# Patient Record
Sex: Male | Born: 1965 | Race: White | Hispanic: No | Marital: Single | Smoking: Current every day smoker
Health system: Southern US, Community
[De-identification: ages and names within clinical notes are randomized; demographics above are authoritative.]

## PROBLEM LIST (undated history)

## (undated) DIAGNOSIS — E669 Obesity, unspecified: Secondary | ICD-10-CM

## (undated) DIAGNOSIS — K219 Gastro-esophageal reflux disease without esophagitis: Secondary | ICD-10-CM

## (undated) DIAGNOSIS — F1010 Alcohol abuse, uncomplicated: Secondary | ICD-10-CM

## (undated) DIAGNOSIS — I2510 Atherosclerotic heart disease of native coronary artery without angina pectoris: Secondary | ICD-10-CM

## (undated) DIAGNOSIS — I5042 Chronic combined systolic (congestive) and diastolic (congestive) heart failure: Secondary | ICD-10-CM

## (undated) DIAGNOSIS — I214 Non-ST elevation (NSTEMI) myocardial infarction: Secondary | ICD-10-CM

## (undated) DIAGNOSIS — M109 Gout, unspecified: Secondary | ICD-10-CM

## (undated) DIAGNOSIS — I255 Ischemic cardiomyopathy: Secondary | ICD-10-CM

## (undated) DIAGNOSIS — I10 Essential (primary) hypertension: Secondary | ICD-10-CM

## (undated) DIAGNOSIS — Z720 Tobacco use: Secondary | ICD-10-CM

## (undated) HISTORY — DX: Alcohol abuse, uncomplicated: F10.10

## (undated) HISTORY — DX: Chronic combined systolic (congestive) and diastolic (congestive) heart failure: I50.42

## (undated) HISTORY — PX: TOOTH EXTRACTION: SUR596

## (undated) HISTORY — DX: Ischemic cardiomyopathy: I25.5

---

## 2007-07-27 ENCOUNTER — Ambulatory Visit: Payer: Self-pay | Admitting: Internal Medicine

## 2007-07-27 ENCOUNTER — Inpatient Hospital Stay (HOSPITAL_COMMUNITY): Admission: EM | Admit: 2007-07-27 | Discharge: 2007-07-29 | Payer: Self-pay | Admitting: Emergency Medicine

## 2007-07-29 ENCOUNTER — Encounter (INDEPENDENT_AMBULATORY_CARE_PROVIDER_SITE_OTHER): Payer: Self-pay | Admitting: Emergency Medicine

## 2009-12-12 IMAGING — CR DG CHEST 2V
1 series · 1 of 1 positions shown · non-contrast
Comparison: None

CLINICAL DATA: Chest pain, shortness of breath

CHEST - 2 VIEW:

[w chest lat]
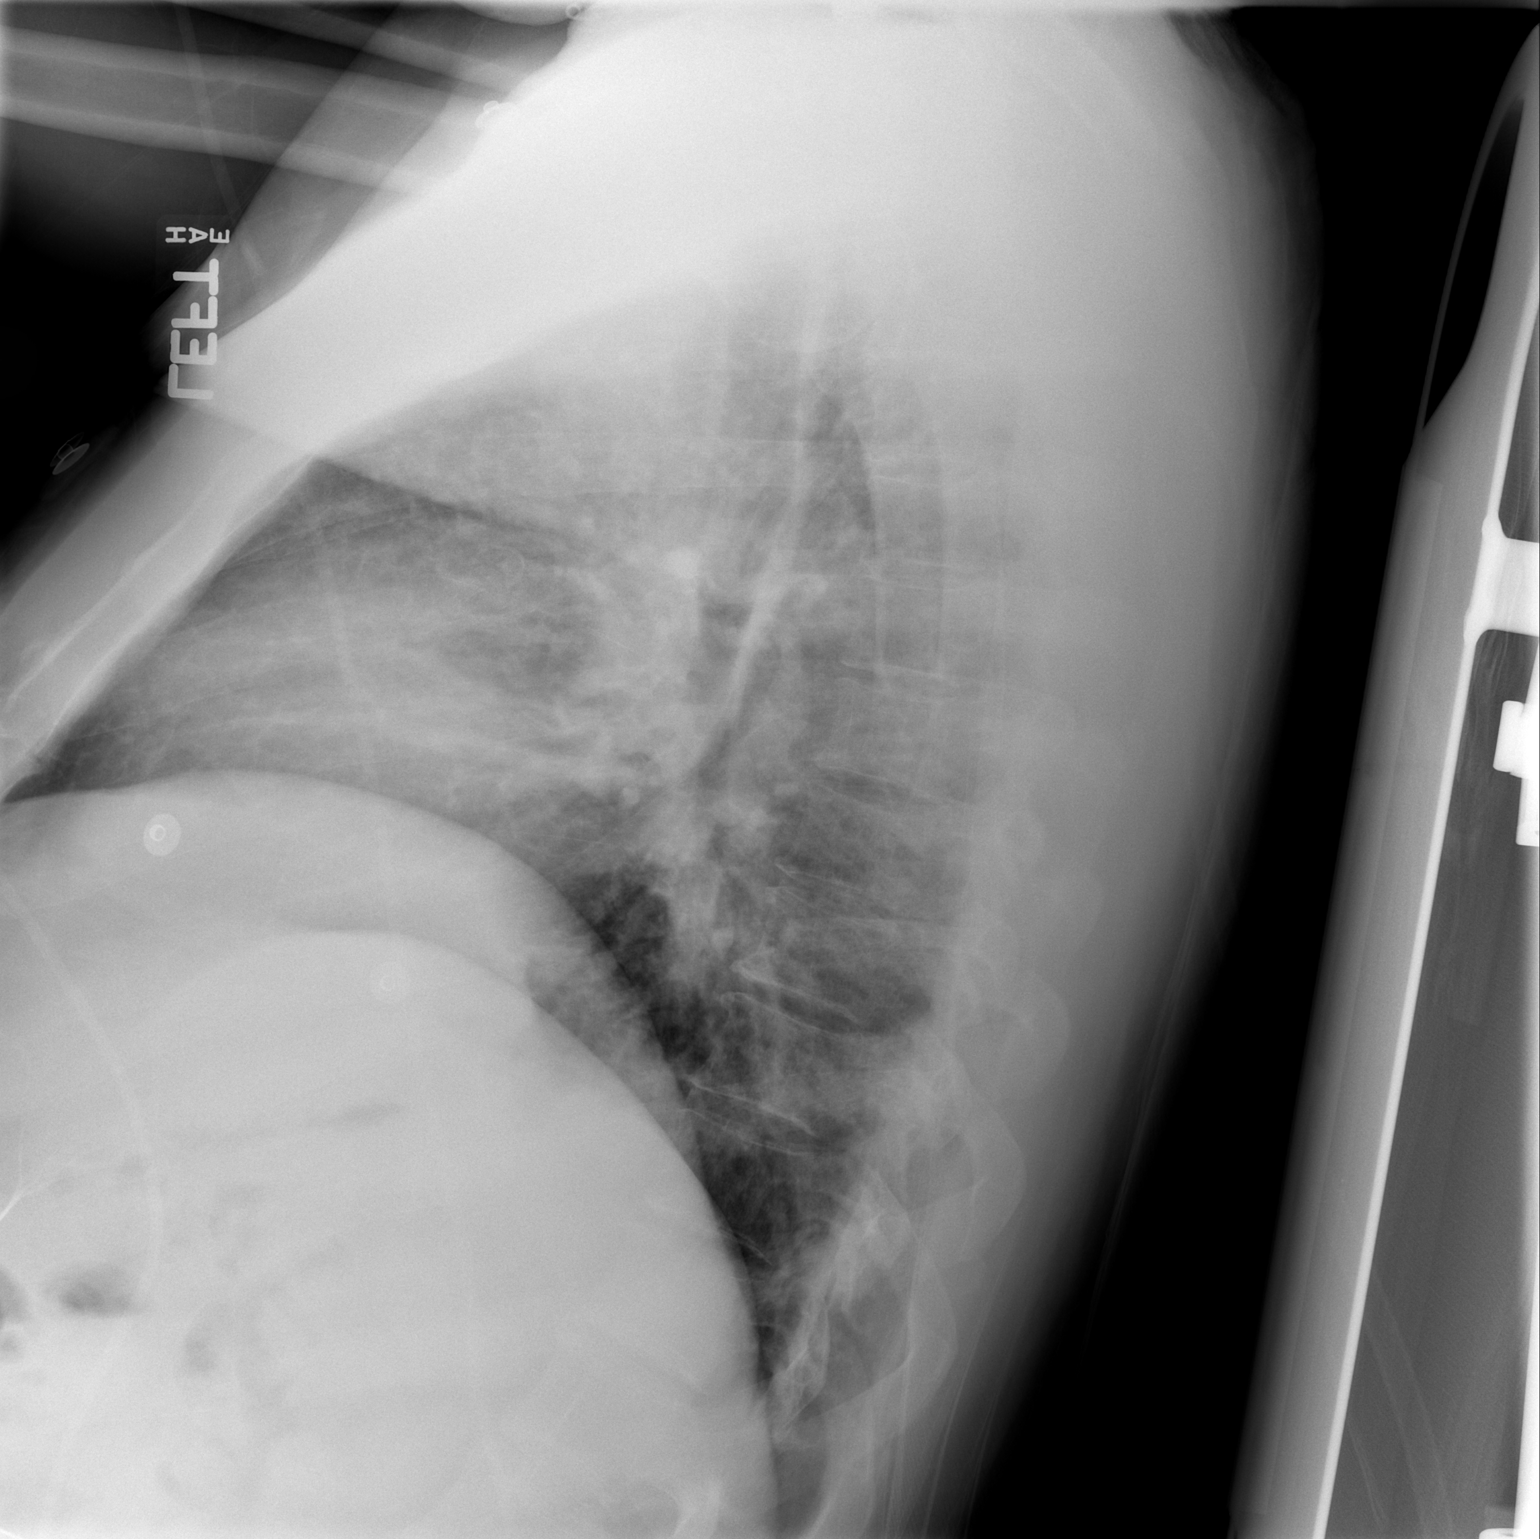

[1 of 1 positions shown; findings below may reference images not displayed]

FINDINGS: Heart and mediastinal contours are within normal limits. Lungs are
clear. No focal opacities or effusions. Low lung volumes. No acute bony
abnormality.
IMPRESSION: No active disease. Low volumes.

## 2010-10-07 NOTE — Discharge Summary (Signed)
Bradley Bridges, POSTEN NO.:  [id number]   MEDICAL RECORD NO.:  [id number]          PATIENT TYPE:  INP   LOCATION:  4735                         FACILITY:  [facility]   PHYSICIAN:  Alvester Morin, M.D.  DATE OF BIRTH:  [birth year]   DATE OF ADMISSION:  07/27/2007  DATE OF DISCHARGE:  07/29/2007                               DISCHARGE SUMMARY   DISCHARGE DIAGNOSES:  1. Chest pain secondary to pneumonia.  2. Prolonged QT.  3. Polysubstance abuse including alcohol, tobacco, and cocaine.  4. Hypertension.  5. Dyslipidemia.  6. Gastroesophageal reflux disease.   DISCHARGE MEDICATIONS:  Amlodipine 10 mg 1 tablet by mouth daily,  hydrochlorothiazide 25 mg 1 tablet by mouth daily, Prilosec 20 mg 1  tablet by mouth twice a day, doxycycline 100 mg 1 tablet by mouth twice  a day for 14 days.   DISPOSITION ON FOLLOWUP:  The patient was discharged home in stable  condition with a followup by his primary care physician, Dr. Loma Sender, on August 12, 2007, at 11:00 a.m.  Dr. Vear Clock' office phone  number is [phone].  It will be important to check the bottles of  the medications that the patient is using in order to confirm that he is  taking the appropriate medication and the appropriate doses.  A BMET  should be checked to in order to evaluate renal function and  electrolytes status since the patient was recently started on  hydrochlorothiazide.  Blood pressure should be checked and add another  medication if necessary.  It will be important also to examine him and  check that the patient completes the antibiotic course.   PROCEDURES PERFORMED:  Acute abdominal series demonstrated new left  basilar opacity compatible with pneumonia.  No obstruction or free air  noticed in the abdomen.  Chest x-ray showed  clear lungs.  2D ECHO showed normal left ventricular systolic function.  Ventricular  ejection fraction 55%.  There was no diagnostic evidence of left  ventricular regional wall motion abnormalities.  Left ventricle wall  thickness was mildly increased consistently with hypertension.  There  was mild asymmetric septal hypertrophy also consistent with  hypertension.   CONSULTATIONS:  No consultations were made during this admission.   HISTORY OF PRESENT ILLNESS AND PHYSICAL EXAM AND LABORATORY DATA:  Mr.  Bridges is a 45 years old male with prior medical history of  hypertension, hyperlipidemia, tobacco abuse, alcohol abuse,  gastroesophageal reflux disease, and a family history of myocardial  infarction before age 22 who came to the emergency department  complaining of chest pain, 10/10 in intensity, constant, localized on  his left lower part of the chest, nonradiating, aggravated by deep  inspiration, alleviated by nitroglycerin. The pain was started the night  prior to admission after an episode of emesis.  The patient reports that  the pain is associated with diaphoresis, nausea, and vomiting.  The  patient also endorses feeling cold symptoms for the last 5 days prior to  admission including fever, cough, myalgias, and general malaise.  He has  been using Tylenol, NyQuil, and DayQuil  to treat the symptoms without  any apparent improvement of them.   PHYSICAL EXAMINATION:  VITAL SIGNS:  Temperature of 108, blood pressure  129/76, heart rate 96, respiratory rate 18, oxygen saturation 94% on  room air, 97% on 2L.  GENERAL:  The patient was in no acute distress.  HEENT:  Eyes PERRLA.  No icterus.  Extraocular muscles intact.  ENT  showed edematous oropharynx without exudates and dry oral mucous  membrane.  NECK:  Supple, without thyromegaly.  No bruits.  RESPIRATORY:  Clear to auscultation bilateral with mild decreased breath  sound diffusely especially towards bases.  CARDIOVASCULAR:  Regular rate and rhythm.  No murmurs, gallops, or rubs.  GASTROINTESTINAL:  Soft, a little bit obese.  Nontender and  nondistended.  Positive bowel  sounds.  EXTREMITIES:  Without edema, cyanosis, or clubbing.  Good pulses  bilateral and symmetrical.  SKIN:  The patient was a little bit flush especially on his face, but no  lesion or rash were noted.  No lymphadenopathy present, 5/5 in all  extremities regarding strength with full range of motion.  NEUROLOGIC:  The patient was alert, awake, and oriented x3.  No cranial  nerve deficit on the exam.  Appropriate affect and normal finger-to-  nose.   LABORATORY DATA:  Sodium of 136, potassium 3.9, chloride 106, bicarb 21,  BUN 5, glucose 105, creatinine 1.3, and anion gap 9.  White blood cells  13.6, hemoglobin 16.0, hematocrit 44.7, platelets 189, ANC 11.4, MCV  95.5.  Cardiac markers point of care demonstrated a myoglobin of 150, CK-  MB less than 1, troponin less than 0.05.   Chest x-ray, no acute disease.  Alcohol level was less than on 5,  influenza A and B was negative.  The patient's UDS was positive for  opioids and positive for cocaine.  TSH 1.204, hemoglobin A1c is 5.8.  Lipid profile demonstrating a cholesterol of 151 total, triglycerides of  137,  HDL of 29 and LDL of 95.  D-dimer 0.22, bilirubin 1.1, alkaline  phosphatase 47, AST 67, ALT 48, total protein 6.7, albumin 3.3, calcium  8.3, and lipase 26.   HOSPITAL COURSE BY PROBLEM:  1. Chest pain, secondary to pneumonia.  The patient was admitted to      telemetry.  Cardiac enzymes and troponins were negative.  Serial      EKGs showed no findings significant for ischemia.  D-dimer was      negative.  2D ECHO showed no regional wall abnormality (results      above).  Repeat CXR the day after admission showed a left lower      lobe pneumonia. The patient was started on Rocephin and Zithromax      and he improved symptomatically. He was discharged on doxycycline      100 mg twice a day for 14 days.  2. Polysubstance abuse.  The patient received smoking cessation      counseling.  He also received a nicotine patch after  ruling out      coronary artery disease.  He also received alcohol and cocaine      cessation counseling.  He received thiamine and folic acid during      the hospitalization.  At the moment of discharge, the patient was      instructed to continue with the multivitamins, and he received      complete information package in order for him to attend AA      meetings.  3. Hypertension.  The patient was on no blood pressure medication on      admission. He had elevated blood pressures throughout his      hospitalization and was started on amlodipine and      hydrochlorothiazide.  He will followup with his primary care      physician in order to adjust the doses and add any other medication      needed to control his blood pressure.  4. Gastroesophageal reflux disease.  The patient was placed on      Protonix 40 mg daily and then he was discharged using Prilosec 20      mg twice a day in order to avoid any reflux symptoms.  5. Dyslipidemia.  Fasting lipid profile showed an HDL of 29.  He will      follow up with his primary care physician to discuss treatment.  6. Fecal occult blood test positive  Patient will need a colonoscopy      as an outpatient.   At discharge, the patient's vital signs were blood pressure 150/110,  oxygen saturation 98, heart rate 82, respiratory rate 19, and  temperature 98.3.  At the moment of discharge his lab were sodium 132,  potassium 3.8, chloride 108, bicarbonate 24, glucose 83, BUN 5,  creatinine 1.02, and calcium 9.1.      Rosanna Randy, MD  Electronically Signed      Alvester Morin, M.D.  Electronically Signed    CEM/MEDQ  D:  08/22/2007  T:  08/22/2007  Job:  [id number]   cc:   Loma Sender

## 2011-02-13 LAB — CBC
HCT: 40.1
HCT: 44.7
Hemoglobin: 13.8
MCHC: 35.2
MCV: 96.7
Platelets: 147 — ABNORMAL LOW
Platelets: 189
RBC: 3.93 — ABNORMAL LOW
RDW: 13.7
RDW: 13.8
WBC: 13.6 — ABNORMAL HIGH

## 2011-02-13 LAB — RAPID URINE DRUG SCREEN, HOSP PERFORMED
Amphetamines: NOT DETECTED
Benzodiazepines: NOT DETECTED
Cocaine: POSITIVE — AB
Tetrahydrocannabinol: NOT DETECTED

## 2011-02-13 LAB — DIFFERENTIAL
Basophils Absolute: 0
Eosinophils Relative: 0
Lymphocytes Relative: 23
Lymphocytes Relative: 6 — ABNORMAL LOW
Lymphs Abs: 0.8
Monocytes Absolute: 0.6
Neutro Abs: 11.4 — ABNORMAL HIGH
Neutrophils Relative %: 84 — ABNORMAL HIGH

## 2011-02-13 LAB — URINALYSIS, MICROSCOPIC ONLY
Ketones, ur: NEGATIVE
Leukocytes, UA: NEGATIVE
Nitrite: NEGATIVE
Protein, ur: NEGATIVE
Urobilinogen, UA: 1

## 2011-02-13 LAB — EXPECTORATED SPUTUM ASSESSMENT W GRAM STAIN, RFLX TO RESP C

## 2011-02-13 LAB — COMPREHENSIVE METABOLIC PANEL
ALT: 48
Albumin: 3.3 — ABNORMAL LOW
Alkaline Phosphatase: 47
BUN: 5 — ABNORMAL LOW
Chloride: 105
Glucose, Bld: 106 — ABNORMAL HIGH
Potassium: 4.1
Sodium: 139
Total Bilirubin: 1.1
Total Protein: 6.7

## 2011-02-13 LAB — CULTURE, BLOOD (ROUTINE X 2): Culture: NO GROWTH

## 2011-02-13 LAB — BASIC METABOLIC PANEL
BUN: 9
CO2: 24
CO2: 24
Calcium: 8.6
Creatinine, Ser: 1.21
GFR calc Af Amer: >60
GFR calc non Af Amer: >60
Glucose, Bld: 83
Potassium: 3.8
Sodium: 142

## 2011-02-13 LAB — POCT CARDIAC MARKERS
CKMB, poc: <1 — ABNORMAL LOW
Myoglobin, poc: 150
Operator id: 295131
Troponin i, poc: <0.05

## 2011-02-13 LAB — HEMOGLOBIN A1C: Hgb A1c MFr Bld: 5.8

## 2011-02-13 LAB — I-STAT 8, (EC8 V) (CONVERTED LAB)
BUN: 5 — ABNORMAL LOW
Bicarbonate: 20.8
Glucose, Bld: 105 — ABNORMAL HIGH
TCO2: 22
pH, Ven: 7.41 — ABNORMAL HIGH

## 2011-02-13 LAB — ETHANOL: Alcohol, Ethyl (B): 5

## 2011-02-13 LAB — LIPID PANEL
HDL: 29 — ABNORMAL LOW
Total CHOL/HDL Ratio: 5.2
VLDL: 27

## 2011-02-13 LAB — CK TOTAL AND CKMB (NOT AT ARMC)
CK, MB: 1.5
Relative Index: 0.8
Total CK: 152

## 2011-02-13 LAB — INFLUENZA A+B VIRUS AG-DIRECT(RAPID)
Inflenza A Ag: NEGATIVE
Influenza B Ag: NEGATIVE

## 2011-02-13 LAB — CULTURE, RESPIRATORY W GRAM STAIN: Culture: NORMAL

## 2011-02-13 LAB — URINE CULTURE: Colony Count: 10000

## 2011-02-13 LAB — TSH: TSH: 1.204

## 2011-02-13 LAB — POCT I-STAT CREATININE: Operator id: 133351

## 2011-02-13 LAB — PROTIME-INR
INR: 1
Prothrombin Time: 13.7

## 2011-02-13 LAB — OCCULT BLOOD X 1 CARD TO LAB, STOOL: Fecal Occult Bld: POSITIVE

## 2011-02-13 LAB — TROPONIN I: Troponin I: 0.03

## 2014-05-02 ENCOUNTER — Emergency Department: Payer: Self-pay | Admitting: Emergency Medicine

## 2014-05-02 LAB — CBC
HCT: 45.5 % (ref 40.0–52.0)
HGB: 15.4 g/dL (ref 13.0–18.0)
MCH: 33 pg (ref 26.0–34.0)
MCHC: 33.8 g/dL (ref 32.0–36.0)
MCV: 98 fL (ref 80–100)
PLATELETS: 511 10*3/uL — AB (ref 150–440)
RBC: 4.66 10*6/uL (ref 4.40–5.90)
RDW: 13.2 % (ref 11.5–14.5)
WBC: 14.1 10*3/uL — AB (ref 3.8–10.6)

## 2014-05-02 LAB — BASIC METABOLIC PANEL
ANION GAP: 10 (ref 7–16)
BUN: 10 mg/dL (ref 7–18)
CALCIUM: 9.5 mg/dL (ref 8.5–10.1)
CHLORIDE: 99 mmol/L (ref 98–107)
CO2: 26 mmol/L (ref 21–32)
Creatinine: 1.09 mg/dL (ref 0.60–1.30)
GLUCOSE: 95 mg/dL (ref 65–99)
Osmolality: 269 (ref 275–301)
POTASSIUM: 3.4 mmol/L — AB (ref 3.5–5.1)
Sodium: 135 mmol/L — ABNORMAL LOW (ref 136–145)

## 2014-05-02 LAB — TROPONIN I: Troponin-I: <0.02 ng/mL

## 2014-05-02 LAB — PRO B NATRIURETIC PEPTIDE: B-Type Natriuretic Peptide: 138 pg/mL — ABNORMAL HIGH (ref 0–125)

## 2014-05-03 LAB — HEPATIC FUNCTION PANEL A (ARMC)
ALBUMIN: 3.4 g/dL (ref 3.4–5.0)
AST: 28 U/L (ref 15–37)
Alkaline Phosphatase: 98 U/L
Bilirubin, Direct: 0.6 mg/dL — ABNORMAL HIGH (ref 0.0–0.2)
Bilirubin,Total: 1.2 mg/dL — ABNORMAL HIGH (ref 0.2–1.0)
SGPT (ALT): 15 U/L
TOTAL PROTEIN: 8.1 g/dL (ref 6.4–8.2)

## 2014-05-03 LAB — ETHANOL: Ethanol: <3 mg/dL

## 2014-05-03 LAB — TROPONIN I: Troponin-I: <0.02 ng/mL

## 2014-05-03 LAB — LIPASE, BLOOD: LIPASE: 227 U/L (ref 73–393)

## 2016-09-17 IMAGING — CR DG CHEST 2V
1 series · 2 of 2 positions shown · non-contrast
Comparison: None.

CLINICAL DATA: Chest pain for 1 day.  Shortness of breath.  Cough

EXAM:
CHEST  2 VIEW

[Series 1: dxr chest pa (or ap) and lateral · 0.14mm/px · 2 of 2 slices shown]
[im 1/2]
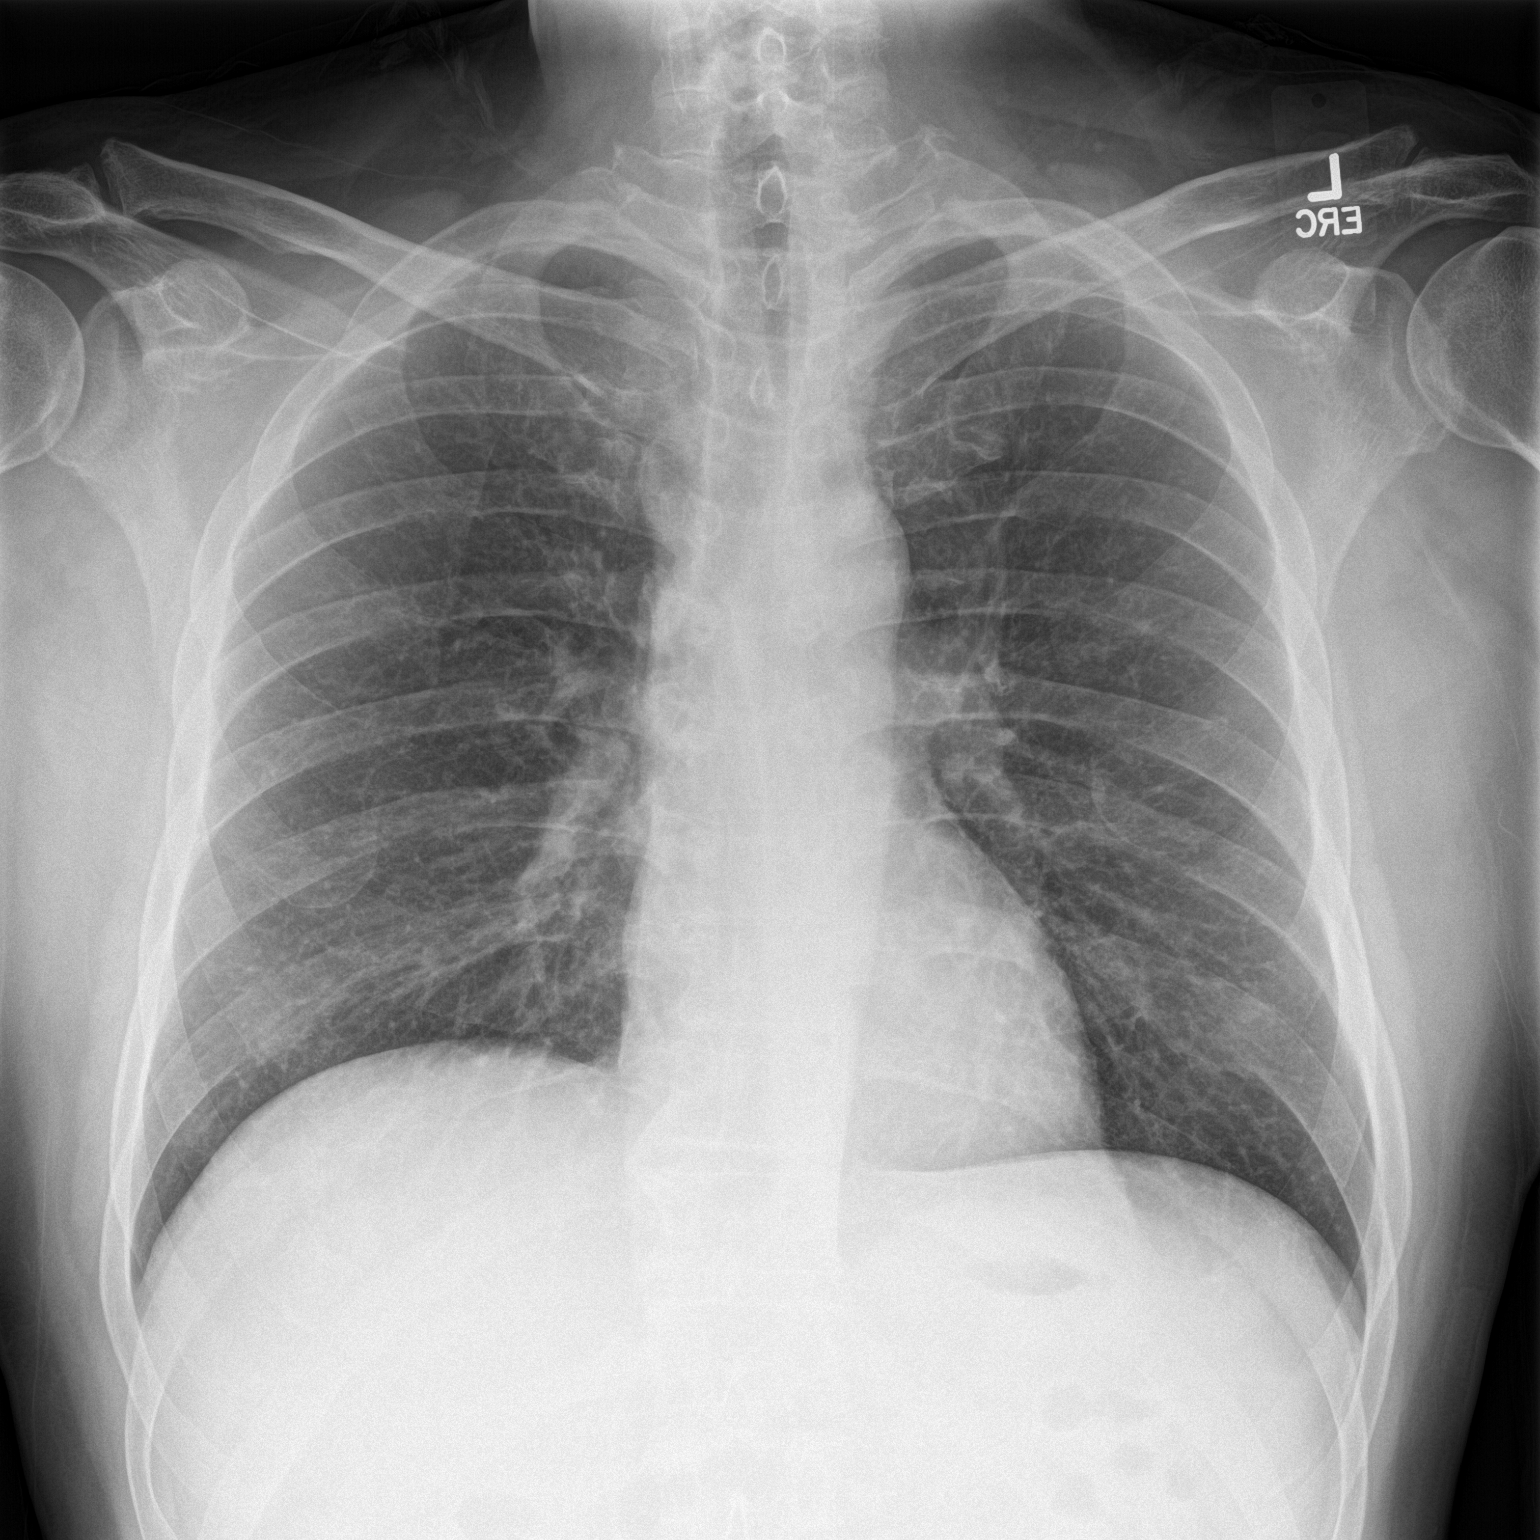
[im 2/2]
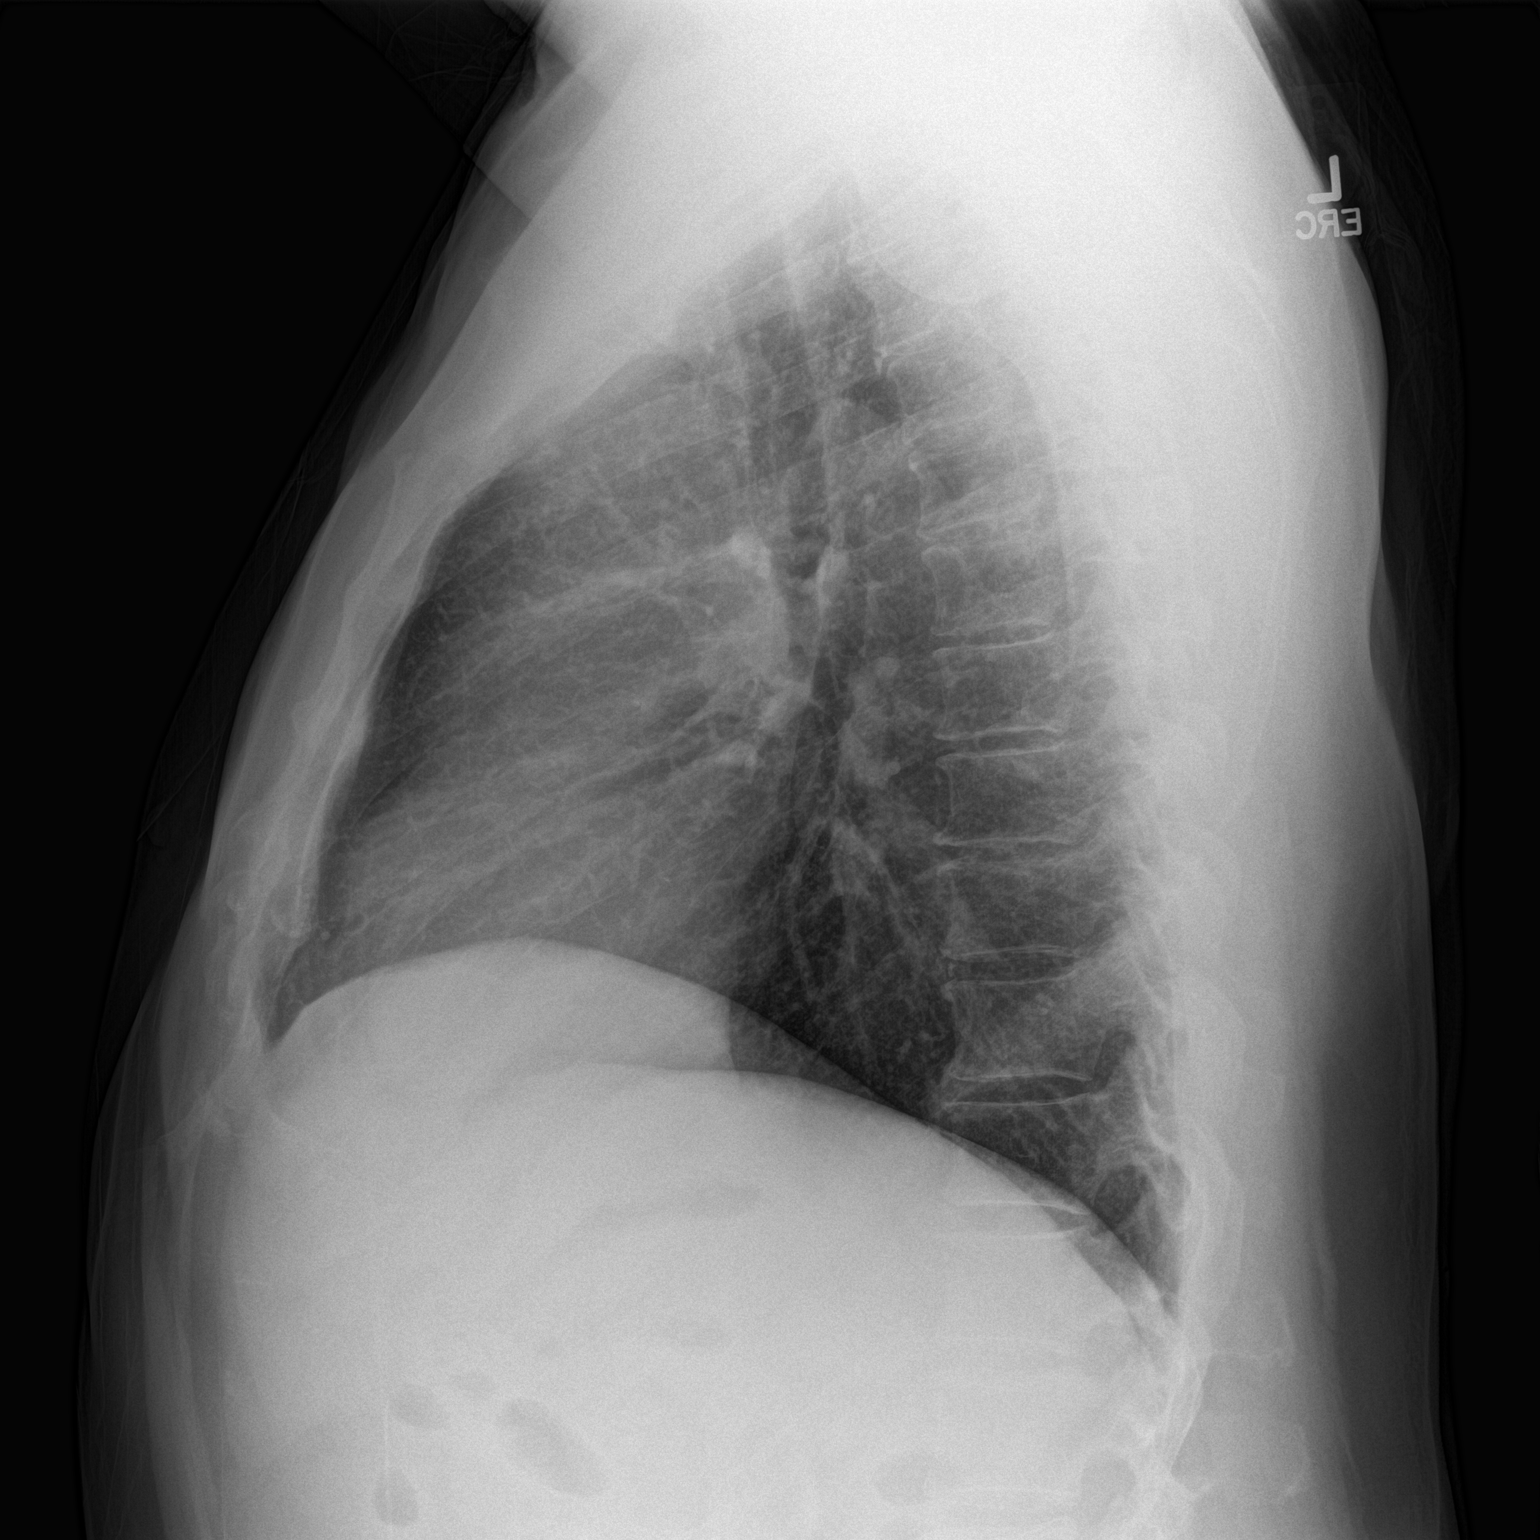

[2 of 2 positions shown; findings below may reference images not displayed]

FINDINGS: The heart size and mediastinal contours are within normal limits.
Both lungs are clear. The visualized skeletal structures are
unremarkable.
IMPRESSION: No active cardiopulmonary disease.

## 2017-02-26 ENCOUNTER — Inpatient Hospital Stay
Admission: EM | Admit: 2017-02-26 | Discharge: 2017-02-27 | DRG: 281 | Disposition: A | Discharge status: Other Acute Inpatient Hospital | Payer: Medicaid Other | Attending: Internal Medicine | Admitting: Internal Medicine

## 2017-02-26 ENCOUNTER — Encounter: Payer: Self-pay | Admitting: *Deleted

## 2017-02-26 ENCOUNTER — Emergency Department: Payer: Medicaid Other

## 2017-02-26 DIAGNOSIS — D72829 Elevated white blood cell count, unspecified: Secondary | ICD-10-CM | POA: Diagnosis present

## 2017-02-26 DIAGNOSIS — F17210 Nicotine dependence, cigarettes, uncomplicated: Secondary | ICD-10-CM | POA: Diagnosis present

## 2017-02-26 DIAGNOSIS — Z6831 Body mass index (BMI) 31.0-31.9, adult: Secondary | ICD-10-CM | POA: Diagnosis not present

## 2017-02-26 DIAGNOSIS — I214 Non-ST elevation (NSTEMI) myocardial infarction: Secondary | ICD-10-CM | POA: Diagnosis present

## 2017-02-26 DIAGNOSIS — F1010 Alcohol abuse, uncomplicated: Secondary | ICD-10-CM | POA: Diagnosis present

## 2017-02-26 DIAGNOSIS — N179 Acute kidney failure, unspecified: Secondary | ICD-10-CM | POA: Diagnosis present

## 2017-02-26 DIAGNOSIS — R1013 Epigastric pain: Secondary | ICD-10-CM | POA: Diagnosis not present

## 2017-02-26 DIAGNOSIS — R0602 Shortness of breath: Secondary | ICD-10-CM

## 2017-02-26 DIAGNOSIS — E669 Obesity, unspecified: Secondary | ICD-10-CM | POA: Diagnosis present

## 2017-02-26 DIAGNOSIS — Z880 Allergy status to penicillin: Secondary | ICD-10-CM

## 2017-02-26 DIAGNOSIS — Z8249 Family history of ischemic heart disease and other diseases of the circulatory system: Secondary | ICD-10-CM | POA: Diagnosis not present

## 2017-02-26 DIAGNOSIS — M109 Gout, unspecified: Secondary | ICD-10-CM | POA: Diagnosis present

## 2017-02-26 DIAGNOSIS — I10 Essential (primary) hypertension: Secondary | ICD-10-CM | POA: Diagnosis present

## 2017-02-26 DIAGNOSIS — K219 Gastro-esophageal reflux disease without esophagitis: Secondary | ICD-10-CM | POA: Diagnosis present

## 2017-02-26 DIAGNOSIS — Z833 Family history of diabetes mellitus: Secondary | ICD-10-CM | POA: Diagnosis not present

## 2017-02-26 HISTORY — DX: Gastro-esophageal reflux disease without esophagitis: K21.9

## 2017-02-26 HISTORY — DX: Essential (primary) hypertension: I10

## 2017-02-26 HISTORY — DX: Obesity, unspecified: E66.9

## 2017-02-26 HISTORY — DX: Gout, unspecified: M10.9

## 2017-02-26 HISTORY — DX: Tobacco use: Z72.0

## 2017-02-26 LAB — BASIC METABOLIC PANEL
ANION GAP: 12 (ref 5–15)
BUN: 17 mg/dL (ref 6–20)
CO2: 27 mmol/L (ref 22–32)
Calcium: 9.9 mg/dL (ref 8.9–10.3)
Chloride: 95 mmol/L — ABNORMAL LOW (ref 101–111)
Creatinine, Ser: 1.31 mg/dL — ABNORMAL HIGH (ref 0.61–1.24)
GLUCOSE: 134 mg/dL — AB (ref 65–99)
Potassium: 5 mmol/L (ref 3.5–5.1)
SODIUM: 134 mmol/L — AB (ref 135–145)

## 2017-02-26 LAB — URINALYSIS, COMPLETE (UACMP) WITH MICROSCOPIC
BACTERIA UA: NONE SEEN
BILIRUBIN URINE: NEGATIVE
GLUCOSE, UA: NEGATIVE mg/dL
HGB URINE DIPSTICK: NEGATIVE
Ketones, ur: NEGATIVE mg/dL
LEUKOCYTES UA: NEGATIVE
NITRITE: NEGATIVE
PH: 5 (ref 5.0–8.0)
Protein, ur: 100 mg/dL — AB
SPECIFIC GRAVITY, URINE: 1.023 (ref 1.005–1.030)
Squamous Epithelial / LPF: NONE SEEN

## 2017-02-26 LAB — LIPID PANEL
CHOL/HDL RATIO: 4.5 ratio
CHOLESTEROL: 196 mg/dL (ref 0–200)
HDL: 44 mg/dL (ref >40)
LDL CALC: 108 mg/dL — AB (ref 0–99)
Triglycerides: 218 mg/dL — ABNORMAL HIGH (ref <150)
VLDL: 44 mg/dL — AB (ref 0–40)

## 2017-02-26 LAB — HEPARIN LEVEL (UNFRACTIONATED)

## 2017-02-26 LAB — TROPONIN I
Troponin I: 13.83 ng/mL (ref <0.03)
Troponin I: 16.08 ng/mL (ref <0.03)

## 2017-02-26 LAB — CBC
HCT: 42.5 % (ref 40.0–52.0)
HEMOGLOBIN: 14.4 g/dL (ref 13.0–18.0)
MCH: 31.1 pg (ref 26.0–34.0)
MCHC: 33.9 g/dL (ref 32.0–36.0)
MCV: 91.7 fL (ref 80.0–100.0)
Platelets: 383 10*3/uL (ref 150–440)
RBC: 4.63 MIL/uL (ref 4.40–5.90)
RDW: 13 % (ref 11.5–14.5)
WBC: 17.1 10*3/uL — AB (ref 3.8–10.6)

## 2017-02-26 LAB — URINE DRUG SCREEN, QUALITATIVE (ARMC ONLY)
Amphetamines, Ur Screen: NOT DETECTED
BARBITURATES, UR SCREEN: NOT DETECTED
BENZODIAZEPINE, UR SCRN: NOT DETECTED
Cannabinoid 50 Ng, Ur ~~LOC~~: NOT DETECTED
Cocaine Metabolite,Ur ~~LOC~~: NOT DETECTED
MDMA (Ecstasy)Ur Screen: NOT DETECTED
METHADONE SCREEN, URINE: NOT DETECTED
OPIATE, UR SCREEN: POSITIVE — AB
Phencyclidine (PCP) Ur S: NOT DETECTED
TRICYCLIC, UR SCREEN: NOT DETECTED

## 2017-02-26 LAB — PROTIME-INR
INR: 1.08
Prothrombin Time: 13.9 seconds (ref 11.4–15.2)

## 2017-02-26 LAB — APTT: aPTT: 33 seconds (ref 24–36)

## 2017-02-26 MED ORDER — HYDRALAZINE HCL 20 MG/ML IJ SOLN: 10.0000 mg | Freq: Four times a day (QID) | INTRAMUSCULAR | Status: DC | PRN

## 2017-02-26 MED ORDER — NITROGLYCERIN 2 % TD OINT
1.0000 [in_us] | TOPICAL_OINTMENT | Freq: Four times a day (QID) | TRANSDERMAL | Status: DC
  Administered 2017-02-26: 1 [in_us] via TOPICAL
  Filled 2017-02-26: qty 1

## 2017-02-26 MED ORDER — METOPROLOL TARTRATE 25 MG PO TABS
25.0000 mg | ORAL_TABLET | Freq: Two times a day (BID) | ORAL | Status: DC
  Administered 2017-02-26 – 2017-02-27 (×2): 25 mg via ORAL
  Filled 2017-02-26 (×2): qty 1

## 2017-02-26 MED ORDER — ATORVASTATIN CALCIUM 20 MG PO TABS
40.0000 mg | ORAL_TABLET | Freq: Every day | ORAL | Status: DC
  Administered 2017-02-26: 40 mg via ORAL
  Filled 2017-02-26: qty 2

## 2017-02-26 MED ORDER — ONDANSETRON HCL 4 MG/2ML IJ SOLN: 4.0000 mg | Freq: Once | INTRAMUSCULAR | Status: DC

## 2017-02-26 MED ORDER — MORPHINE SULFATE (PF) 2 MG/ML IV SOLN: 2.0000 mg | INTRAVENOUS | Status: DC | PRN

## 2017-02-26 MED ORDER — ASPIRIN 81 MG PO CHEW
81.0000 mg | CHEWABLE_TABLET | Freq: Every day | ORAL | Status: DC
Start: 2017-02-27 — End: 2017-02-27
  Administered 2017-02-27: 81 mg via ORAL
  Filled 2017-02-26: qty 1

## 2017-02-26 MED ORDER — ONDANSETRON HCL 4 MG/2ML IJ SOLN: 4.0000 mg | Freq: Four times a day (QID) | INTRAMUSCULAR | Status: DC | PRN

## 2017-02-26 MED ORDER — ONDANSETRON HCL 4 MG PO TABS: 4.0000 mg | ORAL_TABLET | Freq: Four times a day (QID) | ORAL | Status: DC | PRN

## 2017-02-26 MED ORDER — ACETAMINOPHEN 650 MG RE SUPP: 650.0000 mg | Freq: Four times a day (QID) | RECTAL | Status: DC | PRN

## 2017-02-26 MED ORDER — MORPHINE SULFATE (PF) 4 MG/ML IV SOLN
4.0000 mg | Freq: Once | INTRAVENOUS | Status: AC
  Administered 2017-02-26: 4 mg via INTRAVENOUS
  Filled 2017-02-26: qty 1

## 2017-02-26 MED ORDER — ASPIRIN 81 MG PO CHEW
324.0000 mg | CHEWABLE_TABLET | Freq: Once | ORAL | Status: AC
  Administered 2017-02-26: 324 mg via ORAL
  Filled 2017-02-26: qty 4

## 2017-02-26 MED ORDER — SODIUM CHLORIDE 0.9 % IV SOLN
INTRAVENOUS | Status: DC
  Administered 2017-02-26: 21:00:00 via INTRAVENOUS

## 2017-02-26 MED ORDER — HEPARIN (PORCINE) IN NACL 100-0.45 UNIT/ML-% IJ SOLN
1600.0000 [IU]/h | INTRAMUSCULAR | Status: DC
  Administered 2017-02-26: 1400 [IU]/h via INTRAVENOUS
  Administered 2017-02-27: 1600 [IU]/h via INTRAVENOUS
  Filled 2017-02-26 (×3): qty 250

## 2017-02-26 MED ORDER — NITROGLYCERIN 0.4 MG SL SUBL
0.4000 mg | SUBLINGUAL_TABLET | SUBLINGUAL | Status: DC | PRN
  Administered 2017-02-26 (×3): 0.4 mg via SUBLINGUAL
  Filled 2017-02-26: qty 1

## 2017-02-26 MED ORDER — HEPARIN BOLUS VIA INFUSION
4000.0000 [IU] | Freq: Once | INTRAVENOUS | Status: AC
  Administered 2017-02-26: 4000 [IU] via INTRAVENOUS
  Filled 2017-02-26: qty 4000

## 2017-02-26 MED ORDER — PANTOPRAZOLE SODIUM 40 MG PO TBEC
40.0000 mg | DELAYED_RELEASE_TABLET | Freq: Every day | ORAL | Status: DC
  Administered 2017-02-26 – 2017-02-27 (×2): 40 mg via ORAL
  Filled 2017-02-26 (×2): qty 1

## 2017-02-26 MED ORDER — ACETAMINOPHEN 325 MG PO TABS: 650.0000 mg | ORAL_TABLET | Freq: Four times a day (QID) | ORAL | Status: DC | PRN

## 2017-02-26 MED ORDER — GI COCKTAIL ~~LOC~~
30.0000 mL | Freq: Once | ORAL | Status: AC
  Administered 2017-02-27: 30 mL via ORAL
  Filled 2017-02-26 (×2): qty 30

## 2017-02-26 MED ORDER — OXYCODONE HCL 5 MG PO TABS: 5.0000 mg | ORAL_TABLET | ORAL | Status: DC | PRN

## 2017-02-26 MED ORDER — ONDANSETRON HCL 4 MG/2ML IJ SOLN
4.0000 mg | Freq: Once | INTRAMUSCULAR | Status: AC
  Administered 2017-02-26: 4 mg via INTRAVENOUS
  Filled 2017-02-26: qty 2

## 2017-02-26 MED ORDER — NICOTINE 21 MG/24HR TD PT24
21.0000 mg | MEDICATED_PATCH | Freq: Every day | TRANSDERMAL | Status: DC
  Filled 2017-02-26: qty 1

## 2017-02-26 MED ORDER — LOSARTAN POTASSIUM 50 MG PO TABS
50.0000 mg | ORAL_TABLET | Freq: Every day | ORAL | Status: DC
  Administered 2017-02-26: 50 mg via ORAL
  Filled 2017-02-26: qty 1

## 2017-02-26 NOTE — Progress Notes (Signed)
Pt states still having pain, but in his upper abdomen, not his chest. Pt has had morphine, that did not fully relieve pain, so MD paged to see if I can't get a GI cocktail for pt. Dr. Anne Hahn to put orders in.

## 2017-02-26 NOTE — ED Notes (Signed)
Heparin administration verified by Stephen, RN. 

## 2017-02-26 NOTE — Progress Notes (Signed)
ANTICOAGULATION CONSULT NOTE - Initial Consult  Pharmacy Consult for heparin gtt Indication: chest pain/ACS  Allergies  Allergen Reactions  . Penicillins Rash    Patient Measurements: Height:  (185.4 cm) Weight: 238 lb (108 kg) IBW/kg (Calculated) : 79.9 Heparin Dosing Weight: 102.3kg  Vital Signs: Temp: 98.2 F (36.8 C) (10/08 1716) Temp Source: Oral (10/08 1716) BP: 172/109 (10/08 1800) Pulse Rate: 93 (10/08 1800)  Labs:  Recent Labs  02/26/17 1723  HGB 14.4  HCT 42.5  PLT 383  CREATININE 1.31*  TROPONINI 13.83*    Estimated Creatinine Clearance: 86.9 mL/min (A) (by C-G formula based on SCr of 1.31 mg/dL (H)).   Medical History: No past medical history on file.  Medications:   (Not in a hospital admission) Scheduled:  . heparin  4,000 Units Intravenous Once   Infusions:  . heparin     PRN: nitroGLYCERIN Anti-infectives    None      Assessment: 51 year old male, pharmacy consulted for ACS/STEMI per protocol heparin gtt.  Goal of Therapy:  Heparin level 0.3-0.7 units/ml Monitor platelets by anticoagulation protocol: Yes   Plan:  Give 4000 units bolus x 1 Start heparin infusion at 1400 units/hr Check anti-Xa level in 6 hours and daily while on heparin Continue to monitor H&H and platelets  Gerre Pebbles Kira Hartl 02/26/2017,6:45 PM

## 2017-02-26 NOTE — ED Provider Notes (Signed)
[facility] Emergency Department Provider Note ____________________________________________   I have reviewed the triage vital signs and the triage nursing note.  HISTORY  Chief Complaint Shortness of Breath   Historian Patient  HPI Bradley Bridges is a 52 y.o. male History of hypertension, but is not on medication for that, and a brother with coronary disease, but no known history of coronary disease, presents with epigastric discomfort since Saturday. It's been fairly constant. No chest pain or trouble breathing. Some nausea and occasional sweats. No fevers. No coughing.  Pain is moderate. Nothing makes it worse or better. No exertional component.    No past medical history on file.  There are no active problems to display for this patient.   No past surgical history on file.  Prior to Admission medications   Not on File    Allergies  Allergen Reactions  . Penicillins Rash    No family history on file.  Social History Social History  Substance Use Topics  . Smoking status: Current Every Day Smoker  . Smokeless tobacco: Never Used  . Alcohol use No    Review of Systems  Constitutional: Negative for fever. Eyes: Negative for visual changes. ENT: Negative for sore throat. Cardiovascular: Negative for chest pain. Respiratory: Negative for shortness of breath. Gastrointestinal:  Epigastric pain as per history of present illness. Genitourinary: Negative for dysuria. Musculoskeletal: Negative for back pain. Skin: Negative for rash. Neurological: Negative for headache.  ____________________________________________   PHYSICAL EXAM:  VITAL SIGNS: ED Triage Vitals  Enc Vitals Group     BP 02/26/17 1716 (!) 164/101     Pulse Rate 02/26/17 1716 97     Resp 02/26/17 1716 16     Temp 02/26/17 1716 98.2 F (36.8 C)     Temp Source 02/26/17 1716 Oral     SpO2 02/26/17 1716 97 %     Weight 02/26/17 1718 238 lb (108 kg)     Height  02/26/17 1718  (1.854 m)     Head Circumference --      Peak Flow --      Pain Score 02/26/17 1718 5     Pain Loc --      Pain Edu? --      Excl. in GC? --      Constitutional: Alert and oriented. Well appearing and in no distress. HEENT   Head: Normocephalic and atraumatic.      Eyes: Conjunctivae are normal. Pupils equal and round.       Ears:         Nose: No congestion/rhinnorhea.   Mouth/Throat: Mucous membranes are moist.   Neck: No stridor. Cardiovascular/Chest: Normal rate, regular rhythm.  No murmurs, rubs, or gallops. Respiratory: Normal respiratory effort without tachypnea nor retractions. Breath sounds are clear and equal bilaterally. No wheezes/rales/rhonchi. Gastrointestinal: Soft. No distention, no guarding, no rebound.  mild epigastric discomfort.  Genitourinary/rectal:Deferred Musculoskeletal: Nontender with normal range of motion in all extremities. No joint effusions.  No lower extremity tenderness.  No edema. Neurologic:  Normal speech and language. No gross or focal neurologic deficits are appreciated. Skin:  Skin is warm, dry and intact. No rash noted. Psychiatric: Mood and affect are normal. Speech and behavior are normal. Patient exhibits appropriate insight and judgment.   ____________________________________________  LABS (pertinent positives/negatives) I, Governor Rooks, MD the attending physician have reviewed the labs noted below.  Labs Reviewed  BASIC METABOLIC PANEL - Abnormal; Notable for the following:  Result Value   Sodium 134 (*)    Chloride 95 (*)    Glucose, Bld 134 (*)    Creatinine, Ser 1.31 (*)    All other components within normal limits  CBC - Abnormal; Notable for the following:    WBC 17.1 (*)    All other components within normal limits  TROPONIN I - Abnormal; Notable for the following:    Troponin I 13.83 (*)    All other components within normal limits  URINE DRUG SCREEN, QUALITATIVE ([facility] ONLY)  HEPARIN  LEVEL (UNFRACTIONATED)  PROTIME-INR  HEPARIN LEVEL (UNFRACTIONATED)  APTT    ____________________________________________    EKG I, Governor Rooks, MD, the attending physician have personally viewed and interpreted all ECGs.   100 bpm. Normal sinus rhythm. Left axis deviation. Concerning ST segment morphology in the inferior leads without diagnostic STEMI criteria. T waves inverted and somewhat ST depression in V5 and V6.  Repeat EKG 93 bpm. Normal sinus rhythm with PACs. Left axis deviation. Similar morphology with convex ST segment inferiorly without meeting STEMI criteria. Persistent bilateral T-wave inversions with mild ST segment depressions. ____________________________________________  RADIOLOGY All Xrays were viewed by me.  Imaging interpreted by Radiologist, and I, Governor Rooks, MD the attending physician have reviewed the radiologist interpretation noted below.   Chest x-ray portable: Mild left basilar atelectasis. __________________________________________  PROCEDURES  Procedure(s) performed: None  Critical Care performed: None  ____________________________________________  No current facility-administered medications on file prior to encounter.    No current outpatient prescriptions on file prior to encounter.    ____________________________________________  ED COURSE / ASSESSMENT AND PLAN  Pertinent labs & imaging results that were available during my care of the patient were reviewed by me and considered in my medical decision making (see chart for details).    Patient presented due to to half days of epigastric discomfort without reported chest pain or trouble breathing, with an abnormal EKG, but not meeting STEMI criteria.  troponin came back elevated at 13.  patient's on aspirin and nitroglycerin and heparin.   After 2nd nitro cp down to 4/10, will give additional nitro and morphine and plan for nitropaste as well.  DIFFERENTIAL DIAGNOSIS:  Differential diagnosis includes, but is not limited to, ACS, aortic dissection, pulmonary embolism, cardiac tamponade, pneumothorax, pneumonia, pericarditis/myocarditis, GI-related causes including esophagitis/gastritis, and musculoskeletal chest wall pain.    CONSULTATIONS:  Dr. Elease Hashimoto, cardiolgist - continue medical management.  Dr. Nemiah Commander hospitalist for admission.  Patient / Family / Caregiver informed of clinical course, medical decision-making process, and agree with plan.   ___________________________________________   FINAL CLINICAL IMPRESSION(S) / ED DIAGNOSES   Final diagnoses:  Epigastric pain  NSTEMI (non-ST elevated myocardial infarction) [facility])              Note: This dictation was prepared with Dragon dictation. Any transcriptional errors that result from this process are unintentional    Governor Rooks, MD 02/26/17 1858

## 2017-02-26 NOTE — H&P (Signed)
[facility] - [place] at [facility]   PATIENT NAME: Bradley Bridges    MR#:  [id number]  DATE OF BIRTH:  1966-05-03  DATE OF ADMISSION:  02/26/2017  PRIMARY CARE PHYSICIAN: Patient, [name] Per   REQUESTING/REFERRING PHYSICIAN: Dr. Governor Rooks  CHIEF COMPLAINT:   Chief Complaint  Patient presents with  . Shortness of Breath    HISTORY OF PRESENT ILLNESS:  Bradley Bridges  is a 51 y.o. male with a known history of Hypertension not on any medications, gouty arthritis, ongoing smoking presents to hospital secondary to upper abdominal pain associated with diaphoresis for 3 days now. Patient denies any prior cardiac history. He was diagnosed with hypertension several years ago but has not been taking any medications for that. 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 Has strong family history of heart disease as his father passed away in his 51s secondary to a major heart condition. Patient states his symptoms started 2 days ago, spontaneously while he was at rest. He had intense upper abdominal pain without any radiation. Denies any radiation to the arms, has had radicular left arm symptoms for a long time now with some tingling of the left fingers. He rated his pain initially as 9/10 all day Saturday. He is taking Tums and Prilosec over the counter thinking that this was indigestion. His pain slowly improved all day Sunday and he was at a 3/10. But he was feeling more tired and more short of breath easily. This morning the pain came back with worsening intensity again and his sister made him come to the emergency room. His EKG was abnormal with ST changes noted in inferior leads and also lead V3, V4. His troponin is elevated at 13.  PAST MEDICAL HISTORY:   Past Medical History:  Diagnosis Date  . Gout   . Hypertension   . Tobacco abuse     PAST SURGICAL HISTORY:  No past surgical history on file.  SOCIAL HISTORY:    Social History  Substance Use Topics  . Smoking status: Current Every Day Smoker    Packs/day: 1.00  . Smokeless tobacco: Never Used  . Alcohol use Yes     Comment: wine over the weekend    FAMILY HISTORY:   Family History  Problem Relation Age of Onset  . Rheum arthritis Mother   . CAD Father        Died in his 34's  . Diabetes Sister     DRUG ALLERGIES:   Allergies  Allergen Reactions  . Penicillins Rash    Has patient had a PCN reaction causing immediate rash, facial/tongue/throat swelling, SOB or lightheadedness with hypotension: Yes Has patient had a PCN reaction causing severe rash involving mucus membranes or skin necrosis: No Has patient had a PCN reaction that required hospitalization: No Has patient had a PCN reaction occurring within the last 10 years: No If all of the above answers are "NO", then may proceed with Cephalosporin use.    REVIEW OF SYSTEMS:   Review of Systems  Constitutional: Positive for diaphoresis. Negative for chills, fever, malaise/fatigue and weight loss.  HENT: Negative for ear discharge, ear pain, hearing loss, nosebleeds and tinnitus.   Eyes: Negative for blurred vision, double vision and photophobia.  Respiratory: Negative for cough, hemoptysis, shortness of breath and wheezing.   Cardiovascular: Negative for chest pain, palpitations, orthopnea and leg swelling.  Gastrointestinal: Positive for abdominal pain. Negative for constipation,  diarrhea, heartburn, melena, nausea and vomiting.  Genitourinary: Negative for dysuria, frequency, hematuria and urgency.  Musculoskeletal: Positive for joint pain. Negative for back pain, myalgias and neck pain.  Skin: Negative for rash.  Neurological: Negative for dizziness, tremors, sensory change, speech change, focal weakness and headaches.  Endo/Heme/Allergies: Does not bruise/bleed easily.  Psychiatric/Behavioral: Negative for depression.    MEDICATIONS AT HOME:   Prior to Admission  medications   Not on File      VITAL SIGNS:  Blood pressure (!) 172/109, pulse 93, temperature 98.2 F (36.8 C), temperature source Oral, resp. rate 20, height  (1.854 m), weight 108 kg (238 lb), SpO2 96 %.  PHYSICAL EXAMINATION:   Physical Exam  GENERAL:  51 y.o.-year-old overweight patient sitting in the bed with no acute distress, but slightly dyspneic on exertion.  EYES: Pupils equal, round, reactive to light and accommodation. No scleral icterus. Extraocular muscles intact.  HEENT: Head atraumatic, normocephalic. Oropharynx and nasopharynx clear.  NECK:  Supple, no jugular venous distention. No thyroid enlargement, no tenderness.  LUNGS: Normal breath sounds bilaterally, no wheezing, rales,rhonchi or crepitation. No use of accessory muscles of respiration.  CARDIOVASCULAR: S1, S2 normal. No murmurs, rubs, or gallops.  ABDOMEN: Soft, nontender, nondistended. Bowel sounds present. No organomegaly or mass.  EXTREMITIES: No pedal edema, cyanosis, or clubbing.  NEUROLOGIC: Cranial nerves II through XII are intact. Muscle strength 5/5 in all extremities. Sensation intact. Gait not checked.  PSYCHIATRIC: The patient is alert and oriented x 3.  SKIN: No obvious rash, lesion, or ulcer.   LABORATORY PANEL:   CBC  Recent Labs Lab 02/26/17 1723  WBC 17.1*  HGB 14.4  HCT 42.5  PLT 383   ------------------------------------------------------------------------------------------------------------------  Chemistries   Recent Labs Lab 02/26/17 1723  NA 134*  K 5.0  CL 95*  CO2 27  GLUCOSE 134*  BUN 17  CREATININE 1.31*  CALCIUM 9.9   ------------------------------------------------------------------------------------------------------------------  Cardiac Enzymes  Recent Labs Lab 02/26/17 1723  TROPONINI 13.83*   ------------------------------------------------------------------------------------------------------------------  RADIOLOGY:  Dg Chest Port 1  View  Result Date: 02/26/2017 CLINICAL DATA:  Shortness of breath, chest pain, tachycardia EXAM: PORTABLE CHEST 1 VIEW COMPARISON:  Portable exam 1728 hours compared to 05/02/2014 FINDINGS: Normal heart size, mediastinal contours, and pulmonary vascularity. LEFT basilar atelectasis. Lungs otherwise clear. No pleural effusion or pneumothorax. Bones demineralized. IMPRESSION: Mild LEFT basilar atelectasis. Electronically Signed   By: Ulyses Southward M.D.   On: 02/26/2017 17:46    EKG:   Orders placed or performed during the hospital encounter of 02/26/17  . ED EKG within 10 minutes  . ED EKG within 10 minutes  . EKG 12-Lead  . EKG 12-Lead  . EKG 12-Lead  . EKG 12-Lead    IMPRESSION AND PLAN:   Bradley Bridges  is a 51 y.o. male with a known history of Hypertension not on any medications, gouty arthritis, ongoing smoking presents to hospital secondary to upper abdominal pain associated with diaphoresis for 3 days now.  #1 NSTEMI- EKG findings concerning for STEMI- subtle ST elevations noted -Discussed with cardiology-likely patient might have infarcted 2 days ago. -IV heparin, he does not seem to be in any distress at this time. Monitor on telemetry -Recycle troponins. Echocardiogram -Aspirin, metoprolol, statin and losartan started. IV heparin added -Cardiac catheterization in a.m. follow-up lipid panel -Continue Nitropaste, if chest pain worsens-change to nitroglycerin drip  #2 hypertension-known history, not taking medications. -Started on metoprolol, losartan. Also on nitroglycerin patch  #3 tobacco use disorder-counseled  against smoking, started on nicotine patch  #4 GERD-add Protonix  #5 leukocytosis-likely stress margination secondary to NSTEMI -Chest x-ray with no infection, urine analysis is pending. Monitor  #6 acute renal failure-gentle IV hydration and recheck renal function  #7 DVT prophylaxis-already on IV heparin    All the records are reviewed and case discussed with  ED provider. Management plans discussed with the patient, family and they are in agreement.  CODE STATUS: Full Code  TOTAL TIME TAKING CARE OF THIS PATIENT: 50 minutes.    Madailein Londo M.D on 02/26/2017 at 7:01 PM  Between 7am to 6pm - Pager - [phone]  After 6pm go to [email or web address] - password [email or web address]  [facility]  Office  [phone]  CC: Primary care physician; Patient, [name] Per

## 2017-02-27 ENCOUNTER — Inpatient Hospital Stay (HOSPITAL_COMMUNITY)
Admission: AD | Admit: 2017-02-27 | Discharge: 2017-03-02 | DRG: 280 | Disposition: A | Discharge status: Home / Self Care | Payer: Medicaid Other | Source: Other Acute Inpatient Hospital | Attending: Internal Medicine | Admitting: Internal Medicine

## 2017-02-27 ENCOUNTER — Encounter (HOSPITAL_COMMUNITY): Payer: Self-pay | Admitting: General Practice

## 2017-02-27 ENCOUNTER — Encounter: Payer: Self-pay | Admitting: Internal Medicine

## 2017-02-27 ENCOUNTER — Encounter
Admission: EM | Disposition: A | Discharge status: Other Acute Inpatient Hospital | Payer: Self-pay | Source: Home / Self Care | Attending: Internal Medicine

## 2017-02-27 DIAGNOSIS — Z720 Tobacco use: Secondary | ICD-10-CM

## 2017-02-27 DIAGNOSIS — I5021 Acute systolic (congestive) heart failure: Secondary | ICD-10-CM | POA: Diagnosis present

## 2017-02-27 DIAGNOSIS — I214 Non-ST elevation (NSTEMI) myocardial infarction: Principal | ICD-10-CM

## 2017-02-27 DIAGNOSIS — I110 Hypertensive heart disease with heart failure: Secondary | ICD-10-CM | POA: Diagnosis present

## 2017-02-27 DIAGNOSIS — I2510 Atherosclerotic heart disease of native coronary artery without angina pectoris: Secondary | ICD-10-CM | POA: Diagnosis present

## 2017-02-27 DIAGNOSIS — Z79899 Other long term (current) drug therapy: Secondary | ICD-10-CM | POA: Diagnosis not present

## 2017-02-27 DIAGNOSIS — R739 Hyperglycemia, unspecified: Secondary | ICD-10-CM | POA: Diagnosis present

## 2017-02-27 DIAGNOSIS — R1013 Epigastric pain: Secondary | ICD-10-CM | POA: Diagnosis not present

## 2017-02-27 DIAGNOSIS — N179 Acute kidney failure, unspecified: Secondary | ICD-10-CM | POA: Diagnosis present

## 2017-02-27 DIAGNOSIS — I213 ST elevation (STEMI) myocardial infarction of unspecified site: Secondary | ICD-10-CM

## 2017-02-27 DIAGNOSIS — F17210 Nicotine dependence, cigarettes, uncomplicated: Secondary | ICD-10-CM | POA: Diagnosis present

## 2017-02-27 DIAGNOSIS — Z6832 Body mass index (BMI) 32.0-32.9, adult: Secondary | ICD-10-CM | POA: Diagnosis not present

## 2017-02-27 DIAGNOSIS — E785 Hyperlipidemia, unspecified: Secondary | ICD-10-CM | POA: Diagnosis present

## 2017-02-27 DIAGNOSIS — Z880 Allergy status to penicillin: Secondary | ICD-10-CM

## 2017-02-27 DIAGNOSIS — F1010 Alcohol abuse, uncomplicated: Secondary | ICD-10-CM | POA: Diagnosis present

## 2017-02-27 DIAGNOSIS — I255 Ischemic cardiomyopathy: Secondary | ICD-10-CM | POA: Diagnosis present

## 2017-02-27 DIAGNOSIS — K219 Gastro-esophageal reflux disease without esophagitis: Secondary | ICD-10-CM | POA: Diagnosis present

## 2017-02-27 DIAGNOSIS — Z8249 Family history of ischemic heart disease and other diseases of the circulatory system: Secondary | ICD-10-CM

## 2017-02-27 DIAGNOSIS — I10 Essential (primary) hypertension: Secondary | ICD-10-CM

## 2017-02-27 DIAGNOSIS — I25110 Atherosclerotic heart disease of native coronary artery with unstable angina pectoris: Secondary | ICD-10-CM

## 2017-02-27 HISTORY — DX: Atherosclerotic heart disease of native coronary artery without angina pectoris: I25.10

## 2017-02-27 HISTORY — PX: LEFT HEART CATH AND CORONARY ANGIOGRAPHY: CATH118249

## 2017-02-27 HISTORY — DX: Non-ST elevation (NSTEMI) myocardial infarction: I21.4

## 2017-02-27 LAB — CBC
HCT: 37.1 % — ABNORMAL LOW (ref 40.0–52.0)
Hemoglobin: 12.8 g/dL — ABNORMAL LOW (ref 13.0–18.0)
MCH: 32 pg (ref 26.0–34.0)
MCHC: 34.5 g/dL (ref 32.0–36.0)
MCV: 92.8 fL (ref 80.0–100.0)
PLATELETS: 339 10*3/uL (ref 150–440)
RBC: 4 MIL/uL — ABNORMAL LOW (ref 4.40–5.90)
RDW: 12.9 % (ref 11.5–14.5)
WBC: 11.5 10*3/uL — AB (ref 3.8–10.6)

## 2017-02-27 LAB — TROPONIN I: Troponin I: 13.76 ng/mL (ref <0.03)

## 2017-02-27 LAB — HEPARIN LEVEL (UNFRACTIONATED): Heparin Unfractionated: 0.27 IU/mL — ABNORMAL LOW (ref 0.30–0.70)

## 2017-02-27 LAB — BASIC METABOLIC PANEL
Anion gap: 9 (ref 5–15)
BUN: 21 mg/dL — AB (ref 6–20)
CALCIUM: 8.9 mg/dL (ref 8.9–10.3)
CHLORIDE: 99 mmol/L — AB (ref 101–111)
CO2: 28 mmol/L (ref 22–32)
CREATININE: 1.56 mg/dL — AB (ref 0.61–1.24)
GFR calc Af Amer: 58 mL/min — ABNORMAL LOW (ref >60)
GFR calc non Af Amer: 50 mL/min — ABNORMAL LOW (ref >60)
Glucose, Bld: 116 mg/dL — ABNORMAL HIGH (ref 65–99)
Potassium: 4.6 mmol/L (ref 3.5–5.1)
SODIUM: 136 mmol/L (ref 135–145)

## 2017-02-27 LAB — HEMOGLOBIN A1C
HEMOGLOBIN A1C: 5.8 % — AB (ref 4.8–5.6)
Mean Plasma Glucose: 119.76 mg/dL

## 2017-02-27 SURGERY — LEFT HEART CATH AND CORONARY ANGIOGRAPHY
Anesthesia: Moderate Sedation

## 2017-02-27 MED ORDER — SODIUM CHLORIDE 0.9% FLUSH: 3.0000 mL | INTRAVENOUS | Status: DC | PRN

## 2017-02-27 MED ORDER — ATORVASTATIN CALCIUM 80 MG PO TABS: 80.0000 mg | ORAL_TABLET | Freq: Every day | ORAL | Status: DC

## 2017-02-27 MED ORDER — MIDAZOLAM HCL 2 MG/2ML IJ SOLN
INTRAMUSCULAR | Status: DC | PRN
  Administered 2017-02-27: 1 mg via INTRAVENOUS

## 2017-02-27 MED ORDER — ACETAMINOPHEN 325 MG PO TABS
650.0000 mg | ORAL_TABLET | ORAL | Status: DC | PRN
  Administered 2017-03-01 – 2017-03-02 (×3): 650 mg via ORAL
  Filled 2017-02-27 (×3): qty 2

## 2017-02-27 MED ORDER — HEPARIN (PORCINE) IN NACL 100-0.45 UNIT/ML-% IJ SOLN: 1600.0000 [IU]/h | INTRAMUSCULAR | Status: DC

## 2017-02-27 MED ORDER — ONDANSETRON HCL 4 MG/2ML IJ SOLN: 4.0000 mg | Freq: Four times a day (QID) | INTRAMUSCULAR | Status: DC | PRN

## 2017-02-27 MED ORDER — HEPARIN SODIUM (PORCINE) 1000 UNIT/ML IJ SOLN
INTRAMUSCULAR | Status: AC
Start: 2017-02-27 — End: ?
  Filled 2017-02-27: qty 1

## 2017-02-27 MED ORDER — VERAPAMIL HCL 2.5 MG/ML IV SOLN
INTRAVENOUS | Status: AC
Start: 2017-02-27 — End: ?
  Filled 2017-02-27: qty 2

## 2017-02-27 MED ORDER — NICOTINE 14 MG/24HR TD PT24
14.0000 mg | MEDICATED_PATCH | Freq: Every day | TRANSDERMAL | Status: DC
  Administered 2017-02-28 – 2017-03-02 (×3): 14 mg via TRANSDERMAL
  Filled 2017-02-27 (×4): qty 1

## 2017-02-27 MED ORDER — SODIUM CHLORIDE 0.9 % IV SOLN: INTRAVENOUS | Status: DC

## 2017-02-27 MED ORDER — SODIUM CHLORIDE 0.9 % WEIGHT BASED INFUSION: 3.0000 mL/kg/h | INTRAVENOUS | Status: DC

## 2017-02-27 MED ORDER — METOPROLOL TARTRATE 25 MG PO TABS
25.0000 mg | ORAL_TABLET | Freq: Two times a day (BID) | ORAL | Status: DC
  Administered 2017-02-27 – 2017-02-28 (×3): 25 mg via ORAL
  Filled 2017-02-27 (×4): qty 1

## 2017-02-27 MED ORDER — ASPIRIN EC 81 MG PO TBEC
81.0000 mg | DELAYED_RELEASE_TABLET | Freq: Every day | ORAL | Status: DC
  Administered 2017-02-28 – 2017-03-02 (×3): 81 mg via ORAL
  Filled 2017-02-27 (×3): qty 1

## 2017-02-27 MED ORDER — CARVEDILOL 3.125 MG PO TABS: 3.1250 mg | ORAL_TABLET | Freq: Two times a day (BID) | ORAL | Status: DC

## 2017-02-27 MED ORDER — SODIUM CHLORIDE 0.9 % IV SOLN: 250.0000 mL | INTRAVENOUS | Status: DC | PRN

## 2017-02-27 MED ORDER — HEPARIN BOLUS VIA INFUSION
1500.0000 [IU] | Freq: Once | INTRAVENOUS | Status: AC
  Administered 2017-02-27: 1500 [IU] via INTRAVENOUS
  Filled 2017-02-27: qty 1500

## 2017-02-27 MED ORDER — MIDAZOLAM HCL 2 MG/2ML IJ SOLN
INTRAMUSCULAR | Status: AC
  Filled 2017-02-27: qty 2

## 2017-02-27 MED ORDER — SODIUM CHLORIDE 0.9% FLUSH
3.0000 mL | Freq: Two times a day (BID) | INTRAVENOUS | Status: DC
  Administered 2017-02-27 – 2017-03-01 (×4): 3 mL via INTRAVENOUS

## 2017-02-27 MED ORDER — SODIUM CHLORIDE 0.9% FLUSH: 3.0000 mL | Freq: Two times a day (BID) | INTRAVENOUS | Status: DC

## 2017-02-27 MED ORDER — FUROSEMIDE 10 MG/ML IJ SOLN: 20.0000 mg | Freq: Every day | INTRAMUSCULAR | Status: DC

## 2017-02-27 MED ORDER — FENTANYL CITRATE (PF) 100 MCG/2ML IJ SOLN
INTRAMUSCULAR | Status: AC
  Filled 2017-02-27: qty 2

## 2017-02-27 MED ORDER — NITROGLYCERIN 2 % TD OINT
0.5000 [in_us] | TOPICAL_OINTMENT | Freq: Four times a day (QID) | TRANSDERMAL | Status: DC
  Administered 2017-02-27 – 2017-03-01 (×7): 0.5 [in_us] via TOPICAL
  Filled 2017-02-27: qty 30

## 2017-02-27 MED ORDER — NITROGLYCERIN 0.4 MG SL SUBL: 0.4000 mg | SUBLINGUAL_TABLET | SUBLINGUAL | Status: DC | PRN

## 2017-02-27 MED ORDER — IOPAMIDOL (ISOVUE-300) INJECTION 61%
INTRAVENOUS | Status: DC | PRN
  Administered 2017-02-27: 35 mL via INTRA_ARTERIAL

## 2017-02-27 MED ORDER — HEPARIN SODIUM (PORCINE) 1000 UNIT/ML IJ SOLN
INTRAMUSCULAR | Status: DC | PRN
  Administered 2017-02-27: 5000 [IU] via INTRAVENOUS

## 2017-02-27 MED ORDER — PANTOPRAZOLE SODIUM 40 MG PO TBEC
40.0000 mg | DELAYED_RELEASE_TABLET | Freq: Every day | ORAL | Status: DC
  Administered 2017-02-27 – 2017-03-01 (×3): 40 mg via ORAL
  Filled 2017-02-27 (×3): qty 1

## 2017-02-27 MED ORDER — FENTANYL CITRATE (PF) 100 MCG/2ML IJ SOLN
INTRAMUSCULAR | Status: DC | PRN
  Administered 2017-02-27: 50 ug via INTRAVENOUS
  Administered 2017-02-27: 25 ug via INTRAVENOUS

## 2017-02-27 MED ORDER — HEPARIN (PORCINE) IN NACL 2-0.9 UNIT/ML-% IJ SOLN
INTRAMUSCULAR | Status: AC
  Filled 2017-02-27: qty 500

## 2017-02-27 MED ORDER — HEPARIN (PORCINE) IN NACL 100-0.45 UNIT/ML-% IJ SOLN
1850.0000 [IU]/h | INTRAMUSCULAR | Status: DC
  Administered 2017-02-27: 1600 [IU]/h via INTRAVENOUS
  Administered 2017-02-28: 1850 [IU]/h via INTRAVENOUS
  Filled 2017-02-27 (×2): qty 250

## 2017-02-27 MED ORDER — ATORVASTATIN CALCIUM 80 MG PO TABS
80.0000 mg | ORAL_TABLET | Freq: Every day | ORAL | Status: DC
  Administered 2017-02-27 – 2017-03-01 (×3): 80 mg via ORAL
  Filled 2017-02-27 (×3): qty 1

## 2017-02-27 MED ORDER — SODIUM CHLORIDE 0.9 % WEIGHT BASED INFUSION
1.0000 mL/kg/h | INTRAVENOUS | Status: DC
Start: 2017-02-27 — End: 2017-02-27

## 2017-02-27 SURGICAL SUPPLY — 8 items
CATH INFINITI 5 FR JL3.5 (CATHETERS) ×3 IMPLANT
CATH INFINITI JR4 5F (CATHETERS) ×6 IMPLANT
DEVICE INFLAT 30 PLUS (MISCELLANEOUS) IMPLANT
DEVICE RAD TR BAND REGULAR (VASCULAR PRODUCTS) ×3 IMPLANT
GLIDESHEATH SLEND SS 6F .021 (SHEATH) ×3 IMPLANT
KIT MANI 3VAL PERCEP (MISCELLANEOUS) ×3 IMPLANT
PACK CARDIAC CATH (CUSTOM PROCEDURE TRAY) ×3 IMPLANT
WIRE ROSEN-J .035X260CM (WIRE) ×6 IMPLANT

## 2017-02-27 NOTE — Care Management (Signed)
Patient has not worked in over 15 years.  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 After she died, patient came down with gout so was unable to work. Has lifetime rights to the house and he rents out a room for income for 260 dollars and half the utilities.  He does not drive.  His sister "takes me places."  He does not have pcp and no insurance.  Provided patient with application for [facility].  Instructed that if he would complete- CM will fax to the clinics.  Patient does not seem very motivated to participate. He denies issues with reading. Admitted with nstemi. Cardiology involved and planning for cardiac cath

## 2017-02-27 NOTE — Progress Notes (Signed)
[facility] here to pick up patient to transport to [facility].

## 2017-02-27 NOTE — Interval H&P Note (Signed)
History and Physical Interval Note:  02/27/2017 11:55 AM  Bradley Bridges  has presented today for cardiac catheterization, with the diagnosis of NSTEMI. The various methods of treatment have been discussed with the patient and family. After consideration of risks, benefits and other options for treatment, the patient has consented to  Procedure(s): LEFT HEART CATH AND CORONARY ANGIOGRAPHY (N/A) as a surgical intervention .  The patient's history has been reviewed, patient examined, no change in status, stable for surgery.  I have reviewed the patient's chart and labs.  Questions were answered to the patient's satisfaction.    Cath Lab Visit (complete for each Cath Lab visit)  Clinical Evaluation Leading to the Procedure:   ACS: Yes.    Non-ACS: N/A  Tritia Endo

## 2017-02-27 NOTE — Progress Notes (Signed)
ANTICOAGULATION CONSULT NOTE  Pharmacy Consult for heparin gtt Indication: chest pain/ACS  Allergies  Allergen Reactions  . Penicillins Rash    Has patient had a PCN reaction causing immediate rash, facial/tongue/throat swelling, SOB or lightheadedness with hypotension: Yes Has patient had a PCN reaction causing severe rash involving mucus membranes or skin necrosis: No Has patient had a PCN reaction that required hospitalization: No Has patient had a PCN reaction occurring within the last 10 years: No If all of the above answers are "NO", then may proceed with Cephalosporin use.    Patient Measurements: Height:  (185.4 cm) Weight: 235 lb 9.6 oz (106.9 kg) IBW/kg (Calculated) : 79.9 Heparin Dosing Weight: 102 kg  Vital Signs: Temp: 99.3 F (37.4 C) (10/09 0734) Temp Source: Oral (10/09 0734) BP: 128/82 (10/09 0734) Pulse Rate: 76 (10/09 0734)  Labs:  Recent Labs  02/26/17 1723 02/26/17 1900 02/26/17 2320 02/27/17 0524  HGB 14.4  --   --  12.8*  HCT 42.5  --   --  37.1*  PLT 383  --   --  339  APTT  --  33  --   --   LABPROT  --  13.9  --   --   INR  --  1.08  --   --   HEPARINUNFRC <0.10*  --   --   --   CREATININE 1.31*  --   --  1.56*  TROPONINI 13.83*  --  16.08* 13.76*    Estimated Creatinine Clearance: 72.7 mL/min (A) (by C-G formula based on SCr of 1.56 mg/dL (H)).   Assessment: 51 year old male, pharmacy consulted for ACS/STEMI per protocol heparin gtt.   Goal of Therapy:  Heparin level 0.3-0.7 units/ml Monitor platelets by anticoagulation protocol: Yes   Plan:  Give 4000 units bolus x 1 Start heparin infusion at 1400 units/hr Check anti-Xa level in 6 hours and daily while on heparin Continue to monitor H&H and platelets   10/9 AM - no heparin level overnight. Will order STAT heparin level.  Crist Fat L 02/27/2017,8:14 AM

## 2017-02-27 NOTE — H&P (View-Only) (Signed)
Cardiology Consult    Patient ID: Bradley Bridges MRN: [id number], DOB/AGE: 1965/10/25   Admit date: 02/26/2017 Date of Consult: 02/27/2017  Primary Physician: Patient, [name] Per Primary Cardiologist: new - C. End, MD  Requesting Provider: S. Sudini, MD  Patient Profile    Bradley Bridges is a 51 y.o. male with a history of HTN, GERD, tob abuse, ETOH abuse, obesity, and FH of premature CAD, who is being seen today for the evaluation of NSTEMI at the request of Dr. Elpidio Anis.  Past Medical History   Past Medical History:  Diagnosis Date  . GERD (gastroesophageal reflux disease)    a. x 20 yrs  . Gout   . Hypertension    a. x 20 yrs - no meds since 2017.  . Obesity   . Tobacco abuse     History reviewed. No pertinent surgical history.   Allergies  Allergies  Allergen Reactions  . Penicillins Rash    Has patient had a PCN reaction causing immediate rash, facial/tongue/throat swelling, SOB or lightheadedness with hypotension: Yes Has patient had a PCN reaction causing severe rash involving mucus membranes or skin necrosis: No Has patient had a PCN reaction that required hospitalization: No Has patient had a PCN reaction occurring within the last 10 years: No If all of the above answers are "NO", then may proceed with Cephalosporin use.    History of Present Illness    51 y/o ? with a h/o HTN, GERD, tob abuse, ETOH abuse, obesity, and FH of premature CAD.  He has no personal prior cardiac history.  He lives locally with a roommate and does not routinely exercise.  He was in his usoh until Saturday 10/6, when he began to experience retrosternal chest discomfort w/o associated Ss.  He thought that it was indigestion and so he took prilosec and tums.  Symptoms were mild but persistent throughout the day and night on Saturday but worsened on Sunday.  He continued to think that he was having indigestion.  On 10/7, he developed dyspnea with ongoing c/p and radiation to his left  arm.  For that reason, he presented to the ED on the afternoon of 10/7.  ECG notable for somewhat diffuse ST/T changes.  Initial trop 13.83.  He was admitted and placed on asa, heparin,  blocker, and statin.  Trop peaked @ 16 and is now trending back down.  He cont to report 1/10 chest discomfort.  Of note, creat 1.31 on admission, 1.56 this AM after receiving losartan 50 mg last night.  Inpatient Medications    . aspirin  81 mg Oral Daily  . atorvastatin  40 mg Oral q1800  . metoprolol tartrate  25 mg Oral BID  . nicotine  21 mg Transdermal Daily  . nitroGLYCERIN  1 inch Topical Q6H  . ondansetron (ZOFRAN) IV  4 mg Intravenous Once  . pantoprazole  40 mg Oral Daily    Family History    Family History  Problem Relation Age of Onset  . Rheum arthritis Mother   . CAD Father        First MI in his 2's.  Died in his 65's  . Other Father        liver failure  . Diabetes Sister   . CAD Brother        s/p stenting    Social History    Social History   Social History  . Marital status: Single    Spouse name: N/A  .  Number of children: N/A  . Years of education: N/A   Occupational History  . Not on file.   Social History Main Topics  . Smoking status: Current Every Day Smoker    Packs/day: 1.00    Years: 30.00  . Smokeless tobacco: Never Used  . Alcohol use Yes     Comment: 1/5th of Vodka every weekend  . 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 Sexual activity: Not on file   Other Topics Concern  . Not on file   Social History Narrative   Independent at baseline.  [place]  Does not routinely exercise.     Review of Systems    General:  No chills, fever, night sweats or weight changes.  Cardiovascular:  +++ chest pain, +++ dyspnea on exertion, no edema, orthopnea, palpitations, paroxysmal nocturnal dyspnea. Dermatological: No rash, lesions/masses Respiratory: No cough, +++  dyspnea Urologic: No hematuria, dysuria Abdominal:   +++ chronic GERD.  No nausea, vomiting, diarrhea, bright red blood per rectum, melena, or hematemesis Neurologic:  No visual changes, wkns, changes in mental status. All other systems reviewed and are otherwise negative except as noted above.  Physical Exam    Blood pressure 128/82, pulse 76, temperature 99.3 F (37.4 C), temperature source Oral, resp. rate 18, height  (1.854 m), weight 235 lb 9.6 oz (106.9 kg), SpO2 94 %.  General: Pleasant, NAD Psych: Normal affect. Neuro: Alert and oriented X 3. Moves all extremities spontaneously. HEENT: Normal  Neck: Supple without bruits or JVD. Lungs:  Resp regular and unlabored, CTA. Heart: RRR no s3, s4, or murmurs. Abdomen: Soft, non-tender, non-distended, BS + x 4.  Extremities: No clubbing, cyanosis or edema. DP/PT/Radials 2+ and equal bilaterally.  Labs     Recent Labs  02/26/17 1723 02/26/17 2320 02/27/17 0524  TROPONINI 13.83* 16.08* 13.76*   Lab Results  Component Value Date   WBC 11.5 (H) 02/27/2017   HGB 12.8 (L) 02/27/2017   HCT 37.1 (L) 02/27/2017   MCV 92.8 02/27/2017   PLT 339 02/27/2017    Recent Labs Lab 02/27/17 0524  NA 136  K 4.6  CL 99*  CO2 28  BUN 21*  CREATININE 1.56*  CALCIUM 8.9  GLUCOSE 116*   Lab Results  Component Value Date   CHOL 196 02/26/2017   HDL 44 02/26/2017   LDLCALC 108 (H) 02/26/2017   TRIG 218 (H) 02/26/2017     Radiology Studies    Dg Chest Port 1 View  Result Date: 02/26/2017 CLINICAL DATA:  Shortness of breath, chest pain, tachycardia EXAM: PORTABLE CHEST 1 VIEW COMPARISON:  Portable exam 1728 hours compared to 05/02/2014 FINDINGS: Normal heart size, mediastinal contours, and pulmonary vascularity. LEFT basilar atelectasis. Lungs otherwise clear. No pleural effusion or pneumothorax. Bones demineralized. IMPRESSION: Mild LEFT basilar atelectasis. Electronically Signed   By: Ulyses Southward M.D.   On: 02/26/2017 17:46     ECG & Cardiac Imaging    RSR, 93, PACs, LAD, LAE, inf, antlat ST dep, TWI.  Assessment & Plan    1.  NSTEMI:  Pt w/o prior h/o CAD but with FH of premature CAD, untreated HTN, obesity, and tobacco abuse.  He developed retrosternal chest pain beginning on 10/6, which persisted over the weekend and became associated with dyspnea on 10/7, prompting him to present to the ED.  There, ECG abnl with ST/T changes and trop  of 13.  C/p slightly improved with IV heparin, nitrate, asa,  blocker.  C/p 1/10 this AM.  Plan on diagnostic cath this AM.  The patient understands that risks include but are not limited to stroke (1 in 1000), death (1 in 1000), kidney failure [usually temporary] (1 in 500), bleeding (1 in 200), allergic reaction [possibly serious] (1 in 200), and agrees to proceed.  Creat up slightly from admission  received losartan 50 last night.  Losartan now on hold.  Will increase IVF this AM and plan on diagnostic cath later this AM.  Eventual cardiac rehab.  2.  Essential HTN:  Stable on  blocker.  3.  HL:  LDL 108.  High potency statin started.  4.  Tob Abuse:  1 ppd x 30+ yrs.  Cessation advised.  Currently on nicotine patch.  5.  ETOH abuse:  1/5th of Vodka on weekends.  Cessation advised.    6.  Morbid Obesity:  Cardiac rehab.  7.  Hyperglycemia:  Gluc 116 this AM (heparin in D5W).  F/u A1c.  8.  AKI:  Creat up this AM.  Increasing IVF.  Limit contrast exposure.  Avoid nephrotoxic agents.  Signed, Nicolasa Ducking, NP 02/27/2017, 8:24 AM  For questions or updates, please contact   Please consult [email or web address] for contact info under Cardiology/STEMI.

## 2017-02-27 NOTE — Progress Notes (Signed)
ANTICOAGULATION CONSULT NOTE  Pharmacy Consult for heparin gtt Indication: chest pain/ACS  Allergies  Allergen Reactions  . Penicillins Rash    Has patient had a PCN reaction causing immediate rash, facial/tongue/throat swelling, SOB or lightheadedness with hypotension: Yes Has patient had a PCN reaction causing severe rash involving mucus membranes or skin necrosis: No Has patient had a PCN reaction that required hospitalization: No Has patient had a PCN reaction occurring within the last 10 years: No If all of the above answers are "NO", then may proceed with Cephalosporin use.    Patient Measurements: TBW 107 kg Heparin Dosing Weight: 102 kg  Assessment: 51 year old male, pharmacy consulted for ACS/STEMI per protocol heparin gtt. Heparin level earlier today was slightly low at 1,400 units/hr. Held for cath and now to restart at 1800 tonight.  Goal of Therapy:  Heparin level 0.3-0.7 units/ml Monitor platelets by anticoagulation protocol: Yes   Plan:  Restart heparin gtt at 1,600 units/hr at 1800 Monitor daily heparin level, CBC, s/s of bleed  Enzo Bi, PharmD, [facility] Clinical Pharmacist Pager [phone] 02/27/2017 3:38 PM

## 2017-02-27 NOTE — Consult Note (Signed)
 Cardiology Consult    Patient ID: Bradley Bridges MRN: [id number], DOB/AGE: 11/17/1965   Admit date: 02/26/2017 Date of Consult: 02/27/2017  Primary Physician: Patient, [name] Per Primary Cardiologist: new - C. End, MD  Requesting Provider: S. Sudini, MD  Patient Profile    Bradley Bridges is a 51 y.o. male with a history of HTN, GERD, tob abuse, ETOH abuse, obesity, and FH of premature CAD, who is being seen today for the evaluation of NSTEMI at the request of Dr. Sudini.  Past Medical History   Past Medical History:  Diagnosis Date  . GERD (gastroesophageal reflux disease)    a. x 20 yrs  . Gout   . Hypertension    a. x 20 yrs - no meds since 2017.  . Obesity   . Tobacco abuse     History reviewed. No pertinent surgical history.   Allergies  Allergies  Allergen Reactions  . Penicillins Rash    Has patient had a PCN reaction causing immediate rash, facial/tongue/throat swelling, SOB or lightheadedness with hypotension: Yes Has patient had a PCN reaction causing severe rash involving mucus membranes or skin necrosis: No Has patient had a PCN reaction that required hospitalization: No Has patient had a PCN reaction occurring within the last 10 years: No If all of the above answers are "NO", then may proceed with Cephalosporin use.    History of Present Illness    51 y/o ? with a h/o HTN, GERD, tob abuse, ETOH abuse, obesity, and FH of premature CAD.  He has no personal prior cardiac history.  He lives locally with a roommate and does not routinely exercise.  He was in his usoh until Saturday 10/6, when he began to experience retrosternal chest discomfort w/o associated Ss.  He thought that it was indigestion and so he took prilosec and tums.  Symptoms were mild but persistent throughout the day and night on Saturday but worsened on Sunday.  He continued to think that he was having indigestion.  On 10/7, he developed dyspnea with ongoing c/p and radiation to his left  arm.  For that reason, he presented to the ED on the afternoon of 10/7.  ECG notable for somewhat diffuse ST/T changes.  Initial trop 13.83.  He was admitted and placed on asa, heparin,  blocker, and statin.  Trop peaked @ 16 and is now trending back down.  He cont to report 1/10 chest discomfort.  Of note, creat 1.31 on admission, 1.56 this AM after receiving losartan 50 mg last night.  Inpatient Medications    . aspirin  81 mg Oral Daily  . atorvastatin  40 mg Oral q1800  . metoprolol tartrate  25 mg Oral BID  . nicotine  21 mg Transdermal Daily  . nitroGLYCERIN  1 inch Topical Q6H  . ondansetron (ZOFRAN) IV  4 mg Intravenous Once  . pantoprazole  40 mg Oral Daily    Family History    Family History  Problem Relation Age of Onset  . Rheum arthritis Mother   . CAD Father        First MI in his 40's.  Died in his 50's  . Other Father        liver failure  . Diabetes Sister   . CAD Brother        s/p stenting    Social History    Social History   Social History  . Marital status: Single    Spouse name: N/A  .   Number of children: N/A  . Years of education: N/A   Occupational History  . Not on file.   Social History Main Topics  . Smoking status: Current Every Day Smoker    Packs/day: 1.00    Years: 30.00  . Smokeless tobacco: Never Used  . Alcohol use Yes     Comment: 1/5th of Vodka every weekend  . 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 Sexual activity: Not on file   Other Topics Concern  . Not on file   Social History Narrative   Independent at baseline.  [place]  Does not routinely exercise.     Review of Systems    General:  No chills, fever, night sweats or weight changes.  Cardiovascular:  +++ chest pain, +++ dyspnea on exertion, no edema, orthopnea, palpitations, paroxysmal nocturnal dyspnea. Dermatological: No rash, lesions/masses Respiratory: No cough, +++  dyspnea Urologic: No hematuria, dysuria Abdominal:   +++ chronic GERD.  No nausea, vomiting, diarrhea, bright red blood per rectum, melena, or hematemesis Neurologic:  No visual changes, wkns, changes in mental status. All other systems reviewed and are otherwise negative except as noted above.  Physical Exam    Blood pressure 128/82, pulse 76, temperature 99.3 F (37.4 C), temperature source Oral, resp. rate 18, height 6' 1" (1.854 m), weight 235 lb 9.6 oz (106.9 kg), SpO2 94 %.  General: Pleasant, NAD Psych: Normal affect. Neuro: Alert and oriented X 3. Moves all extremities spontaneously. HEENT: Normal  Neck: Supple without bruits or JVD. Lungs:  Resp regular and unlabored, CTA. Heart: RRR no s3, s4, or murmurs. Abdomen: Soft, non-tender, non-distended, BS + x 4.  Extremities: No clubbing, cyanosis or edema. DP/PT/Radials 2+ and equal bilaterally.  Labs     Recent Labs  02/26/17 1723 02/26/17 2320 02/27/17 0524  TROPONINI 13.83* 16.08* 13.76*   Lab Results  Component Value Date   WBC 11.5 (H) 02/27/2017   HGB 12.8 (L) 02/27/2017   HCT 37.1 (L) 02/27/2017   MCV 92.8 02/27/2017   PLT 339 02/27/2017    Recent Labs Lab 02/27/17 0524  NA 136  K 4.6  CL 99*  CO2 28  BUN 21*  CREATININE 1.56*  CALCIUM 8.9  GLUCOSE 116*   Lab Results  Component Value Date   CHOL 196 02/26/2017   HDL 44 02/26/2017   LDLCALC 108 (H) 02/26/2017   TRIG 218 (H) 02/26/2017     Radiology Studies    Dg Chest Port 1 View  Result Date: 02/26/2017 CLINICAL DATA:  Shortness of breath, chest pain, tachycardia EXAM: PORTABLE CHEST 1 VIEW COMPARISON:  Portable exam 1728 hours compared to 05/02/2014 FINDINGS: Normal heart size, mediastinal contours, and pulmonary vascularity. LEFT basilar atelectasis. Lungs otherwise clear. No pleural effusion or pneumothorax. Bones demineralized. IMPRESSION: Mild LEFT basilar atelectasis. Electronically Signed   By: Mark  Boles M.D.   On: 02/26/2017 17:46     ECG & Cardiac Imaging    RSR, 93, PACs, LAD, LAE, inf, antlat ST dep, TWI.  Assessment & Plan    1.  NSTEMI:  Pt w/o prior h/o CAD but with FH of premature CAD, untreated HTN, obesity, and tobacco abuse.  He developed retrosternal chest pain beginning on 10/6, which persisted over the weekend and became associated with dyspnea on 10/7, prompting him to present to the ED.  There, ECG abnl with ST/T changes and trop   of 13.  C/p slightly improved with IV heparin, nitrate, asa,  blocker.  C/p 1/10 this AM.  Plan on diagnostic cath this AM.  The patient understands that risks include but are not limited to stroke (1 in 1000), death (1 in 1000), kidney failure [usually temporary] (1 in 500), bleeding (1 in 200), allergic reaction [possibly serious] (1 in 200), and agrees to proceed.  Creat up slightly from admission  received losartan 50 last night.  Losartan now on hold.  Will increase IVF this AM and plan on diagnostic cath later this AM.  Eventual cardiac rehab.  2.  Essential HTN:  Stable on  blocker.  3.  HL:  LDL 108.  High potency statin started.  4.  Tob Abuse:  1 ppd x 30+ yrs.  Cessation advised.  Currently on nicotine patch.  5.  ETOH abuse:  1/5th of Vodka on weekends.  Cessation advised.    6.  Morbid Obesity:  Cardiac rehab.  7.  Hyperglycemia:  Gluc 116 this AM (heparin in D5W).  F/u A1c.  8.  AKI:  Creat up this AM.  Increasing IVF.  Limit contrast exposure.  Avoid nephrotoxic agents.  Signed, Solimar Maiden, NP 02/27/2017, 8:24 AM  For questions or updates, please contact   Please consult [email or web address] for contact info under Cardiology/STEMI.   

## 2017-02-27 NOTE — Progress Notes (Signed)
ANTICOAGULATION CONSULT NOTE  Pharmacy Consult for heparin gtt Indication: chest pain/ACS  Allergies  Allergen Reactions  . Penicillins Rash    Has patient had a PCN reaction causing immediate rash, facial/tongue/throat swelling, SOB or lightheadedness with hypotension: Yes Has patient had a PCN reaction causing severe rash involving mucus membranes or skin necrosis: No Has patient had a PCN reaction that required hospitalization: No Has patient had a PCN reaction occurring within the last 10 years: No If all of the above answers are "NO", then may proceed with Cephalosporin use.    Patient Measurements: Height:  (185.4 cm) Weight: 235 lb 9.6 oz (106.9 kg) IBW/kg (Calculated) : 79.9 Heparin Dosing Weight: 102 kg  Vital Signs: Temp: 98.3 F (36.8 C) (10/09 1101) Temp Source: Oral (10/09 1101) BP: 126/75 (10/09 1300) Pulse Rate: 78 (10/09 1300)  Labs:  Recent Labs  02/26/17 1723 02/26/17 1900 02/26/17 2320 02/27/17 0524 02/27/17 0759  HGB 14.4  --   --  12.8*  --   HCT 42.5  --   --  37.1*  --   PLT 383  --   --  339  --   APTT  --  33  --   --   --   LABPROT  --  13.9  --   --   --   INR  --  1.08  --   --   --   HEPARINUNFRC <0.10*  --   --   --  0.27*  CREATININE 1.31*  --   --  1.56*  --   TROPONINI 13.83*  --  16.08* 13.76*  --     Estimated Creatinine Clearance: 72.7 mL/min (A) (by C-G formula based on SCr of 1.56 mg/dL (H)).   Assessment: 51 year old male, pharmacy consulted for ACS/STEMI per protocol heparin gtt.   Goal of Therapy:  Heparin level 0.3-0.7 units/ml Monitor platelets by anticoagulation protocol: Yes   Plan:  Give 4000 units bolus x 1 Start heparin infusion at 1400 units/hr Check anti-Xa level in 6 hours and daily while on heparin Continue to monitor H&H and platelets   10/9 AM - HL at 0800 = 0.27, RN confirms heparin drip running at 14 ml/hr, no line issues, no s/sx of bleeding noted per RN. Will order heparin bolus 1500 units  IV x1 then increase drip to 1600 units/hr (=16 ml/hr). Heparin level 6h after rate change. CBC in AM.  10/9 - Heparin stopped for cath. New consult to restart heparin 4 h post sheath removal. Called Specials who stated sheath was removed 1240. Will restart heparin drip at most recent previous rate at 1645. Heparin level 6h after restart, CBC in AM.   Crist Fat L 02/27/2017,1:21 PM

## 2017-02-27 NOTE — Progress Notes (Signed)
[facility] [place] at [facility]   PATIENT NAME: Bradley Bridges    MR#:  [id number]  DATE OF BIRTH:  [birth year]  SUBJECTIVE:  CHIEF COMPLAINT:   Chief Complaint  Patient presents with  . Shortness of Breath   NO CP today. Some SOB  REVIEW OF SYSTEMS:    Review of Systems  Constitutional: Positive for malaise/fatigue. Negative for chills and fever.  HENT: Negative for sore throat.   Eyes: Negative for blurred vision, double vision and pain.  Respiratory: Positive for shortness of breath. Negative for cough, hemoptysis and wheezing.   Cardiovascular: Positive for chest pain. Negative for palpitations, orthopnea and leg swelling.  Gastrointestinal: Negative for abdominal pain, constipation, diarrhea, heartburn, nausea and vomiting.  Genitourinary: Negative for dysuria and hematuria.  Musculoskeletal: Negative for back pain and joint pain.  Skin: Negative for rash.  Neurological: Positive for weakness. Negative for sensory change, speech change, focal weakness and headaches.  Endo/Heme/Allergies: Does not bruise/bleed easily.  Psychiatric/Behavioral: Negative for depression. The patient is not nervous/anxious.     DRUG ALLERGIES:   Allergies  Allergen Reactions  . Penicillins Rash    Has patient had a PCN reaction causing immediate rash, facial/tongue/throat swelling, SOB or lightheadedness with hypotension: Yes Has patient had a PCN reaction causing severe rash involving mucus membranes or skin necrosis: No Has patient had a PCN reaction that required hospitalization: No Has patient had a PCN reaction occurring within the last 10 years: No If all of the above answers are "NO", then may proceed with Cephalosporin use.    VITALS:  Blood pressure 128/82, pulse 76, temperature 99.3 F (37.4 C), temperature source Oral, resp. rate 18, height  (1.854 m), weight 106.9 kg (235 lb 9.6 oz), SpO2 94 %.  PHYSICAL EXAMINATION:   Physical Exam  GENERAL:  51  y.o.-year-old patient lying in the bed with no acute distress.  EYES: Pupils equal, round, reactive to light and accommodation. No scleral icterus. Extraocular muscles intact.  HEENT: Head atraumatic, normocephalic. Oropharynx and nasopharynx clear.  NECK:  Supple, no jugular venous distention. No thyroid enlargement, no tenderness.  LUNGS: Normal breath sounds bilaterally, no wheezing, rales, rhonchi. No use of accessory muscles of respiration.  CARDIOVASCULAR: S1, S2 normal. No murmurs, rubs, or gallops.  ABDOMEN: Soft, nontender, nondistended. Bowel sounds present. No organomegaly or mass.  EXTREMITIES: No cyanosis, clubbing or edema b/l.    NEUROLOGIC: Cranial nerves II through XII are intact. No focal Motor or sensory deficits b/l.   PSYCHIATRIC: The patient is alert and oriented x 3.  SKIN: No obvious rash, lesion, or ulcer.   LABORATORY PANEL:   CBC  Recent Labs Lab 02/27/17 0524  WBC 11.5*  HGB 12.8*  HCT 37.1*  PLT 339   ------------------------------------------------------------------------------------------------------------------ Chemistries   Recent Labs Lab 02/27/17 0524  NA 136  K 4.6  CL 99*  CO2 28  GLUCOSE 116*  BUN 21*  CREATININE 1.56*  CALCIUM 8.9   ------------------------------------------------------------------------------------------------------------------  Cardiac Enzymes  Recent Labs Lab 02/27/17 0524  TROPONINI 13.76*   ------------------------------------------------------------------------------------------------------------------  RADIOLOGY:  Dg Chest Port 1 View  Result Date: 02/26/2017 CLINICAL DATA:  Shortness of breath, chest pain, tachycardia EXAM: PORTABLE CHEST 1 VIEW COMPARISON:  Portable exam 1728 hours compared to 05/02/2014 FINDINGS: Normal heart size, mediastinal contours, and pulmonary vascularity. LEFT basilar atelectasis. Lungs otherwise clear. No pleural effusion or pneumothorax. Bones demineralized. IMPRESSION:  Mild LEFT basilar atelectasis. Electronically Signed   By: Angelyn Punt.D.  On: 02/26/2017 17:46     ASSESSMENT AND PLAN:   Bradley Bridges  is a 51 y.o. male with a known history of Hypertension not on any medications, gouty arthritis, ongoing smoking presents to hospital secondary to upper abdominal pain associated with diaphoresis for 3 days now.  # NSTEMI -Discussed with cardiology -Telemetry  Echocardiogram - pending -Aspirin, metoprolol, statin . - IV heparin. Losartan stopped due to AKI and Contrast with Catheterization -Cardiac catheterization today  # hypertension- known history, not taking medications. -Started on metoprolol.  # tobacco use disorder- Counseled to quit smoking > spent  # GERD-add Protonix  # leukocytosis-likely stress margination secondary to NSTEMI -IMproving  # AKI ON IVF Repeat labs in AM Increase IVF  # DVT prophylaxis-already on IV heparin  All the records are reviewed and case discussed with Care Management/Social Worker Management plans discussed with the patient, family and they are in agreement.  CODE STATUS: FULL CODE  DVT Prophylaxis: SCDs  TOTAL TIME TAKING CARE OF THIS PATIENT: 35 minutes.   POSSIBLE D/C IN 1-2 DAYS, DEPENDING ON CLINICAL CONDITION.  Milagros Loll R M.D on 02/27/2017 at 9:00 AM  Between 7am to 6pm - Pager - [phone]  After 6pm go to [email or web address] - password [email or web address]  [facility]  Office  [phone]  CC: Primary care physician; Patient, [name] Per  Note: This dictation was prepared with Dragon dictation along with smaller phrase technology. Any transcriptional errors that result from this process are unintentional.

## 2017-02-27 NOTE — Plan of Care (Signed)
Problem: Pain Managment: Goal: General experience of comfort will improve Outcome: Progressing Pt admitted from ED with chest/abdominal pain. troponins found to be positive, on heparin gtt. Treated abdominal pain once with GI cocktail which gave relief.  Cardiology to see today to assess for a cardiac cath.

## 2017-02-27 NOTE — H&P (Signed)
Cardiology Consult    Patient ID: Bradley Bridges MRN: [id number], DOB/AGE: 50-Jul-1967   Admit date: 02/26/2017 Date of Consult: 02/27/2017  Primary Physician: Patient, [name] Per Primary Cardiologist: new - C. End, MD  Requesting Provider: S. Sudini, MD  Patient Profile    Bradley Bridges is a 51 y.o. male with a history of HTN, GERD, tob abuse, ETOH abuse, obesity, and FH of premature CAD, who is being seen today for the evaluation of NSTEMI at the request of Dr. Elpidio Anis.  Past Medical History       Past Medical History:  Diagnosis Date  . GERD (gastroesophageal reflux disease)    a. x 20 yrs  . Gout   . Hypertension    a. x 20 yrs - no meds since 2017.  . Obesity   . Tobacco abuse     History reviewed. No pertinent surgical history.   Allergies       Allergies  Allergen Reactions  . Penicillins Rash    Has patient had a PCN reaction causing immediate rash, facial/tongue/throat swelling, SOB or lightheadedness with hypotension: Yes Has patient had a PCN reaction causing severe rash involving mucus membranes or skin necrosis: No Has patient had a PCN reaction that required hospitalization: No Has patient had a PCN reaction occurring within the last 10 years: No If all of the above answers are "NO", then may proceed with Cephalosporin use.    History of Present Illness    51 y/o ? with a h/o HTN, GERD, tob abuse, ETOH abuse, obesity, and FH of premature CAD.  He has no personal prior cardiac history.  He lives locally with a roommate and does not routinely exercise.  He was in his usoh until Saturday 10/6, when he began to experience retrosternal chest discomfort w/o associated Ss.  He thought that it was indigestion and so he took prilosec and tums.  Symptoms were mild but persistent throughout the day and night on Saturday but worsened on Sunday.  He continued to think that he was having indigestion.  On 10/7, he developed dyspnea with ongoing  c/p and radiation to his left arm.  For that reason, he presented to the ED on the afternoon of 10/7.  ECG notable for somewhat diffuse ST/T changes.  Initial trop 13.83.  He was admitted and placed on asa, heparin, ? blocker, and statin.  Trop peaked @ 16 and is now trending back down.  He cont to report 1/10 chest discomfort.  Of note, creat 1.31 on admission, 1.56 this AM after receiving losartan 50 mg last night.  Inpatient Medications    . aspirin  81 mg Oral Daily  . atorvastatin  40 mg Oral q1800  . metoprolol tartrate  25 mg Oral BID  . nicotine  21 mg Transdermal Daily  . nitroGLYCERIN  1 inch Topical Q6H  . ondansetron (ZOFRAN) IV  4 mg Intravenous Once  . pantoprazole  40 mg Oral Daily    Family History         Family History  Problem Relation Age of Onset  . Rheum arthritis Mother   . CAD Father        First MI in his 21's.  Died in his 72's  . Other Father        liver failure  . Diabetes Sister   . CAD Brother        s/p stenting    Social History    Social History  Social History  . Marital status: Single    Spouse name: N/A  . Number of children: N/A  . Years of education: N/A      Occupational History  . Not on file.         Social History Main Topics  . Smoking status: Current Every Day Smoker    Packs/day: 1.00    Years: 30.00  . Smokeless tobacco: Never Used  . Alcohol use Yes     Comment: 1/5th of Vodka every weekend  . 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 Sexual activity: Not on file       Other Topics Concern  . Not on file      Social History Narrative   Independent at baseline.  [place]  Does not routinely exercise.     Review of Systems    General:  No chills, fever, night sweats or weight changes.  Cardiovascular:  +++ chest pain, +++ dyspnea on exertion, no edema, orthopnea, palpitations, paroxysmal  nocturnal dyspnea. Dermatological: No rash, lesions/masses Respiratory: No cough, +++ dyspnea Urologic: No hematuria, dysuria Abdominal:   +++ chronic GERD.  No nausea, vomiting, diarrhea, bright red blood per rectum, melena, or hematemesis Neurologic:  No visual changes, wkns, changes in mental status. All other systems reviewed and are otherwise negative except as noted above.  Physical Exam    Blood pressure 128/82, pulse 76, temperature 99.3 F (37.4 C), temperature source Oral, resp. rate 18, height  (1.854 m), weight 235 lb 9.6 oz (106.9 kg), SpO2 94 %.  General: Pleasant, NAD Psych: Normal affect. Neuro: Alert and oriented X 3. Moves all extremities spontaneously. HEENT: Normal  Neck: Supple without bruits or JVD. Lungs:  Resp regular and unlabored, CTA. Heart: RRR no s3, s4, or murmurs. Abdomen: Soft, non-tender, non-distended, BS + x 4.  Extremities: No clubbing, cyanosis or edema. DP/PT/Radials 2+ and equal bilaterally.  Labs     Recent Labs (last 2 labs)    Recent Labs  02/26/17 1723 02/26/17 2320 02/27/17 0524  TROPONINI 13.83* 16.08* 13.76*     Recent Labs       Lab Results  Component Value Date   WBC 11.5 (H) 02/27/2017   HGB 12.8 (L) 02/27/2017   HCT 37.1 (L) 02/27/2017   MCV 92.8 02/27/2017   PLT 339 02/27/2017      Recent Labs Lab 02/27/17 0524  NA 136  K 4.6  CL 99*  CO2 28  BUN 21*  CREATININE 1.56*  CALCIUM 8.9  GLUCOSE 116*   Recent Labs       Lab Results  Component Value Date   CHOL 196 02/26/2017   HDL 44 02/26/2017   LDLCALC 108 (H) 02/26/2017   TRIG 218 (H) 02/26/2017       Radiology Studies     Imaging Results  Dg Chest Port 1 View  Result Date: 02/26/2017 CLINICAL DATA:  Shortness of breath, chest pain, tachycardia EXAM: PORTABLE CHEST 1 VIEW COMPARISON:  Portable exam 1728 hours compared to 05/02/2014 FINDINGS: Normal heart size, mediastinal contours, and pulmonary vascularity. LEFT basilar  atelectasis. Lungs otherwise clear. No pleural effusion or pneumothorax. Bones demineralized. IMPRESSION: Mild LEFT basilar atelectasis. Electronically Signed   By: Ulyses Southward M.D.   On: 02/26/2017 17:46     ECG & Cardiac Imaging    RSR, 93, PACs, LAD, LAE, inf, antlat ST dep,  TWI.  Assessment & Plan    1.  NSTEMI:  Pt w/o prior h/o CAD but with FH of premature CAD, untreated HTN, obesity, and tobacco abuse.  He developed retrosternal chest pain beginning on 10/6, which persisted over the weekend and became associated with dyspnea on 10/7, prompting him to present to the ED.  There, ECG abnl with ST/T changes and trop of 13.  C/p slightly improved with IV heparin, nitrate, asa, ? blocker.  C/p 1/10 this AM.  Cath this afternoon revealed multivessel dzs.  Tx to [facility] for CT surgical eval.  2.  Essential HTN:  Stable on ? blocker.  3.  HL:  LDL 108.  High potency statin started.  4.  Tob Abuse:  1 ppd x 30+ yrs.  Cessation advised.  Currently on nicotine patch.  5.  ETOH abuse:  1/5th of Vodka on weekends.  Cessation advised.    6.  Morbid Obesity:  Cardiac rehab.  7.  Hyperglycemia:  Gluc 116 this AM (heparin in D5W).  F/u A1c.  8.  AKI:  Creat up this AM.  Increasing IVF.  Limit contrast exposure.  Avoid nephrotoxic agents.  Signed, Nicolasa Ducking, NP 02/27/2017, 8:24 AM

## 2017-02-27 NOTE — Progress Notes (Signed)
ANTICOAGULATION CONSULT NOTE  Pharmacy Consult for heparin gtt Indication: chest pain/ACS  Allergies  Allergen Reactions  . Penicillins Rash    Has patient had a PCN reaction causing immediate rash, facial/tongue/throat swelling, SOB or lightheadedness with hypotension: Yes Has patient had a PCN reaction causing severe rash involving mucus membranes or skin necrosis: No Has patient had a PCN reaction that required hospitalization: No Has patient had a PCN reaction occurring within the last 10 years: No If all of the above answers are "NO", then may proceed with Cephalosporin use.    Patient Measurements: Height:  (185.4 cm) Weight: 235 lb 9.6 oz (106.9 kg) IBW/kg (Calculated) : 79.9 Heparin Dosing Weight: 102 kg  Vital Signs: Temp: 99.3 F (37.4 C) (10/09 0734) Temp Source: Oral (10/09 0734) BP: 128/82 (10/09 0734) Pulse Rate: 76 (10/09 0734)  Labs:  Recent Labs  02/26/17 1723 02/26/17 1900 02/26/17 2320 02/27/17 0524 02/27/17 0759  HGB 14.4  --   --  12.8*  --   HCT 42.5  --   --  37.1*  --   PLT 383  --   --  339  --   APTT  --  33  --   --   --   LABPROT  --  13.9  --   --   --   INR  --  1.08  --   --   --   HEPARINUNFRC <0.10*  --   --   --  0.27*  CREATININE 1.31*  --   --  1.56*  --   TROPONINI 13.83*  --  16.08* 13.76*  --     Estimated Creatinine Clearance: 72.7 mL/min (A) (by C-G formula based on SCr of 1.56 mg/dL (H)).   Assessment: 51 year old male, pharmacy consulted for ACS/STEMI per protocol heparin gtt.   Goal of Therapy:  Heparin level 0.3-0.7 units/ml Monitor platelets by anticoagulation protocol: Yes   Plan:  Give 4000 units bolus x 1 Start heparin infusion at 1400 units/hr Check anti-Xa level in 6 hours and daily while on heparin Continue to monitor H&H and platelets   10/9 AM - HL at 0800 = 0.27, RN confirms heparin drip running at 14 ml/hr, no line issues, no s/sx of bleeding noted per RN. Will order heparin bolus 1500 units  IV x1 then increase drip to 1600 units/hr (=16 ml/hr). Heparin level 6h after rate change. CBC in AM.  Crist Fat L 02/27/2017,8:53 AM

## 2017-02-27 NOTE — Consult Note (Signed)
[address]       [place] [address]             [phone]      Cardiothoracic Surgery Consultation  Reason for Consult: Severe multi-vessel coronary artery disease s/p NSTEMI Referring Physician: Dr. Harrell Gave End  Bradley Bridges is an 51 y.o. male.  HPI:   The patient is a 51 year old gentleman with a history of ongoing heavy smoking, hypertension, gout, and a family history of premature coronary artery disease who reports development of pressure across the chest and upper abdomen associated with shortness of breath last Saturday. He thought it was indigestion since he has had similar discomfort before but not associated with shortness of breath. He took antacids but the symptoms persisted all day Saturday and the evening and worsened on Sunday. He developed some radiation to left arm and presented to [facility] ER on 10/7. ECG showed diffuse ST/T changes with an initial troponin of 13.83 that peaked at 16. His chest pain resolved since admission. He underwent cath today showing severe diffuse 3-vessel CAD with the culprit appearing to be a completely occluded proximal RCA. Elevated LVEDP of 25 mm Hg. LV gram not done and echo not done yet. He was transferred to [facility] for surgical evaluation.  Past Medical History:  Diagnosis Date  . Chest pain 02/2017  . Coronary artery disease   . GERD (gastroesophageal reflux disease)    a. x 20 yrs  . Gout   . Hypertension    a. x 20 yrs - no meds since 2017.  . NSTEMI (non-ST elevated myocardial infarction) ([place])   . Obesity   . Tobacco abuse     Past Surgical History:  Procedure Laterality Date  . LEFT HEART CATH AND CORONARY ANGIOGRAPHY N/A 02/27/2017   Procedure: LEFT HEART CATH AND CORONARY ANGIOGRAPHY;  Surgeon: Nelva Bush, MD;  Location: [facility];  Service: Cardiovascular;  Laterality: N/A;    Family History  Problem Relation Age of Onset  . Rheum arthritis Mother   . CAD Father    First MI in his 12's.  Died in his 24's  . Other Father        liver failure  . Diabetes Sister   . CAD Brother        s/p stenting    Social History:  reports that he has been smoking.  He has a 30.00 pack-year smoking history. He has never used smokeless tobacco. He reports that he drinks alcohol. He reports that he does not use drugs.  Allergies:  Allergies  Allergen Reactions  . Penicillins Rash    Has patient had a PCN reaction causing immediate rash, facial/tongue/throat swelling, SOB or lightheadedness with hypotension: Yes Has patient had a PCN reaction causing severe rash involving mucus membranes or skin necrosis: No Has patient had a PCN reaction that required hospitalization: No Has patient had a PCN reaction occurring within the last 10 years: No If all of the above answers are "NO", then may proceed with Cephalosporin use.    Medications:  I have reviewed the patient's current medications. Prior to Admission:  Prescriptions Prior to Admission  Medication Sig Dispense Refill Last Dose  . acetaminophen (TYLENOL) 500 MG tablet Take 1,000 mg by mouth every 4 (four) hours as needed.   02/26/2017 at Unknown time  . Omeprazole Magnesium (PRILOSEC OTC PO) Take 1 capsule by mouth daily.   02/26/2017 at Unknown time  Scheduled: . [START ON 02/28/2017] aspirin EC  81 mg Oral Daily  . atorvastatin  80 mg Oral q1800  . metoprolol tartrate  25 mg Oral BID  . nicotine  14 mg Transdermal Daily  . nitroGLYCERIN  0.5 inch Topical Q6H  . pantoprazole  40 mg Oral Daily  . sodium chloride flush  3 mL Intravenous Q12H   Continuous: . sodium chloride    . heparin 1,600 Units/hr (02/27/17 1759)   MEQ:ASTMHD chloride, acetaminophen, nitroGLYCERIN, ondansetron (ZOFRAN) IV, sodium chloride flush Anti-infectives    None      Results for orders placed or performed during the hospital encounter of 02/26/17 (from the past 48 hour(s))  Basic metabolic panel     Status: Abnormal    Collection Time: 02/26/17  5:23 PM  Result Value Ref Range   Sodium 134 (L) 135 - 145 mmol/L   Potassium 5.0 3.5 - 5.1 mmol/L   Chloride 95 (L) 101 - 111 mmol/L   CO2 27 22 - 32 mmol/L   Glucose, Bld 134 (H) 65 - 99 mg/dL   BUN 17 6 - 20 mg/dL   Creatinine, Ser 1.31 (H) 0.61 - 1.24 mg/dL   Calcium 9.9 8.9 - 10.3 mg/dL   GFR calc non Af Amer >60 >60 mL/min   GFR calc Af Amer >60 >60 mL/min    Comment: (NOTE) The eGFR has been calculated using the CKD EPI equation. This calculation has not been validated in all clinical situations. eGFR's persistently <60 mL/min signify possible Chronic Kidney Disease.    Anion gap 12 5 - 15  CBC     Status: Abnormal   Collection Time: 02/26/17  5:23 PM  Result Value Ref Range   WBC 17.1 (H) 3.8 - 10.6 K/uL   RBC 4.63 4.40 - 5.90 MIL/uL   Hemoglobin 14.4 13.0 - 18.0 g/dL   HCT 42.5 40.0 - 52.0 %   MCV 91.7 80.0 - 100.0 fL   MCH 31.1 26.0 - 34.0 pg   MCHC 33.9 32.0 - 36.0 g/dL   RDW 13.0 11.5 - 14.5 %   Platelets 383 150 - 440 K/uL  Troponin I     Status: Abnormal   Collection Time: 02/26/17  5:23 PM  Result Value Ref Range   Troponin I 13.83 (HH) <0.03 ng/mL    Comment: CRITICAL RESULT CALLED TO, READ BACK BY AND VERIFIED WITH AMBER JONES ON 02/26/17 AT 1813 [place]   Heparin level     Status: Abnormal   Collection Time: 02/26/17  5:23 PM  Result Value Ref Range   Heparin Unfractionated <0.10 (L) 0.30 - 0.70 IU/mL    Comment:        IF HEPARIN RESULTS ARE BELOW EXPECTED VALUES, AND PATIENT DOSAGE HAS BEEN CONFIRMED, SUGGEST FOLLOW UP TESTING OF ANTITHROMBIN III LEVELS.   Protime-INR     Status: None   Collection Time: 02/26/17  7:00 PM  Result Value Ref Range   Prothrombin Time 13.9 11.4 - 15.2 seconds   INR 1.08   APTT     Status: None   Collection Time: 02/26/17  7:00 PM  Result Value Ref Range   aPTT 33 24 - 36 seconds  Urine Drug Screen, Qualitative     Status: Abnormal   Collection Time: 02/26/17  9:41 PM  Result Value Ref  Range   Tricyclic, Ur Screen NONE DETECTED NONE DETECTED   Amphetamines, Ur Screen NONE DETECTED NONE DETECTED   MDMA (Ecstasy)Ur Screen NONE DETECTED NONE DETECTED  Cocaine Metabolite,Ur [place] NONE DETECTED NONE DETECTED   Opiate, Ur Screen POSITIVE (A) NONE DETECTED   Phencyclidine (PCP) Ur S NONE DETECTED NONE DETECTED   Cannabinoid 50 Ng, Ur [place] NONE DETECTED NONE DETECTED   Barbiturates, Ur Screen NONE DETECTED NONE DETECTED   Benzodiazepine, Ur Scrn NONE DETECTED NONE DETECTED   Methadone Scn, Ur NONE DETECTED NONE DETECTED    Comment: (NOTE) 660  Tricyclics, urine               Cutoff 1000 ng/mL 200  Amphetamines, urine             Cutoff 1000 ng/mL 300  MDMA (Ecstasy), urine           Cutoff 500 ng/mL 400  Cocaine Metabolite, urine       Cutoff 300 ng/mL 500  Opiate, urine                   Cutoff 300 ng/mL 600  Phencyclidine (PCP), urine      Cutoff 25 ng/mL 700  Cannabinoid, urine              Cutoff 50 ng/mL 800  Barbiturates, urine             Cutoff 200 ng/mL 900  Benzodiazepine, urine           Cutoff 200 ng/mL 1000 Methadone, urine                Cutoff 300 ng/mL 1100 1200 The urine drug screen provides only a preliminary, unconfirmed 1300 analytical test result and should not be used for non-medical 1400 purposes. Clinical consideration and professional judgment should 1500 be applied to any positive drug screen result due to possible 1600 interfering substances. A more specific alternate chemical method 1700 must be used in order to obtain a confirmed analytical result.  1800 Gas chromato graphy / mass spectrometry (GC/MS) is the preferred 1900 confirmatory method.   Urinalysis, Complete w Microscopic     Status: Abnormal   Collection Time: 02/26/17  9:41 PM  Result Value Ref Range   Color, Urine YELLOW (A) YELLOW   APPearance CLEAR (A) CLEAR   Specific Gravity, Urine 1.023 1.005 - 1.030   pH 5.0 5.0 - 8.0   Glucose, UA NEGATIVE NEGATIVE mg/dL   Hgb urine  dipstick NEGATIVE NEGATIVE   Bilirubin Urine NEGATIVE NEGATIVE   Ketones, ur NEGATIVE NEGATIVE mg/dL   Protein, ur 100 (A) NEGATIVE mg/dL   Nitrite NEGATIVE NEGATIVE   Leukocytes, UA NEGATIVE NEGATIVE   RBC / HPF 0-5 0 - 5 RBC/hpf   WBC, UA 0-5 0 - 5 WBC/hpf   Bacteria, UA NONE SEEN NONE SEEN   Squamous Epithelial / LPF NONE SEEN NONE SEEN   Mucus PRESENT   Lipid panel     Status: Abnormal   Collection Time: 02/26/17 11:20 PM  Result Value Ref Range   Cholesterol 196 0 - 200 mg/dL   Triglycerides 218 (H) <150 mg/dL   HDL 44 >40 mg/dL   Total CHOL/HDL Ratio 4.5 RATIO   VLDL 44 (H) 0 - 40 mg/dL   LDL Cholesterol 108 (H) 0 - 99 mg/dL    Comment:        Total Cholesterol/HDL:CHD Risk Coronary Heart Disease Risk Table                     Men   Women  1/2 Average Risk   3.4   3.3  Average  Risk       5.0   4.4  2 X Average Risk   9.6   7.1  3 X Average Risk  23.4   11.0        Use the calculated Patient Ratio above and the CHD Risk Table to determine the patient's CHD Risk.        ATP III CLASSIFICATION (LDL):  <100     mg/dL   Optimal  100-129  mg/dL   Near or Above                    Optimal  130-159  mg/dL   Borderline  160-189  mg/dL   High  >190     mg/dL   Very High   Troponin I     Status: Abnormal   Collection Time: 02/26/17 11:20 PM  Result Value Ref Range   Troponin I 16.08 (HH) <0.03 ng/mL    Comment: CRITICAL VALUE NOTED. VALUE IS CONSISTENT WITH PREVIOUSLY REPORTED/CALLED VALUE BY CAF   Hemoglobin A1c     Status: Abnormal   Collection Time: 02/26/17 11:20 PM  Result Value Ref Range   Hgb A1c MFr Bld 5.8 (H) 4.8 - 5.6 %    Comment: (NOTE) Pre diabetes:          5.7%-6.4% Diabetes:              >6.4% Glycemic control for   <7.0% adults with diabetes    Mean Plasma Glucose 119.76 mg/dL    Comment: Performed at [facility] [address] [place], [place] [address]  Troponin I     Status: Abnormal   Collection Time: 02/27/17  5:24 AM  Result  Value Ref Range   Troponin I 13.76 (HH) <0.03 ng/mL    Comment: CRITICAL VALUE NOTED. VALUE IS CONSISTENT WITH PREVIOUSLY REPORTED/CALLED VALUE/HKP  Basic metabolic panel     Status: Abnormal   Collection Time: 02/27/17  5:24 AM  Result Value Ref Range   Sodium 136 135 - 145 mmol/L   Potassium 4.6 3.5 - 5.1 mmol/L   Chloride 99 (L) 101 - 111 mmol/L   CO2 28 22 - 32 mmol/L   Glucose, Bld 116 (H) 65 - 99 mg/dL   BUN 21 (H) 6 - 20 mg/dL   Creatinine, Ser 1.56 (H) 0.61 - 1.24 mg/dL   Calcium 8.9 8.9 - 10.3 mg/dL   GFR calc non Af Amer 50 (L) >60 mL/min   GFR calc Af Amer 58 (L) >60 mL/min    Comment: (NOTE) The eGFR has been calculated using the CKD EPI equation. This calculation has not been validated in all clinical situations. eGFR's persistently <60 mL/min signify possible Chronic Kidney Disease.    Anion gap 9 5 - 15  CBC     Status: Abnormal   Collection Time: 02/27/17  5:24 AM  Result Value Ref Range   WBC 11.5 (H) 3.8 - 10.6 K/uL   RBC 4.00 (L) 4.40 - 5.90 MIL/uL   Hemoglobin 12.8 (L) 13.0 - 18.0 g/dL   HCT 37.1 (L) 40.0 - 52.0 %   MCV 92.8 80.0 - 100.0 fL   MCH 32.0 26.0 - 34.0 pg   MCHC 34.5 32.0 - 36.0 g/dL   RDW 12.9 11.5 - 14.5 %   Platelets 339 150 - 440 K/uL  Heparin level (unfractionated)     Status: Abnormal   Collection Time: 02/27/17  7:59 AM  Result Value Ref Range  Heparin Unfractionated 0.27 (L) 0.30 - 0.70 IU/mL    Comment:        IF HEPARIN RESULTS ARE BELOW EXPECTED VALUES, AND PATIENT DOSAGE HAS BEEN CONFIRMED, SUGGEST FOLLOW UP TESTING OF ANTITHROMBIN III LEVELS.     Dg Chest Port 1 View  Result Date: 02/26/2017 CLINICAL DATA:  Shortness of breath, chest pain, tachycardia EXAM: PORTABLE CHEST 1 VIEW COMPARISON:  Portable exam 1728 hours compared to 05/02/2014 FINDINGS: Normal heart size, mediastinal contours, and pulmonary vascularity. LEFT basilar atelectasis. Lungs otherwise clear. No pleural effusion or pneumothorax. Bones demineralized.  IMPRESSION: Mild LEFT basilar atelectasis. Electronically Signed   By: Lavonia Dana M.D.   On: 02/26/2017 17:46    Review of Systems  Constitutional: Positive for diaphoresis and malaise/fatigue. Negative for chills and fever.  HENT: Negative.   Eyes: Negative.   Respiratory: Positive for shortness of breath.   Cardiovascular: Positive for chest pain and orthopnea. Negative for palpitations, leg swelling and PND.  Gastrointestinal: Positive for heartburn. Negative for abdominal pain, nausea and vomiting.  Genitourinary: Negative.   Musculoskeletal: Positive for joint pain.  Skin: Negative.   Neurological: Negative.   Endo/Heme/Allergies: Negative.   Psychiatric/Behavioral: Positive for substance abuse.       Heavy ETOH   Blood pressure 118/85, pulse 73, temperature 98.5 F (36.9 C), temperature source Oral, resp. rate 20, height 6' 1" (1.854 m), weight 110.2 kg (242 lb 14.4 oz), SpO2 97 %. Physical Exam  Constitutional: He appears well-developed and well-nourished. No distress.  HENT:  Head: Normocephalic and atraumatic.  Mouth/Throat: Oropharynx is clear and moist.  Eyes: Pupils are equal, round, and reactive to light. EOM are normal.  Neck: Normal range of motion. Neck supple. No thyromegaly present.  Cardiovascular: Normal rate, regular rhythm and normal heart sounds.   No murmur heard. Respiratory: Effort normal and breath sounds normal. No respiratory distress.  GI: Soft. Bowel sounds are normal. He exhibits no distension. There is no tenderness.  Musculoskeletal: He exhibits no edema.  Gouty changes to joints all over.  Lymphadenopathy:    He has no cervical adenopathy.  Skin: Skin is warm and dry.  Psychiatric: He has a normal mood and affect.   Physicians   Panel Physicians Referring Physician Case Authorizing Physician  End, Harrell Gave, MD (Primary)  Rogelia Mire, NP  Procedures   LEFT HEART CATH AND CORONARY ANGIOGRAPHY  Conclusion    Conclusions: 1. Severe 3-vessel coronary artery disease, including sequential proximal and mid LAD lesions of 70-80% as well as diffuse 70-90% distal LAD disease, 80% ramus stenosis, 70% mid LCx disease, and occluded proximal RCA with faint collateral filling of the distal branches. 2. Moderately elevated left ventricular filling pressure (LVEDP 25 mmHg).  Recommendations: 1. Transfer to [facility] for surgical evaluation, though his surgical targets are suboptimal. I suspect the proximal RCA lesion was his culprit. However, given that his symptoms began >3 days ago, he is currently chest pain free, and his troponin has peaked, there is no indication for urgent PCI at this time. 2. Aggressive secondary prevention. 3. Gentle diuresis. 4. Obtain transthoracic echocardiogram.  Nelva Bush, MD [facility] Pager: [phone]   Indications   Non-ST elevation (NSTEMI) myocardial infarction ([place]) [I21.4 (ICD-10-CM)]  Procedural Details/Technique   Technical Details Indication: 51 y.o. year-old hypertension, GERD, tobacco abuse, alcohol abuse, obesity, and family history of premature CAD, admitted with at least 3 days of chest pain and shortness of breath, found to have an elevated troponin consistent with NSTEMI.  GFR: 50 ml/min  Procedure: The risks, benefits, complications, treatment options, and expected outcomes were discussed with the patient. The patient and/or family concurred with the proposed plan, giving informed consent. The patient was brought to the cath lab after IV hydration was begun and oral premedication was given. The patient was further sedated with Versed and Fentanyl. The right wrist was assessed with a modified Allens test which was normal. The right wrist was prepped and draped in a sterile fashion. 1% lidocaine was used for local anesthesia. Using the modified Seldinger access technique, a 34F slender Glidesheath was placed in the right radial artery. 3 mg  Verapamil was given through the sheath. Heparin 5,000 units were administered.  Selective coronary angiography was performed using 38F JL3.5 and JR4 catheters to engage the left and right coronary arteries, respectively. Left heart catheterization was performed using a 38F JR4 catheter. Left ventriculogram was not performed.  At the end of the procedure, the radial artery sheath was removed and a TR band applied to achieve patent hemostasis. There were no immediate complications. The patient was taken to the recovery area in stable condition.  Contrast used: 30 mL Isovue Fluoroscopy time: 4.2 min Radiation dose: 1,193 mGy   Estimated blood loss <50 mL.  During this procedure the patient was administered the following to achieve and maintain moderate conscious sedation: Versed 1 mg, Fentanyl 75 mcg, while the patient's heart rate, blood pressure, and oxygen saturation were continuously monitored. The period of conscious sedation was 35 minutes, of which I was present face-to-face 100% of this time.    Complications   Complications documented before study signed (02/27/2017 1:31 PM EDT)    No complications were associated with this study.  Documented by Nelva Bush, MD - 02/27/2017 1:22 PM EDT    Coronary Findings   Dominance: Right  Left Main  Vessel is large.  LM lesion, 25% stenosed.  Left Anterior Descending  Vessel is large.  Ost LAD lesion, 25% stenosed.  Prox LAD lesion, 70% stenosed.  Mid LAD lesion, 80% stenosed.  Dist LAD-1 lesion, 70% stenosed.  Dist LAD-2 lesion, 90% stenosed.  First Diagonal Branch  Vessel is small in size. There is moderate disease in the vessel.  Second Diagonal Branch  Vessel is small in size. There is moderate disease in the vessel.  Ramus Intermedius  Vessel is moderate in size.  Ramus lesion, 80% stenosed.  Left Circumflex  Vessel is moderate in size.  Mid Cx lesion, 70% stenosed.  First Obtuse Marginal Branch  Vessel is small in  size.  Second Obtuse Marginal Branch  Vessel is moderate in size. There is moderate disease in the vessel.  Lateral Second Obtuse Marginal Branch  There is moderate disease in the vessel.  Right Coronary Artery  Prox RCA lesion, 100% stenosed.  Right Posterior Descending Artery  RPDA filled by collaterals from 2nd Sept.  Left Heart   Left Ventricle LV end diastolic pressure is moderately elevated. LVEDP 25 mmHg.    Aortic Valve There is no aortic valve stenosis.    Coronary Diagrams   Diagnostic Diagram       Implants     No implant documentation for this case.  MERGE Images   Show images for CARDIAC CATHETERIZATION   Link to Procedure Log   Procedure Log    Hemo Data   AO Systolic Cath Pressure AO Diastolic Cath Pressure AO Mean Cath Pressure LV Systolic Cath Pressure LV End Diastolic  916 62 mmHg 75 mmHg -- --  -- -- --  110 mmHg 23 mmHg  -- -- -- 112 mmHg 24 mmHg  -- -- -- 114 mmHg 22 mmHg  114 79 mmHg 94 mmHg -- --  104 69 mmHg 83 mmHg      Assessment/Plan:  This 51 year old gentleman with multiple cardiac risk factors including heavy smoking, FH of premature CAD, HTN has severe, diffuse three vessel coronary artery disease presenting with a NSTEMI or late presenting STEMI due to occlusion of the proximal RCA. I have personally reviewed his cath films and I don't think he has suitable target vessels for CABG. The LAD is diffusely diseased out to the apex. The marginal branches are small and diffusely diseased and I don't think they are graftable. Their size is half or less that of the diagnostic catheter where we can access them. The distal RCA is barely visible and only over a short segment. He has not had an echo but I suspect that his EF is low. I think the best option for him is going to be medical therapy and risk factor reduction including smoking cessation. I discussed this with him and answered his questions. He seems to understand.   I spent 40 minutes  performing this consultation and > 50% of this time was spent face to face counseling and coordinating the care of this patient's severe multi-vessel coronary artery disease.  Gaye Pollack 02/27/2017, 6:53 PM

## 2017-02-27 NOTE — Discharge Summary (Signed)
[employer] - [place] at [facility]   PATIENT NAME: Bradley Bridges    MR#:  [id number]  DATE OF BIRTH:  11-02-1965  DATE OF ADMISSION:  02/26/2017 ADMITTING PHYSICIAN: Enid Baas, MD  DATE OF DISCHARGE: No discharge date for patient encounter.  PRIMARY CARE PHYSICIAN: Patient, [name] Per   ADMISSION DIAGNOSIS:  Epigastric pain [R10.13] SOB (shortness of breath) [R06.02] NSTEMI (non-ST elevated myocardial infarction) (HCC) [I21.4]  DISCHARGE DIAGNOSIS:  Active Problems:   NSTEMI (non-ST elevated myocardial infarction) (HCC)   SECONDARY DIAGNOSIS:   Past Medical History:  Diagnosis Date  . GERD (gastroesophageal reflux disease)    a. x 20 yrs  . Gout   . Hypertension    a. x 20 yrs - no meds since 2017.  . Obesity   . Tobacco abuse      ADMITTING HISTORY  See hnp  HOSPITAL COURSE:   Mr Melvyn Neth was admitted to the Telemetry unit for non-st elevation mi. Started on Heparin drip, aspirin, Statin, metoprolol. With significant elevation of troponin Cardiology [facility] and patient had cardiac catheterization. He is being transferred to [facility] in [place] for cardiothoracic evaluation you do three vessel disease on cardiac catheterization  CONSULTS OBTAINED:  Treatment Team:  Yvonne Kendall, MD  DRUG ALLERGIES:   Allergies  Allergen Reactions  . Penicillins Rash    Has patient had a PCN reaction causing immediate rash, facial/tongue/throat swelling, SOB or lightheadedness with hypotension: Yes Has patient had a PCN reaction causing severe rash involving mucus membranes or skin necrosis: No Has patient had a PCN reaction that required hospitalization: No Has patient had a PCN reaction occurring within the last 10 years: No If all of the above answers are "NO", then may proceed with Cephalosporin use.    DISCHARGE MEDICATIONS:   Current Discharge Medication List    CONTINUE these medications which have NOT CHANGED   Details   acetaminophen (TYLENOL) 500 MG tablet Take 1,000 mg by mouth every 4 (four) hours as needed.    Omeprazole Magnesium (PRILOSEC OTC PO) Take 1 capsule by mouth daily.        Today   VITAL SIGNS:  Blood pressure 116/76, pulse 73, temperature 98.3 F (36.8 C), temperature source Oral, resp. rate (!) 24, height  (1.854 m), weight 106.9 kg (235 lb 9.6 oz), SpO2 94 %.  I/O:   Intake/Output Summary (Last 24 hours) at 02/27/17 1357 Last data filed at 02/27/17 0825  Gross per 24 hour  Intake              580 ml  Output              200 ml  Net              380 ml    PHYSICAL EXAMINATION:  Physical Exam  GENERAL:  51 y.o.-year-old patient lying in the bed with no acute distress.  LUNGS: Normal breath sounds bilaterally, no wheezing, rales,rhonchi or crepitation. No use of accessory muscles of respiration.  CARDIOVASCULAR: S1, S2 normal. No murmurs, rubs, or gallops.  ABDOMEN: Soft, non-tender, non-distended. Bowel sounds present. No organomegaly or mass.  NEUROLOGIC: Moves all 4 extremities. PSYCHIATRIC: The patient is alert and oriented x 3.  SKIN: No obvious rash, lesion, or ulcer.   DATA REVIEW:   CBC  Recent Labs Lab 02/27/17 0524  WBC 11.5*  HGB 12.8*  HCT 37.1*  PLT 339    Chemistries   Recent Labs Lab 02/27/17 0524  NA 136  K 4.6  CL 99*  CO2 28  GLUCOSE 116*  BUN 21*  CREATININE 1.56*  CALCIUM 8.9    Cardiac Enzymes  Recent Labs Lab 02/27/17 0524  TROPONINI 13.76*    Microbiology Results  Results for orders placed or performed during the hospital encounter of 07/27/07  Culture, blood (routine x 2)     Status: None   Collection Time: 07/27/07 10:00 AM  Result Value Ref Range Status   Specimen Description BLOOD LEFT ARM  Final   Special Requests BOTTLES DRAWN AEROBIC ONLY 10CC  Final   Culture NO GROWTH 5 DAYS  Final   Report Status 08/02/2007 FINAL  Final  Culture, blood (routine x 2)     Status: None   Collection Time: 07/27/07  10:05 AM  Result Value Ref Range Status   Specimen Description BLOOD LEFT ARM  Final   Special Requests BOTTLES DRAWN AEROBIC AND ANAEROBIC 10CC EA  Final   Culture NO GROWTH 5 DAYS  Final   Report Status 08/02/2007 FINAL  Final  Urine culture     Status: None   Collection Time: 07/27/07  3:35 PM  Result Value Ref Range Status   Specimen Description URINE, RANDOM  Final   Special Requests NONE  Final   Colony Count 10,000 COLONIES/ML  Final   Culture   Final    STAPHYLOCOCCUS SPECIES (COAGULASE NEGATIVE) Note: RIFAMPIN AND GENTAMICIN SHOULD NOT BE USED AS SINGLE DRUGS FOR TREATMENT OF STAPH INFECTIONS.   Report Status 07/31/2007 FINAL  Final   Organism ID, Bacteria STAPHYLOCOCCUS SPECIES (COAGULASE NEGATIVE)  Final      Susceptibility   Staphylococcus species (coagulase negative) - MIC    GENTAMICIN <=0.5 Sensitive     LEVOFLOXACIN <=0.12 Sensitive     NITROFURANTOIN <=16 Sensitive     OXACILLIN >=4 Resistant     PENICILLIN >=0.5 Resistant     RIFAMPIN <=0.5 Sensitive     VANCOMYCIN 1 Sensitive     TETRACYCLINE 2 Sensitive   Culture, sputum-assessment     Status: None   Collection Time: 07/27/07  4:36 PM  Result Value Ref Range Status   Specimen Description SPUTUM  Final   Special Requests IMMUNE:NORM  Final   Sputum evaluation   Final    MICROSCOPIC FINDINGS SUGGEST THAT THIS SPECIMEN IS NOT REPRESENTATIVE OF LOWER RESPIRATORY SECRETIONS. PLEASE RECOLLECT. CALLED TO S.MCDOWELL RN AT 2001 BY L.PITT 07/27/07.   Report Status 07/27/2007 FINAL  Final  Rapid strep screen     Status: None   Collection Time: 07/27/07  8:08 PM  Result Value Ref Range Status   Streptococcus, Group A Screen (Direct) NEGATIVE  Final  Culture, sputum-assessment     Status: None   Collection Time: 07/28/07 12:40 PM  Result Value Ref Range Status   Specimen Description SPUTUM  Final   Special Requests IMMUNE:NORM  Final   Sputum evaluation   Final    THIS SPECIMEN IS ACCEPTABLE. RESPIRATORY CULTURE  REPORT TO FOLLOW.   Report Status 07/28/2007 FINAL  Final  Gram stain     Status: None   Collection Time: 07/28/07 12:40 PM  Result Value Ref Range Status   Specimen Description SPUTUM  Final   Special Requests IMMUNE:NORM  Final   Gram Stain   Final    MODERATE WBC PRESENT, PREDOMINANTLY PMN FEW GRAM POSITIVE COCCI IN PAIRS   Report Status 07/28/2007 FINAL  Final  Culture, respiratory     Status: None   Collection  Time: 07/28/07 12:40 PM  Result Value Ref Range Status   Specimen Description SPUTUM  Final   Special Requests NONE  Final   Gram Stain   Final    MODERATE WBC PRESENT, PREDOMINANTLY PMN FEW GRAM POSITIVE COCCI IN PAIRS   Culture NORMAL OROPHARYNGEAL FLORA  Final   Report Status 07/31/2007 FINAL  Final    RADIOLOGY:  Dg Chest Port 1 View  Result Date: 02/26/2017 CLINICAL DATA:  Shortness of breath, chest pain, tachycardia EXAM: PORTABLE CHEST 1 VIEW COMPARISON:  Portable exam 1728 hours compared to 05/02/2014 FINDINGS: Normal heart size, mediastinal contours, and pulmonary vascularity. LEFT basilar atelectasis. Lungs otherwise clear. No pleural effusion or pneumothorax. Bones demineralized. IMPRESSION: Mild LEFT basilar atelectasis. Electronically Signed   By: Ulyses Southward M.D.   On: 02/26/2017 17:46    Follow up with PCP in 1 week.  Management plans discussed with the patient, family and they are in agreement.  CODE STATUS:     Code Status Orders        Start     Ordered   02/26/17 2053  Full code  Continuous     02/26/17 2052    Code Status History    Date Active Date Inactive Code Status Order ID Comments User Context   This patient has a current code status but no historical code status.      TOTAL TIME TAKING CARE OF THIS PATIENT ON DAY OF DISCHARGE: more than 30 minutes.   Milagros Loll R M.D on 02/27/2017 at 1:57 PM  Between 7am to 6pm - Pager - [phone]  After 6pm go to [email or web address] - password [email or web address]  [facility] [place] [facility]   Office  [phone]  CC: Primary care physician; Patient, [name] Per  Note: This dictation was prepared with Dragon dictation along with smaller phrase technology. Any transcriptional errors that result from this process are unintentional.

## 2017-02-27 NOTE — Progress Notes (Signed)
Patient arrived to 10E17 via EMS from [facility].  Telemetry monitor applied and CCMD notified.  Patient oriented to unit and room to include call light and phone.  Will continue to monitor.

## 2017-02-27 NOTE — Progress Notes (Signed)
Patient discharged from specials to [facility] for CABG post positive cardiac catheterization.

## 2017-02-28 ENCOUNTER — Inpatient Hospital Stay (HOSPITAL_COMMUNITY): Payer: Medicaid Other

## 2017-02-28 DIAGNOSIS — I214 Non-ST elevation (NSTEMI) myocardial infarction: Principal | ICD-10-CM

## 2017-02-28 DIAGNOSIS — I340 Nonrheumatic mitral (valve) insufficiency: Secondary | ICD-10-CM

## 2017-02-28 DIAGNOSIS — I361 Nonrheumatic tricuspid (valve) insufficiency: Secondary | ICD-10-CM

## 2017-02-28 LAB — HIV ANTIBODY (ROUTINE TESTING W REFLEX): HIV SCREEN 4TH GENERATION: NONREACTIVE

## 2017-02-28 LAB — ECHOCARDIOGRAM COMPLETE
HEIGHTINCHES: 73 in
WEIGHTICAEL: 3886.4 [oz_av]

## 2017-02-28 LAB — HEMOGLOBIN A1C
Hgb A1c MFr Bld: 5.8 % — ABNORMAL HIGH (ref 4.8–5.6)
Mean Plasma Glucose: 119.76 mg/dL

## 2017-02-28 LAB — HEPARIN LEVEL (UNFRACTIONATED): Heparin Unfractionated: <0.1 IU/mL — ABNORMAL LOW (ref 0.30–0.70)

## 2017-02-28 LAB — CBC
HCT: 36.9 % — ABNORMAL LOW (ref 39.0–52.0)
HEMOGLOBIN: 11.9 g/dL — AB (ref 13.0–17.0)
MCH: 30.6 pg (ref 26.0–34.0)
MCHC: 32.2 g/dL (ref 30.0–36.0)
MCV: 94.9 fL (ref 78.0–100.0)
Platelets: 375 10*3/uL (ref 150–400)
RBC: 3.89 MIL/uL — AB (ref 4.22–5.81)
RDW: 12.7 % (ref 11.5–15.5)
WBC: 14.1 10*3/uL — ABNORMAL HIGH (ref 4.0–10.5)

## 2017-02-28 LAB — BASIC METABOLIC PANEL
ANION GAP: 8 (ref 5–15)
BUN: 21 mg/dL — ABNORMAL HIGH (ref 6–20)
CALCIUM: 8.8 mg/dL — AB (ref 8.9–10.3)
CO2: 26 mmol/L (ref 22–32)
Chloride: 101 mmol/L (ref 101–111)
Creatinine, Ser: 1.51 mg/dL — ABNORMAL HIGH (ref 0.61–1.24)
GFR, EST NON AFRICAN AMERICAN: 52 mL/min — AB (ref >60)
Glucose, Bld: 95 mg/dL (ref 65–99)
Potassium: 4.7 mmol/L (ref 3.5–5.1)
Sodium: 135 mmol/L (ref 135–145)

## 2017-02-28 MED ORDER — MUSCLE RUB 10-15 % EX CREA
TOPICAL_CREAM | CUTANEOUS | Status: DC | PRN
  Filled 2017-02-28: qty 85

## 2017-02-28 MED ORDER — PERFLUTREN LIPID MICROSPHERE
1.0000 mL | INTRAVENOUS | Status: AC | PRN
  Administered 2017-02-28: 2 mL via INTRAVENOUS
  Filled 2017-02-28: qty 10

## 2017-02-28 NOTE — Progress Notes (Signed)
Progress Note  Patient Name: [facility] Date of Encounter: 02/28/2017  Primary Cardiologist: End   Subjective   Still having some mild chest pressure this morning. Refusing labs.  Inpatient Medications    Scheduled Meds: . aspirin EC  81 mg Oral Daily  . atorvastatin  80 mg Oral q1800  . metoprolol tartrate  25 mg Oral BID  . nicotine  14 mg Transdermal Daily  . nitroGLYCERIN  0.5 inch Topical Q6H  . pantoprazole  40 mg Oral Daily  . sodium chloride flush  3 mL Intravenous Q12H   Continuous Infusions: . sodium chloride    . heparin 1,850 Units/hr (02/28/17 0658)   PRN Meds: sodium chloride, acetaminophen, nitroGLYCERIN, ondansetron (ZOFRAN) IV, sodium chloride flush   Vital Signs    Vitals:   02/27/17 2000 02/27/17 2136 02/28/17 0400 02/28/17 0906  BP:  140/88  131/80  Pulse:  90  79  Resp: 20   18  Temp: 98.4 F (36.9 C)  98.9 F (37.2 C) 98.5 F (36.9 C)  TempSrc: Oral  Oral Oral  SpO2:   98% 96%  Weight:      Height:        Intake/Output Summary (Last 24 hours) at 02/28/17 0919 Last data filed at 02/28/17 1478  Gross per 24 hour  Intake              205 ml  Output                0 ml  Net              205 ml   Filed Weights   02/27/17 1511  Weight: 242 lb 14.4 oz (110.2 kg)    Telemetry    SR - Personally Reviewed  ECG    SR with continued ST changes in the inferolateral leads - Personally Reviewed  Physical Exam   General: Well developed, well nourished, male appearing in no acute distress. Head: Normocephalic, atraumatic.  Neck: Supple without bruits, JVD. Lungs:  Resp regular and unlabored, CTA. Heart: RRR, S1, S2, no S3, S4, or murmur; no rub. Abdomen: Soft, non-tender, non-distended with normoactive bowel sounds. No hepatomegaly. No rebound/guarding. No obvious abdominal masses. Extremities: No clubbing, cyanosis, edema. Distal pedal pulses are 2+ bilaterally. Right radial cath site stable.  Neuro: Alert and oriented X 3.  Moves all extremities spontaneously. Psych: Normal affect.  Labs    Chemistry Recent Labs Lab 02/26/17 1723 02/27/17 0524 02/28/17 0017  NA 134* 136 135  K 5.0 4.6 4.7  CL 95* 99* 101  CO2 GLUCOSE 134* 116* 95  BUN 17 21* 21*  CREATININE 1.31* 1.56* 1.51*  CALCIUM 9.9 8.9 8.8*  GFRNONAA >60 50* 52*  GFRAA >60 58* >60  ANIONGAP Hematology Recent Labs Lab 02/26/17 1723 02/27/17 0524 02/28/17 0017  WBC 17.1* 11.5* 14.1*  RBC 4.63 4.00* 3.89*  HGB 14.4 12.8* 11.9*  HCT 42.5 37.1* 36.9*  MCV 91.7 92.8 94.9  MCH 31.1 32.0 30.6  MCHC 33.9 34.5 32.2  RDW 13.0 12.9 12.7  PLT 383 339 375    Cardiac Enzymes Recent Labs Lab 02/26/17 1723 02/26/17 2320 02/27/17 0524  TROPONINI 13.83* 16.08* 13.76*   No results for input(s): TROPIPOC in the last 168 hours.   BNPNo results for input(s): BNP, PROBNP in the last 168 hours.   DDimer No results for input(s): DDIMER in the last 168 hours.  Radiology    Dg Chest Port 1 View  Result Date: 02/26/2017 CLINICAL DATA:  Shortness of breath, chest pain, tachycardia EXAM: PORTABLE CHEST 1 VIEW COMPARISON:  Portable exam 1728 hours compared to 05/02/2014 FINDINGS: Normal heart size, mediastinal contours, and pulmonary vascularity. LEFT basilar atelectasis. Lungs otherwise clear. No pleural effusion or pneumothorax. Bones demineralized. IMPRESSION: Mild LEFT basilar atelectasis. Electronically Signed   By: Ulyses Southward M.D.   On: 02/26/2017 17:46    Cardiac Studies   Cath: 02/27/17  Conclusion   Conclusions: 1. Severe 3-vessel coronary artery disease, including sequential proximal and mid LAD lesions of 70-80% as well as diffuse 70-90% distal LAD disease, 80% ramus stenosis, 70% mid LCx disease, and occluded proximal RCA with faint collateral filling of the distal branches. 2. Moderately elevated left ventricular filling pressure (LVEDP 25 mmHg).  Recommendations: 1. Transfer to [facility] for surgical  evaluation, though his surgical targets are suboptimal. I suspect the proximal RCA lesion was his culprit. However, given that his symptoms began >3 days ago, he is currently chest pain free, and his troponin has peaked, there is no indication for urgent PCI at this time. 2. Aggressive secondary prevention. 3. Gentle diuresis. 4. Obtain transthoracic echocardiogram.  Yvonne Kendall, MD   Patient Profile     51 y.o. male with history of hypertension, GERD, tobacco abuse, alcohol abuse, obesity, and family history of premature episodic cardiovascular disease, admitted with an STEMI. Underwent cath with Dr. Okey Dupre with diffuse disease in the LAD, and Lcx. Transferred to [facility] for TCTS consult.   Assessment & Plan    1. NSTEMI: Presented to [facility] with chest pain and called a STEMI. Underwent cath with Dr. Okey Dupre noted above with diffuse disease in the LAD and Lcx. Trop peaked at 16.08. Seen by TCTS (Dr. Laneta Simmers) and determined not a candidate for bypass as there were no good targets. He has continued to have mild chest discomfort throughout the evening into this morning. Remains on IV heparin at this time, but refusing to have heparin labs drawn. Plan at this time appears to be medical therapy. Echo is pending for LV function. Does not appear to be volume overloaded on exam.  -- ASA, BB, statin, nitro paste  2. HTN: Controlled with current therapy  3. HL: LDL 108, on statin therapy  4. Tobacco/ETOH use: cessation encouraged  Signed, Laverda Page, NP  02/28/2017, 9:19 AM  Pager # [phone]    Agree with note by Laverda Page NP-C  Mr. Symes is due to post inferior MI. He underwent cardiac catheterization 2 days ago by Dr. end revealing an occluded proximal RCA with diffuse LAD and circumflex disease. His troponin peaked at 13. An LV gram was not performed nor was 2-D echo although this has been ordered. Additional history tobacco abuse and family history. He continues to have chest pain which  I suspect is post-MI pericarditis. I hear a rub on exam. He was seen in consultation by Dr. Laneta Simmers from [facility] who did not feel he was a good bypass candidate because of poor targets which I agree with. I recommend optimal interventional therapy as well as probable medical therapy for LV dysfunction which will be based on results of 2-D echo. I stressed the importance of smoking cessation. We will stop his IV heparin today.  Runell Gess, M.D., FACP, [facility], [facility] [address] [place], [place]  [address]  [phone] 02/28/2017 10:19 AM   For questions or  updates, please contact [facility] Please consult [email or web address] for contact info under Cardiology/STEMI. Daytime calls, contact the Day Call APP (6a-8a) or assigned team (Teams A-D) provider (7:30a - 5p). All other daytime calls (7:30-5p), contact the Card Master @ [phone].   Nighttime calls, contact the assigned APP (5p-8p) or MD (6:30p-8p). Overnight calls (8p-6a), contact the on call Fellow @ [phone].

## 2017-02-28 NOTE — Progress Notes (Signed)
CARDIAC REHAB PHASE I   PRE:  Rate/Rhythm: 90 Sr with PACs    BP: sitting 145/95    SaO2: 98 RA  MODE:  Ambulation: 240 ft   POST:  Rate/Rhythm: 98 SR    BP: sitting 142/95     SaO2: 96 RA  Pt sts he has very mild residual chest tightness, like indigestion. Sts it is like his admit sx but less. Able to slowly walk. Has gout in left foot so limped slightly. Apparently gout is a chronic issue for him although it sounds like getting meds for it is difficult. He does not work and is sedentary many days. His chest tightness did not change with walking, sts "its not too bad". To recliner after walk. Biggest c/o is feeling SOB at rest and walking. I began education on MI, restrictions, meds, smoking cessation, and low sodium. He has quit smoking in the past and is thinking about quitting again. I gave him a fake cigarette which he enjoyed. He sts he is a "salt-aholic". Discussed cutting out salt shaker. 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 I gave him the MI video, smoking cessation video, and HF video to watch.  Will await echo to discuss further. I discussed case with CM (no meds PTA, little income). Will f/u tomorrow. [phone]  Harriet Masson CES, ACSM 02/28/2017 11:52 AM

## 2017-02-28 NOTE — Progress Notes (Addendum)
ANTICOAGULATION CONSULT NOTE - Follow Up Consult  Pharmacy Consult for Heparin  Indication: chest pain/ACS  Allergies  Allergen Reactions  . Penicillins Rash    Has patient had a PCN reaction causing immediate rash, facial/tongue/throat swelling, SOB or lightheadedness with hypotension: Yes Has patient had a PCN reaction causing severe rash involving mucus membranes or skin necrosis: No Has patient had a PCN reaction that required hospitalization: No Has patient had a PCN reaction occurring within the last 10 years: No If all of the above answers are "NO", then may proceed with Cephalosporin use.    Patient Measurements: Height:  (185.4 cm) Weight: 242 lb 14.4 oz (110.2 kg) IBW/kg (Calculated) : 79.9  Vital Signs: Temp: 98.5 F (36.9 C) (10/10 0906) Temp Source: Oral (10/10 0906) BP: 131/80 (10/10 0906) Pulse Rate: 79 (10/10 0906)  Labs:  Recent Labs  02/26/17 1723 02/26/17 1900 02/26/17 2320 02/27/17 0524 02/27/17 0759 02/28/17 0017  HGB 14.4  --   --  12.8*  --  11.9*  HCT 42.5  --   --  37.1*  --  36.9*  PLT 383  --   --  339  --  375  APTT  --  33  --   --   --   --   LABPROT  --  13.9  --   --   --   --   INR  --  1.08  --   --   --   --   HEPARINUNFRC <0.10*  --   --   --  0.27* <0.10*  CREATININE 1.31*  --   --  1.56*  --  1.51*  TROPONINI 13.83*  --  16.08* 13.76*  --   --     Estimated Creatinine Clearance: 76.2 mL/min (A) (by C-G formula based on SCr of 1.51 mg/dL (H)).   Assessment: 51 y/o M who had been sub-therapeutic heparin level after re-start s/p cath. CVTS consult recommends medical therapy. No issues per RN.   Patient has refused heparin level draw this morning for 1000 level recheck. Discussed with cardiology team and will continue with medical management, stopping heparin infusion at this time.   Girard Cooter, PharmD Clinical Pharmacist  Phone: [phone] 02/28/2017,10:20 AM

## 2017-02-28 NOTE — Progress Notes (Signed)
ANTICOAGULATION CONSULT NOTE - Follow Up Consult  Pharmacy Consult for Heparin  Indication: chest pain/ACS  Allergies  Allergen Reactions  . Penicillins Rash    Has patient had a PCN reaction causing immediate rash, facial/tongue/throat swelling, SOB or lightheadedness with hypotension: Yes Has patient had a PCN reaction causing severe rash involving mucus membranes or skin necrosis: No Has patient had a PCN reaction that required hospitalization: No Has patient had a PCN reaction occurring within the last 10 years: No If all of the above answers are "NO", then may proceed with Cephalosporin use.    Patient Measurements: Height:  (185.4 cm) Weight: 242 lb 14.4 oz (110.2 kg) IBW/kg (Calculated) : 79.9  Vital Signs: Temp: 98.4 F (36.9 C) (10/09 2000) Temp Source: Oral (10/09 2000) BP: 140/88 (10/09 2136) Pulse Rate: 90 (10/09 2136)  Labs:  Recent Labs  02/26/17 1723 02/26/17 1900 02/26/17 2320 02/27/17 0524 02/27/17 0759 02/28/17 0017  HGB 14.4  --   --  12.8*  --  11.9*  HCT 42.5  --   --  37.1*  --  36.9*  PLT 383  --   --  339  --  375  APTT  --  33  --   --   --   --   LABPROT  --  13.9  --   --   --   --   INR  --  1.08  --   --   --   --   HEPARINUNFRC <0.10*  --   --   --  0.27* <0.10*  CREATININE 1.31*  --   --  1.56*  --  1.51*  TROPONINI 13.83*  --  16.08* 13.76*  --   --     Estimated Creatinine Clearance: 76.2 mL/min (A) (by C-G formula based on SCr of 1.51 mg/dL (H)).   Assessment: 51 y/o M with sub-therapeutic heparin level after re-start s/p cath. CVTS consult recommends medical therapy. No issues per RN.   Goal of Therapy:  Heparin level 0.3-0.7 units/ml Monitor platelets by anticoagulation protocol: Yes   Plan:  -Inc heparin to 1850 units/hr -1000 HL  Alisen Marsiglia 02/28/2017,2:09 AM

## 2017-02-28 NOTE — Progress Notes (Signed)
  Echocardiogram 2D Echocardiogram has been performed.  Bradley Bridges 02/28/2017, 1:39 PM

## 2017-03-01 DIAGNOSIS — I519 Heart disease, unspecified: Secondary | ICD-10-CM

## 2017-03-01 DIAGNOSIS — I255 Ischemic cardiomyopathy: Secondary | ICD-10-CM

## 2017-03-01 LAB — CBC
HEMATOCRIT: 34.8 % — AB (ref 39.0–52.0)
HEMOGLOBIN: 11.4 g/dL — AB (ref 13.0–17.0)
MCH: 30.6 pg (ref 26.0–34.0)
MCHC: 32.8 g/dL (ref 30.0–36.0)
MCV: 93.5 fL (ref 78.0–100.0)
PLATELETS: 355 10*3/uL (ref 150–400)
RBC: 3.72 MIL/uL — AB (ref 4.22–5.81)
RDW: 12.7 % (ref 11.5–15.5)
WBC: 11.4 10*3/uL — AB (ref 4.0–10.5)

## 2017-03-01 LAB — BRAIN NATRIURETIC PEPTIDE: B Natriuretic Peptide: 361.3 pg/mL — ABNORMAL HIGH (ref 0.0–100.0)

## 2017-03-01 MED ORDER — ISOSORBIDE MONONITRATE ER 30 MG PO TB24
30.0000 mg | ORAL_TABLET | Freq: Every day | ORAL | Status: DC
  Administered 2017-03-01 – 2017-03-02 (×2): 30 mg via ORAL
  Filled 2017-03-01 (×2): qty 1

## 2017-03-01 MED ORDER — FUROSEMIDE 10 MG/ML IJ SOLN
40.0000 mg | Freq: Once | INTRAMUSCULAR | Status: AC
  Administered 2017-03-01: 40 mg via INTRAVENOUS
  Filled 2017-03-01: qty 4

## 2017-03-01 MED ORDER — CARVEDILOL 3.125 MG PO TABS: 3.1250 mg | ORAL_TABLET | Freq: Two times a day (BID) | ORAL | Status: DC

## 2017-03-01 MED ORDER — CARVEDILOL 3.125 MG PO TABS
3.1250 mg | ORAL_TABLET | Freq: Two times a day (BID) | ORAL | Status: DC
  Administered 2017-03-01 – 2017-03-02 (×3): 3.125 mg via ORAL
  Filled 2017-03-01 (×3): qty 1

## 2017-03-01 NOTE — Progress Notes (Signed)
Progress Note  Patient Name: Bradley Bridges - [facility] Date of Encounter: 03/01/2017  Primary Cardiologist: End  Subjective   No further chest pain, but was dyspneic when walking with cardiac rehab yesterday.   Inpatient Medications    Scheduled Meds: . aspirin EC  81 mg Oral Daily  . atorvastatin  80 mg Oral q1800  . metoprolol tartrate  25 mg Oral BID  . nicotine  14 mg Transdermal Daily  . nitroGLYCERIN  0.5 inch Topical Q6H  . pantoprazole  40 mg Oral Daily  . sodium chloride flush  3 mL Intravenous Q12H   Continuous Infusions: . sodium chloride     PRN Meds: sodium chloride, acetaminophen, MUSCLE RUB, nitroGLYCERIN, ondansetron (ZOFRAN) IV, sodium chloride flush   Vital Signs    Vitals:   02/28/17 0906 02/28/17 1336 02/28/17 1938 03/01/17 0451  BP: 131/80 131/89 (!) 158/93 121/88  Pulse: 79 78 79 (!) 57  Resp: 18 18 (!) 21 14  Temp: 98.5 F (36.9 C) 98.4 F (36.9 C) 98.1 F (36.7 C) 97.8 F (36.6 C)  TempSrc: Oral Oral Oral Oral  SpO2: 96% 95% 99% 99%  Weight:      Height:        Intake/Output Summary (Last 24 hours) at 03/01/17 0948 Last data filed at 03/01/17 1610  Gross per 24 hour  Intake              240 ml  Output                0 ml  Net              240 ml   Filed Weights   02/27/17 1511  Weight: 242 lb 14.4 oz (110.2 kg)    Telemetry    SR - Personally Reviewed  Physical Exam   General: Well developed, well nourished, male appearing in no acute distress. Head: Normocephalic, atraumatic.  Neck: Supple without bruits, JVD. Lungs:  Resp regular and unlabored, mild . Heart: RRR, S1, S2, no S3, S4, or murmur; no rub. Abdomen: Soft, non-tender, non-distended with normoactive bowel sounds. No hepatomegaly. No rebound/guarding. No obvious abdominal masses. Extremities: No clubbing, cyanosis, edema. Distal pedal pulses are 2+ bilaterally. Neuro: Alert and oriented X 3. Moves all extremities spontaneously. Psych: Normal affect.  Labs      Chemistry Recent Labs Lab 02/26/17 1723 02/27/17 0524 02/28/17 0017  NA 134* 136 135  K 5.0 4.6 4.7  CL 95* 99* 101  CO2 GLUCOSE 134* 116* 95  BUN 17 21* 21*  CREATININE 1.31* 1.56* 1.51*  CALCIUM 9.9 8.9 8.8*  GFRNONAA >60 50* 52*  GFRAA >60 58* >60  ANIONGAP Hematology Recent Labs Lab 02/27/17 0524 02/28/17 0017 03/01/17 0240  WBC 11.5* 14.1* 11.4*  RBC 4.00* 3.89* 3.72*  HGB 12.8* 11.9* 11.4*  HCT 37.1* 36.9* 34.8*  MCV 92.8 94.9 93.5  MCH 32.0 30.6 30.6  MCHC 34.5 32.2 32.8  RDW 12.9 12.7 12.7  PLT 339 375 355    Cardiac Enzymes Recent Labs Lab 02/26/17 1723 02/26/17 2320 02/27/17 0524  TROPONINI 13.83* 16.08* 13.76*   No results for input(s): TROPIPOC in the last 168 hours.   BNPNo results for input(s): BNP, PROBNP in the last 168 hours.   DDimer No results for input(s): DDIMER in the last 168 hours.    Radiology    No results found.  Cardiac Studies   Cath: 02/27/17  Conclusion   Conclusions: 1. Severe 3-vessel coronary artery disease, including sequential proximal and mid LAD lesions of 70-80% as well as diffuse 70-90% distal LAD disease, 80% ramus stenosis, 70% mid LCx disease, and occluded proximal RCA with faint collateral filling of the distal branches. 2. Moderately elevated left ventricular filling pressure (LVEDP 25 mmHg).  Recommendations: 1. Transfer to [facility] for surgical evaluation, though his surgical targets are suboptimal. I suspect the proximal RCA lesion was his culprit. However, given that his symptoms began >3 days ago, he is currently chest pain free, and his troponin has peaked, there is no indication for urgent PCI at this time. 2. Aggressive secondary prevention. 3. Gentle diuresis. 4. Obtain transthoracic echocardiogram.  Yvonne Kendall, MD   TTE: 02/28/17  Study Conclusions  - Left ventricle: The cavity size was normal. Systolic function was   severely reduced. The estimated  ejection fraction was in the   range of 20% to 25%. Diffuse hypokinesis. The study is not   technically sufficient to allow evaluation of LV diastolic   function. Acoustic contrast opacification revealed no evidence   ofthrombus. - Mitral valve: There was mild regurgitation. - Right atrium: The atrium was mildly dilated. - Tricuspid valve: There was mild regurgitation.  Recommendations:  When compared to prior, EF is reduced (prior 55%).  Patient Profile     51 y.o. male with history of hypertension, GERD, tobacco abuse, alcohol abuse, obesity, and family history of premature episodic cardiovascular disease, admitted with an STEMI. Underwent cath with Dr. Okey Dupre with diffuse disease in the LAD, and Lcx. Transferred to [facility] for TCTS consult. Not a bypass candidate, and newly reduced EF of 20-25%.  Assessment & Plan    1. NSTEMI: Presented to [facility] with chest pain and called a STEMI. Underwent cath with Dr. Okey Dupre noted above with diffuse disease in the LAD and Lcx. Trop peaked at 16.08. Seen by TCTS (Dr. Laneta Simmers) and determined not a candidate for bypass as there were no good targets. Plan is to optimize medical therapy. IV heparin was stopped yesterday. Has been on nitro paste, will transition to Imdur today. No further chest pain.  -- on ASA, statin, BB, Imdur  2. Acute systolic HF: Echo shows severely reduced EF of 20-25% with diffuse hypokinesis. Does not appear significantly volume overloaded on exam, but was dyspneic while ambulating with cardiac rehab yesterday. Suspect he will need some diuresis. Will give  IV lasix x1 today. Follow up BMET in the am. Start  oral lasix tomorrow as long as renal function stable. Check BNP now. Considered LifeVest but he does not have insurance.  -- switch metoprolol to coreg 3.125mg  BID. No ACEi/ARB in the setting of elevated Cr.   3. HTN: Controlled with current therapy  4. HL: LDL 108, on statin therapy  5. Tobacco/ETOH use: cessation  discussed with the patient. Currently using nicotine patch.   Signed, Laverda Page, NP  03/01/2017, 9:48 AM  Pager # [phone]   Agree with note by Laverda Page NP-C  No chest pain this morning. He did complain of some dyspnea on exertion yesterday. His medications were adjusted and consolidated. This exam is benign. We will empirically diurese him and begin him on low-dose oral diuretic with anticipation of discharge tomorrow.   Runell Gess, M.D., FACP, [facility], [place] [facility] [address] [place], [place]  [address]  [phone] 03/01/2017 10:56 AM   For questions or updates, please contact [facility] Please consult  [email or web address] for contact info under Cardiology/STEMI. Daytime calls, contact the Day Call APP (6a-8a) or assigned team (Teams A-D) provider (7:30a - 5p). All other daytime calls (7:30-5p), contact the Card Master @ [phone].   Nighttime calls, contact the assigned APP (5p-8p) or MD (6:30p-8p). Overnight calls (8p-6a), contact the on call Fellow @ [phone].

## 2017-03-01 NOTE — Care Management Note (Signed)
Case Management Note Donn Pierini RN, BSN Unit 4E-Case Manager [phone]  Patient Details  Name: Bradley Bridges MRN: [id number] Date of Birth: 1965-09-26  Subjective/Objective:   Pt admitted with NSTEMI- MVD- per surgery consult not a CABG candidate  - plan to tx medically               Action/Plan: PTA pt lived at home- referral for medication needs- spoke with pt at bedside- per conversation pt states that he has been seen by the [facility] in past- encouraged pt to re-establish there- also provided pt with info for [facility] along with info on [facility] for PCP needs- CM reviewed patients med list- most medications will be $4- will again review prior to discharge- pt would be eligible for [facility] if needed- program explained to pt - one time use for discharge with $3 copay per script. CM will f/u prior to discharge for final needs.   Expected Discharge Date:                  Expected Discharge Plan:  Home/Self Care  In-House Referral:     Discharge planning Services  CM Consult, Medication Assistance, Indigent Health Clinic  Post Acute Care Choice:    Choice offered to:     DME Arranged:    DME Agency:     HH Arranged:    HH Agency:     Status of Service:  In process, will continue to follow  If discussed at Long Length of Stay Meetings, dates discussed:    Discharge Disposition:   Additional Comments:  Darrold Span, RN 03/01/2017, 2:41 PM

## 2017-03-01 NOTE — Progress Notes (Signed)
CARDIAC REHAB PHASE I   PRE:  Rate/Rhythm: 80 SR    BP: sitting 173/101    SaO2: 99 RA  MODE:  Ambulation: 470 ft   POST:  Rate/Rhythm: 87 SR with PACs    BP: sitting 165/96     SaO2: 98 RA  Pt feeling better today. Able to slowly walk hall without SOB or CP. Increased distance. Walks slow. BP elevated. Long discussion of HF, low sodium, daily wts, ex, NTG, and CRPII. We also discussed ETOH and decreasing amount. He sts he drinks to help with pain of gout. I have encouraged him to discuss his gout with MD as he is not on any meds. Will refer to [place] [facility] although it is unlikely he will be able to do due to lack of transport or insurance. Sister was present and supportive today.  [phone]   Harriet Masson CES, ACSM 03/01/2017 11:39 AM

## 2017-03-02 ENCOUNTER — Other Ambulatory Visit: Payer: Self-pay | Admitting: Physician Assistant

## 2017-03-02 ENCOUNTER — Telehealth: Payer: Self-pay | Admitting: *Deleted

## 2017-03-02 DIAGNOSIS — I213 ST elevation (STEMI) myocardial infarction of unspecified site: Secondary | ICD-10-CM

## 2017-03-02 DIAGNOSIS — Z720 Tobacco use: Secondary | ICD-10-CM

## 2017-03-02 DIAGNOSIS — N179 Acute kidney failure, unspecified: Secondary | ICD-10-CM

## 2017-03-02 LAB — BASIC METABOLIC PANEL
ANION GAP: 11 (ref 5–15)
BUN: 17 mg/dL (ref 6–20)
CALCIUM: 9.1 mg/dL (ref 8.9–10.3)
CHLORIDE: 102 mmol/L (ref 101–111)
CO2: 23 mmol/L (ref 22–32)
Creatinine, Ser: 1.3 mg/dL — ABNORMAL HIGH (ref 0.61–1.24)
GFR calc non Af Amer: >60 mL/min (ref >60)
GLUCOSE: 83 mg/dL (ref 65–99)
POTASSIUM: 4.1 mmol/L (ref 3.5–5.1)
Sodium: 136 mmol/L (ref 135–145)

## 2017-03-02 LAB — CBC
HEMATOCRIT: 34.8 % — AB (ref 39.0–52.0)
HEMOGLOBIN: 11.5 g/dL — AB (ref 13.0–17.0)
MCH: 30.6 pg (ref 26.0–34.0)
MCHC: 33 g/dL (ref 30.0–36.0)
MCV: 92.6 fL (ref 78.0–100.0)
Platelets: 401 10*3/uL — ABNORMAL HIGH (ref 150–400)
RBC: 3.76 MIL/uL — AB (ref 4.22–5.81)
RDW: 12.4 % (ref 11.5–15.5)
WBC: 12.3 10*3/uL — AB (ref 4.0–10.5)

## 2017-03-02 MED ORDER — SPIRONOLACTONE 25 MG PO TABS: 12.5000 mg | ORAL_TABLET | Freq: Every day | ORAL | 3 refills | Status: DC

## 2017-03-02 MED ORDER — NITROGLYCERIN 0.4 MG SL SUBL: 0.4000 mg | SUBLINGUAL_TABLET | SUBLINGUAL | 12 refills | Status: DC | PRN

## 2017-03-02 MED ORDER — CARVEDILOL 3.125 MG PO TABS: 3.1250 mg | ORAL_TABLET | Freq: Two times a day (BID) | ORAL | 6 refills | Status: DC

## 2017-03-02 MED ORDER — ATORVASTATIN CALCIUM 80 MG PO TABS: 80.0000 mg | ORAL_TABLET | Freq: Every day | ORAL | 11 refills | Status: DC

## 2017-03-02 MED ORDER — NICOTINE 14 MG/24HR TD PT24: 14.0000 mg | MEDICATED_PATCH | Freq: Every day | TRANSDERMAL | 0 refills | Status: DC

## 2017-03-02 MED ORDER — ISOSORBIDE MONONITRATE ER 30 MG PO TB24: 30.0000 mg | ORAL_TABLET | Freq: Every day | ORAL | 6 refills | Status: DC

## 2017-03-02 MED ORDER — ASPIRIN 81 MG PO TBEC: 81.0000 mg | DELAYED_RELEASE_TABLET | Freq: Every day | ORAL | 11 refills | Status: DC

## 2017-03-02 NOTE — Care Management Note (Signed)
Case Management Note Donn Pierini RN, BSN Unit 4E-Case Manager [phone]  Patient Details  Name: Bradley Bridges MRN: [id number] Date of Birth: 1965-05-28  Subjective/Objective:   Pt admitted with NSTEMI- MVD- per surgery consult not a CABG candidate  - plan to tx medically               Action/Plan: PTA pt lived at home- referral for medication needs- spoke with pt at bedside- per conversation pt states that he has been seen by the [facility] in past- encouraged pt to re-establish there- also provided pt with info for [facility] along with info on [facility] for PCP needs- CM reviewed patients med list- most medications will be $4- will again review prior to discharge- pt would be eligible for [facility] if needed- program explained to pt - one time use for discharge with $3 copay per script. CM will f/u prior to discharge for final needs.   Expected Discharge Date:  03/02/17               Expected Discharge Plan:  Home/Self Care  In-House Referral:     Discharge planning Services  CM Consult, Medication Assistance, Indigent Health Clinic, [facility] Program  Post Acute Care Choice:  NA Choice offered to:  NA  DME Arranged:    DME Agency:     HH Arranged:    HH Agency:     Status of Service:  Completed, signed off  If discussed at [employer] of Stay Meetings, dates discussed:    Discharge Disposition: home/self care   Additional Comments:  03/02/17- 1100- Garv Kuechle RN, CM- pt for d/c home today- pt has a few meds not on $4 list- MD concerned about pt affording meds- so CM will assist with MATCH- [facility] letter along with pharmacy list provided to pt at bedside- reviewed program with pt along with one time use and $3 copay cost per script.   Darrold Span, RN 03/02/2017, 11:16 AM

## 2017-03-02 NOTE — Progress Notes (Signed)
Progress Note  Patient Name: Bradley Bridges Date of Encounter: 03/02/2017  Primary Cardiologist: End  Subjective   No further chest pain, but was dyspneic when walking with cardiac rehab yesterday. He has been tired [place] 3 L her last several days and is feeling much better.  Inpatient Medications    Scheduled Meds: . aspirin EC  81 mg Oral Daily  . atorvastatin  80 mg Oral q1800  . carvedilol  3.125 mg Oral BID WC  . isosorbide mononitrate  30 mg Oral Daily  . nicotine  14 mg Transdermal Daily  . pantoprazole  40 mg Oral Daily  . sodium chloride flush  3 mL Intravenous Q12H   Continuous Infusions: . sodium chloride     PRN Meds: sodium chloride, acetaminophen, MUSCLE RUB, nitroGLYCERIN, ondansetron (ZOFRAN) IV, sodium chloride flush   Vital Signs    Vitals:   03/01/17 0451 03/01/17 1650 03/01/17 1937 03/02/17 0513  BP: 121/88 (!) 143/78 (!) 147/89 (!) 150/76  Pulse: (!) 57 93 92 61  Resp: Temp: 97.8 F (36.6 C) 98.2 F (36.8 C) (!) 97.3 F (36.3 C) 97.8 F (36.6 C)  TempSrc: Oral Oral Oral Oral  SpO2: 99% 96% 97% 97%  Weight:      Height:        Intake/Output Summary (Last 24 hours) at 03/02/17 0926 Last data filed at 03/02/17 0841  Gross per 24 hour  Intake              480 ml  Output             2950 ml  Net            -2470 ml   Filed Weights   02/27/17 1511  Weight: 242 lb 14.4 oz (110.2 kg)    Telemetry    SR With PVCs and PACs - Personally Reviewed  Physical Exam   General: Well developed, well nourished, male appearing in no acute distress. Head: Normocephalic, atraumatic.  Neck: Supple without bruits, JVD. Lungs:  Resp regular and unlabored, mild . Heart: RRR, S1, S2, no S3, S4, or murmur; no rub. Abdomen: Soft, non-tender, non-distended with normoactive bowel sounds. No hepatomegaly. No rebound/guarding. No obvious abdominal masses. Extremities: No clubbing, cyanosis, edema. Distal pedal pulses are 2+  bilaterally. Neuro: Alert and oriented X 3. Moves all extremities spontaneously. Psych: Normal affect.  Labs    Chemistry  Recent Labs Lab 02/27/17 0524 02/28/17 0017 03/02/17 0337  NA 136 135 136  K 4.6 4.7 4.1  CL 99* 101 102  CO2 GLUCOSE 116* 95 83  BUN 21* 21* 17  CREATININE 1.56* 1.51* 1.30*  CALCIUM 8.9 8.8* 9.1  GFRNONAA 50* 52* >60  GFRAA 58* >60 >60  ANIONGAP Hematology  Recent Labs Lab 02/28/17 0017 03/01/17 0240 03/02/17 0337  WBC 14.1* 11.4* 12.3*  RBC 3.89* 3.72* 3.76*  HGB 11.9* 11.4* 11.5*  HCT 36.9* 34.8* 34.8*  MCV 94.9 93.5 92.6  MCH 30.6 30.6 30.6  MCHC 32.2 32.8 33.0  RDW 12.7 12.7 12.4  PLT 375 355 401*    Cardiac Enzymes  Recent Labs Lab 02/26/17 1723 02/26/17 2320 02/27/17 0524  TROPONINI 13.83* 16.08* 13.76*   No results for input(s): TROPIPOC in the last 168 hours.   BNP  Recent Labs Lab 03/01/17 0240  BNP 361.3*     DDimer No results for input(s): DDIMER in the last  168 hours.    Radiology    No results found.  Cardiac Studies   Cath: 02/27/17  Conclusion   Conclusions: 1. Severe 3-vessel coronary artery disease, including sequential proximal and mid LAD lesions of 70-80% as well as diffuse 70-90% distal LAD disease, 80% ramus stenosis, 70% mid LCx disease, and occluded proximal RCA with faint collateral filling of the distal branches. 2. Moderately elevated left ventricular filling pressure (LVEDP 25 mmHg).  Recommendations: 1. Transfer to [facility] for surgical evaluation, though his surgical targets are suboptimal. I suspect the proximal RCA lesion was his culprit. However, given that his symptoms began >3 days ago, he is currently chest pain free, and his troponin has peaked, there is no indication for urgent PCI at this time. 2. Aggressive secondary prevention. 3. Gentle diuresis. 4. Obtain transthoracic echocardiogram.  Yvonne Kendall, MD   TTE: 02/28/17  Study  Conclusions  - Left ventricle: The cavity size was normal. Systolic function was   severely reduced. The estimated ejection fraction was in the   range of 20% to 25%. Diffuse hypokinesis. The study is not   technically sufficient to allow evaluation of LV diastolic   function. Acoustic contrast opacification revealed no evidence   ofthrombus. - Mitral valve: There was mild regurgitation. - Right atrium: The atrium was mildly dilated. - Tricuspid valve: There was mild regurgitation.  Recommendations:  When compared to prior, EF is reduced (prior 55%).  Patient Profile     51 y.o. male with history of hypertension, GERD, tobacco abuse, alcohol abuse, obesity, and family history of premature episodic cardiovascular disease, admitted with an STEMI. Underwent cath with Dr. Okey Dupre with diffuse disease in the LAD, and Lcx. Transferred to [facility] for TCTS consult. Not a bypass candidate, and newly reduced EF of 20-25%. Recommendation was for optimal medical therapy.  Assessment & Plan    1. NSTEMI: Presented to [facility] with chest pain and called a STEMI. Underwent cath with Dr. Okey Dupre noted above with diffuse disease in the LAD and Lcx. Trop peaked at 16.08. Seen by TCTS (Dr. Laneta Simmers) and determined not a candidate for bypass as there were no good targets. Plan is to optimize medical therapy. IV heparin was stopped yesterday. Has been on nitro paste, will transition to Imdur today. No further chest pain.  -- on ASA, statin, BB, Imdur  2. Acute systolic HF: Echo shows severely reduced EF of 20-25% with diffuse hypokinesis. Does not appear significantly volume overloaded on exam, but was dyspneic while ambulating with cardiac rehab yesterday. Suspect he will need some diuresis. Will give  IV lasix x1 today. Follow up BMET in the am. Start  oral lasix tomorrow as long as renal function stable. Check BNP now. Considered LifeVest but he does not have insurance.  -- switch metoprolol to coreg 3.125mg  BID. No  ACEi/ARB in the setting of elevated Cr. Ultimately, he will need his carvedilol titrated and initiation of an ACE inhibitor as an outpatient -- I would consider sending home on a diuretic such as spironolactone 12.5 mg a day.  3. HTN: Controlled with current therapy  4. HL: LDL 108, on statin therapy  5. Tobacco/ETOH use: cessation discussed with the patient. Currently using nicotine patch.   Bradley Bridges is stable for discharge home today. We had a long discussion about risk factor modification with regards to heart healthy diet, tobacco cessation and alcohol cessation. We will need to touch base with the case manager to assure that he cannot afford his medications. He  will need to see middle provider back in 7 days and with Dr. Okey Dupre 2-3 weeks thereafter.   Alphonsus Sias, MD  03/02/2017, 9:26 AM  Pager # [phone]   Agree with note by Laverda Page NP-C  No chest pain this morning. He did complain of some dyspnea on exertion yesterday. His medications were adjusted and consolidated. This exam is benign. We will empirically diurese him and begin him on low-dose oral diuretic with anticipation of discharge tomorrow.   Runell Gess, M.D., FACP, [facility], [place] [facility] [address] [place], [place]  [address]  [phone] 03/02/2017 9:26 AM   For questions or updates, please contact [facility] Please consult [email or web address] for contact info under Cardiology/STEMI. Daytime calls, contact the Day Call APP (6a-8a) or assigned team (Teams A-D) provider (7:30a - 5p). All other daytime calls (7:30-5p), contact the Card Master @ [phone].   Nighttime calls, contact the assigned APP (5p-8p) or MD (6:30p-8p). Overnight calls (8p-6a), contact the on call Fellow @ [phone].

## 2017-03-02 NOTE — Discharge Summary (Signed)
Discharge Summary    Patient ID: Bradley Bridges,  MRN: [id number], DOB/AGE: March 21, 1966 51 y.o.  Admit date: 02/27/2017 Discharge date: 03/02/2017  Primary Care Provider: Patient, [name] Per Primary Cardiologist: Dr. Okey Dupre @ [facility]   Discharge Diagnoses    Principal Problem:   STEMI (ST elevation myocardial infarction) (HCC)   HTN   HLD   AKI   Tobacco and alcohol abuse   ICM/Acute systolic CHF  Allergies Allergies  Allergen Reactions  . Penicillins Rash    Has patient had a PCN reaction causing immediate rash, facial/tongue/throat swelling, SOB or lightheadedness with hypotension: Yes Has patient had a PCN reaction causing severe rash involving mucus membranes or skin necrosis: No Has patient had a PCN reaction that required hospitalization: No Has patient had a PCN reaction occurring within the last 10 years: No If all of the above answers are "NO", then may proceed with Cephalosporin use.    Diagnostic Studies/Procedures    Cath: 02/27/17  Conclusion   Conclusions: 1. Severe 3-vessel coronary artery disease, including sequential proximal and mid LAD lesions of 70-80% as well as diffuse 70-90% distal LAD disease, 80% ramus stenosis, 70% mid LCx disease, and occluded proximal RCA with faint collateral filling of the distal branches. 2. Moderately elevated left ventricular filling pressure (LVEDP 25 mmHg).  Recommendations: 1. Transfer to [facility] for surgical evaluation, though his surgical targets are suboptimal. I suspect the proximal RCA lesion was his culprit. However, given that his symptoms began >3 days ago, he is currently chest pain free, and his troponin has peaked, there is no indication for urgent PCI at this time. 2. Aggressive secondary prevention. 3. Gentle diuresis. 4. Obtain transthoracic echocardiogram.  Yvonne Kendall, MD   TTE: 02/28/17  Study Conclusions  - Left ventricle: The cavity size was normal. Systolic function  was severely reduced. The estimated ejection fraction was in the range of 20% to 25%. Diffuse hypokinesis. The study is not technically sufficient to allow evaluation of LV diastolic function. Acoustic contrast opacification revealed no evidence ofthrombus. - Mitral valve: There was mild regurgitation. - Right atrium: The atrium was mildly dilated. - Tricuspid valve: There was mild regurgitation.  Recommendations: When compared to prior, EF is reduced (prior 55%).    History of Present Illness     51 y.o. male with history of hypertension, GERD, tobacco abuse, alcohol abuse, obesity, and family history of premature episodic cardiovascular disease, admitted with an STEMI.  He was in his usoh until Saturday 10/6, when he began to experience retrosternal chest discomfort w/o associated Ss. He thought that it was indigestion and so he took prilosec and tums. Symptoms were mild but persistent throughout the day and night on Saturday but worsened on Sunday. He continued to think that he was having indigestion. On 10/7, he developed dyspnea with ongoing c/p and radiation to his left arm. For that reason, he presented to the ED on the afternoon of 10/7. ECG notable for somewhat diffuse ST/T changes. Initial trop 13.83. He was admitted and placed on asa, heparin, ?blocker, and statin.   Cardiac catheterization healed occluded proximal RCA as well as severe diffuse disease involving the LAD and LCx. He is currently chest pain-free. Transferred to [facility] for consideration of CABG.   Hospital Course     Consultants: None  1. Last presenting STEMI:Presented to [facility] with chest pain. Underwent cath with Dr. Okey Dupre noted above with diffuse disease in the LAD and Lcx. Trop peaked at 16.08. Seen  by [facility] (Dr. Laneta Simmers) and determined not a candidate for bypass as there were no good targets. Plan is to optimize medical therapy. Treated with IV heparin. No further chest pain.  -- on ASA, statin,  BB, Imdur  2. Acute systolic HF: Echo shows severely reduced EF of 20-25% with diffuse hypokinesis. Does not appear significantly volume overloaded on exam, but was dyspneic while ambulating with cardiac rehab.  Given  IV lasix x1 with improved symptoms. Net I & O negative 2L.  Considered LifeVest but he does not have insurance.  -- switched metoprolol to coreg 3.125mg  BID. No ACEi/ARB in the setting of elevated Cr. Ultimately, he will need his carvedilol titrated and initiation of an ACE inhibitor as an outpatient. Started spironolactone 12.5 mg a day. BMET in one week.  3. HTN: - Controlled on current medication  4. HL: - 02/26/2017: Cholesterol 196; HDL 44; LDL Cholesterol 108; Triglycerides 218; VLDL 44  - Continue statin. LFT and lipid panel in 4-6 weeks.   5. Tobacco/ETOH use:  -Cessation discussed with the patient.   6. AKI - Scr improving  The patient has been seen by Dr. Allyson Sabal today and deemed ready for discharge home. All follow-up appointments have been scheduled. Discharge medications are listed below.  _____________   Discharge Vitals Blood pressure (!) 150/76, pulse 61, temperature 97.8 F (36.6 C), temperature source Oral, resp. rate 16, height  (1.854 m), weight 242 lb 14.4 oz (110.2 kg), SpO2 97 %.  Filed Weights   02/27/17 1511  Weight: 242 lb 14.4 oz (110.2 kg)    Labs & Radiologic Studies     CBC  Recent Labs  03/01/17 0240 03/02/17 0337  WBC 11.4* 12.3*  HGB 11.4* 11.5*  HCT 34.8* 34.8*  MCV 93.5 92.6  PLT 355 401*   Basic Metabolic Panel  Recent Labs  02/28/17 0017 03/02/17 0337  NA 135 136  K 4.7 4.1  CL 101 102  CO2 26 23  GLUCOSE 95 83  BUN 21* 17  CREATININE 1.51* 1.30*  CALCIUM 8.8* 9.1   Hemoglobin A1C  Recent Labs  02/28/17 0017  HGBA1C 5.8*     Dg Chest Port 1 View  Result Date: 02/26/2017 CLINICAL DATA:  Shortness of breath, chest pain, tachycardia EXAM: PORTABLE CHEST 1 VIEW COMPARISON:  Portable exam  1728 hours compared to 05/02/2014 FINDINGS: Normal heart size, mediastinal contours, and pulmonary vascularity. LEFT basilar atelectasis. Lungs otherwise clear. No pleural effusion or pneumothorax. Bones demineralized. IMPRESSION: Mild LEFT basilar atelectasis. Electronically Signed   By: Ulyses Southward M.D.   On: 02/26/2017 17:46    Disposition   Pt is being discharged home today in good condition.  Follow-up Plans & Appointments    Follow-up Information    Sondra Barges, PA-C. Go on 03/15/2017.   Specialties:  Physician Assistant, Cardiology, Radiology Why:  :30pm for follow up  Contact information: [address] [place] [address] [phone]          Discharge Instructions    Amb Referral to Cardiac Rehabilitation    Complete by:  As directed    Diagnosis:   Heart Failure (see criteria below if ordering Phase II) STEMI     Heart Failure Type:  Chronic Systolic & Diastolic   Diet - low sodium heart healthy    Complete by:  As directed    Discharge instructions    Complete by:  As directed    No driving for 2 weeks. No  lifting over 10 lbs for 4 weeks. No sexual activity for 4 weeks. You may not return to work until cleared by your cardiologist. Keep procedure site clean & dry. If you notice increased pain, swelling, bleeding or pus, call/return!  You may shower, but no soaking baths/hot tubs/pools for 1 week.   *Weigh yourself on the same scale at same time of day and keep a log. *Report weight gain of > 3 lbs in 1 day or 5 lbs over the course of a week and/or symptoms of excess fluid (shortness of breath, difficulty lying flat, swelling, poor appetite, abdominal fullness/bloating, etc) to your doctor immediately. *Avoid foods that are high in sodium (processed, pre-packaged/canned goods, fast foods, etc). *Please attend all scheduled and reccommended follow up appointments   Increase activity slowly    Complete by:  As directed       Discharge  Medications   Current Discharge Medication List    START taking these medications   Details  aspirin EC 81 MG EC tablet Take 1 tablet (81 mg total) by mouth daily. Qty: 30 tablet, Refills: 11    atorvastatin (LIPITOR) 80 MG tablet Take 1 tablet (80 mg total) by mouth daily at 6 PM. Qty: 30 tablet, Refills: 11    carvedilol (COREG) 3.125 MG tablet Take 1 tablet (3.125 mg total) by mouth 2 (two) times daily with a meal. Qty: 60 tablet, Refills: 6    isosorbide mononitrate (IMDUR) 30 MG 24 hr tablet Take 1 tablet (30 mg total) by mouth daily. Qty: 30 tablet, Refills: 6    nicotine (NICODERM CQ - DOSED IN MG/24 HOURS) 14 mg/24hr patch Place 1 patch (14 mg total) onto the skin daily. Qty: 28 patch, Refills: 0    nitroGLYCERIN (NITROSTAT) 0.4 MG SL tablet Place 1 tablet (0.4 mg total) under the tongue every 5 (five) minutes x 3 doses as needed for chest pain. Qty: 25 tablet, Refills: 12    spironolactone (ALDACTONE) 25 MG tablet Take 0.5 tablets (12.5 mg total) by mouth daily. Qty: 30 tablet, Refills: 3      CONTINUE these medications which have NOT CHANGED   Details  acetaminophen (TYLENOL) 500 MG tablet Take 1,000 mg by mouth every 4 (four) hours as needed.    Omeprazole Magnesium (PRILOSEC OTC PO) Take 1 capsule by mouth daily.         Aspirin prescribed at discharge?  Yes High Intensity Statin Prescribed? (Lipitor 40-80mg  or Crestor 20-40mg ): Yes Beta Blocker Prescribed? Yes For EF 45% or less, Was ACEI/ARB Prescribed? Plan to start as outpatinet ADP Receptor Inhibitor Prescribed? (i.e. Plavix etc.-Includes Medically Managed Patients): N/A- did not placed stent For EF <40%, Aldosterone Inhibitor Prescribed? Yes Was EF assessed during THIS hospitalization? Yes Was Cardiac Rehab II ordered? (Included Medically managed Patients): Yes   Outstanding Labs/Studies   BEMT in one week LFT and lipid panel in 4-6 weeks  Duration of Discharge Encounter   Greater than 30 minutes  including physician time.  Signed, Bhagat,Bhavinkumar PA-C 03/02/2017, 10:49 AM  Agree with note by Chelsea Aus PA-C  Patient with diffuse CAD and LV dysfunction not an interventional candidate. His meds optimized. He did diuresis. He is pain-free. Vital signs are stable. He stable for discharge home today. He will follow-up with Dr. Thayer Ohm End   Runell Gess, M.D., FACP, [facility], [facility] [address] [place], [place]  [address]  [phone] 03/02/2017 1:01 PM

## 2017-03-02 NOTE — Progress Notes (Signed)
Reinforced education with pt and sister. Have good understanding.  [id number] Ethelda Chick CES, ACSM 11:21 AM 03/02/2017

## 2017-03-02 NOTE — Telephone Encounter (Signed)
Admitted

## 2017-03-02 NOTE — Telephone Encounter (Signed)
-----   Message from Coralee Rud sent at 03/02/2017 10:03 AM EDT ----- Regarding: tcm/ph 10/25/ 2:30 Eula Listen, PA

## 2017-03-05 NOTE — Telephone Encounter (Signed)
Patient returning tcm call he thinks they left a msg about lab work

## 2017-03-05 NOTE — Telephone Encounter (Signed)
Patient contacted regarding discharge from [facility] on 03/02/17.   Patient understands to follow up with provider ? On 03/15/17 at 2:30pm at [facility].  Patient understands discharge instructions? Yes   Patient understands medications and regiment? Yes Patient understands to bring all medications to this visit? Yes

## 2017-03-15 ENCOUNTER — Encounter: Payer: Self-pay | Admitting: Nurse Practitioner

## 2017-03-15 ENCOUNTER — Ambulatory Visit (INDEPENDENT_AMBULATORY_CARE_PROVIDER_SITE_OTHER): Payer: Self-pay | Admitting: Nurse Practitioner

## 2017-03-15 VITALS — BP 110/82 | HR 97 | Ht 73.0 in | Wt 231.5 lb

## 2017-03-15 DIAGNOSIS — I5022 Chronic systolic (congestive) heart failure: Secondary | ICD-10-CM

## 2017-03-15 DIAGNOSIS — F1010 Alcohol abuse, uncomplicated: Secondary | ICD-10-CM

## 2017-03-15 DIAGNOSIS — E785 Hyperlipidemia, unspecified: Secondary | ICD-10-CM

## 2017-03-15 DIAGNOSIS — Z720 Tobacco use: Secondary | ICD-10-CM

## 2017-03-15 DIAGNOSIS — I255 Ischemic cardiomyopathy: Secondary | ICD-10-CM

## 2017-03-15 DIAGNOSIS — I214 Non-ST elevation (NSTEMI) myocardial infarction: Secondary | ICD-10-CM

## 2017-03-15 DIAGNOSIS — I10 Essential (primary) hypertension: Secondary | ICD-10-CM

## 2017-03-15 MED ORDER — CARVEDILOL 6.25 MG PO TABS: 6.2500 mg | ORAL_TABLET | Freq: Two times a day (BID) | ORAL | 3 refills | Status: DC

## 2017-03-15 NOTE — Progress Notes (Signed)
Office Visit    Patient Name: Bradley Bridges Date of Encounter: 03/15/2017  Primary Care Provider:  Patient, [name] Per Primary Cardiologist:  Bradley Cullens. End, MD   Chief Complaint    51 y/o ? with a history of alcohol abuse, tobacco abuse, hypertension, obesity, and recent admission with late presenting STEMI/non-STEMI, finding of multivessel disease, and ischemic cardiomyopathy.  Past Medical History    Past Medical History:  Diagnosis Date  . Chest pain 02/2017  . Coronary artery disease    a. 02/2017 NSTEMI/Cath: LM 25, LAD 25ost, 70p, 3223m, 70/90d, D1/2 mod dzs, RI 80, LCX 6669m, OM1 small, OM2 mod dzs, RCA 100p, RPDA fills via L->R collats.  Marland Kitchen. ETOH abuse   . GERD (gastroesophageal reflux disease)    a. x 20 yrs  . Gout   . HFrEF (heart failure with reduced ejection fraction) (HCC)    a. 02/2017 Echo: EF 20-25%, diff HK, mild MR, mildly dil RA, mild TR.  Marland Kitchen. Hypertension    a. x 20 yrs - no meds since 2017.  . Ischemic cardiomyopathy    a. 02/2017 Echo: EF 20-25%, diff HK.  . NSTEMI (non-ST elevated myocardial infarction) (HCC)    a. 02/2017--sev 3VD-->turned down for CABG-->Med Rx.  . Obesity   . Tobacco abuse    Past Surgical History:  Procedure Laterality Date  . LEFT HEART CATH AND CORONARY ANGIOGRAPHY N/A 02/27/2017   Procedure: LEFT HEART CATH AND CORONARY ANGIOGRAPHY;  Surgeon: Bradley Bridges, Bradley Afshar, MD;  Location: [facility] INVASIVE CV LAB;  Service: Cardiovascular;  Laterality: N/A;    Allergies  Allergies  Allergen Reactions  . Penicillins Rash    Has patient had a PCN reaction causing immediate rash, facial/tongue/throat swelling, SOB or lightheadedness with hypotension: Yes Has patient had a PCN reaction causing severe rash involving mucus membranes or skin necrosis: No Has patient had a PCN reaction that required hospitalization: No Has patient had a PCN reaction occurring within the last 10 years: No If all of the above answers are "NO", then may proceed with  Cephalosporin use.    History of Present Illness    51 year old male with the above complex past medical history including hypertension, obesity, tobacco abuse, and alcohol abuse.  He recently presented to [facility] with a 2-day history chest and abdominal discomfort with radiation to his arms.  On presentation, he was noted to have ST segment changes concerning for late presenting STEMI.  Troponin was elevated.  He was taken to the catheterization laboratory which revealed significant multivessel disease.  [facility] and was felt to be a poor candidate secondary to tobacco abuse and LV dysfunction.  Medical therapy was recommended.  He was subsequently placed on aspirin, statin, beta-blocker, nitrate, and Spironolactone therapy.  Echocardiogram showed an EF of 20-25% with diffuse hypokinesis.  Since his discharge, he  had one episode of chest pain which was short-lived.  He has not had any significant dyspnea.  He has had fatigue.  He has been weighing himself daily and is down 4 pounds since discharge.  He denies PND, orthopnea, dizziness, syncope, edema, or early satiety.  Home Medications    Prior to Admission medications   Medication Sig Start Date Bridges Date Taking? Authorizing Provider  acetaminophen (TYLENOL) 500 MG tablet Take 1,000 mg by mouth every 4 (four) hours as needed.   Yes [provider]  aspirin EC 81 MG EC tablet Take 1 tablet (81 mg total)  by mouth daily. 03/03/17  Yes Bridges, Bhavinkumar, PA  atorvastatin (LIPITOR) 80 MG tablet Take 1 tablet (80 mg total) by mouth daily at 6 PM. 03/02/17  Yes Bridges, Bhavinkumar, PA  isosorbide mononitrate (IMDUR) 30 MG 24 hr tablet Take 1 tablet (30 mg total) by mouth daily. 03/03/17  Yes Bridges, Bhavinkumar, PA  nitroGLYCERIN (NITROSTAT) 0.4 MG SL tablet Place 1 tablet (0.4 mg total) under the tongue every 5 (five) minutes x 3 doses as needed for chest pain. 03/02/17  Yes Bridges,  Bhavinkumar, PA  Omeprazole Magnesium (PRILOSEC OTC PO) Take 1 capsule by mouth daily.   Yes [provider]  spironolactone (ALDACTONE) 25 MG tablet Take 0.5 tablets (12.5 mg total) by mouth daily. 03/02/17 03/02/18 Yes Bridges, Bhavinkumar, PA  carvedilol (COREG) 6.25 MG tablet Take 1 tablet (6.25 mg total) by mouth 2 (two) times daily. 03/15/17   Bradley Barges, PA-C    Review of Systems    Fatigue.  One episode of chest pain.  All other systems reviewed and are otherwise negative except as noted above.  Physical Exam    VS:  BP 110/82 (BP Location: Left Arm, Patient Position: Sitting, Cuff Size: Normal)   Pulse 97   Ht 6\' 1"  (1.854 m)   Wt 231 lb 8 oz (105 kg)   BMI 30.54 kg/m  , BMI Body mass index is 30.54 kg/m.  Repeat blood pressure 140/95 GEN: Well nourished, well developed, in no acute distress.  HEENT: normal.  Neck: Supple, no JVD, carotid bruits, or masses. Cardiac: RRR, no murmurs, rubs, or gallops. No clubbing, cyanosis, edema.  Radials/DP/PT 2+ and equal bilaterally.  Right wrist catheterization site without bleeding, bruit, or hematoma.  Respiratory:  Respirations regular and unlabored, intermittent, faint expiratory wheezing.  GI: Soft, nontender, nondistended, BS + x 4. MS: no deformity or atrophy. Skin: warm and dry, no rash. Neuro:  Strength and sensation are intact. Psych: Normal affect.  Accessory Clinical Findings    ECG -regular sinus rhythm, 97, inferior infarct, anterolateral T wave inversion.  Assessment & Plan    1.  Non-STEMI, subsequent episode of care/CAD: Status post recent presentation with a 2-day history of chest pain and elevated troponin.  Catheterization revealed significant multivessel CAD with an occluded right coronary artery, which was felt to be the infarct vessel.  Echo showed an EF of 20-25%.  He was evaluated by CT surgery and felt to be a poor candidate in the setting of tobacco abuse and LV dysfunction.  He has been medically  managed and doing relatively well, though he has been fatigued.  He remains on aspirin, statin, beta-blocker, nitrate therapy.  He does not currently have insurance and is not interested in considering initiation of Plavix.  For the same reason, he will not be partaking in cardiac rehab.  2.  Ischemic cardiomyopathy/HFrEF: Euvolemic on exam.  Weight is down 4 pounds since discharge.  He reports compliance with medications.  He remains on beta-blocker and spironolactone.  ARB/ACE inhibitor was not added during hospitalization secondary to mild renal insufficiency.  Follow-up basic metabolic panel today.  Will consider adding low-dose losartan if creatinine stable.  We discussed the importance of daily weights, sodium restriction, medication compliance, and symptom reporting and he verbalizes understanding.   3.  Hypertensive heart disease: Initial blood pressure recorded today was 110/82.  Patient has his cuff with him in shows me the log where he has been running in the 150s-170s at home.  I repeated his blood  pressure and got 140/95.  We checked his home blood pressure cuff and got something very similar.  In that setting, I am going to increase his carvedilol to 6.25 mg twice daily.  As above, continue spironolactone and check basic metabolic panel today.  Considering adding ARB.  4.  Hyperlipidemia: LDL was 108.  Continue statin therapy with plan for follow-up lipids and LFTs in about 4 more weeks.  5.  Alcohol abuse: He has not had any drinks since hospitalization.  I congratulated him and encouraged him to remain off of alcohol.    6.  Tobacco abuse: He continues to roll his own cigarettes and smokes about 5 cigarettes a day.  Complete cessation advised.  7.  Disposition: Follow-up basic metabolic panel today.  Will consider adding ARB.  Follow-up in clinic in 3-4 weeks.   Bradley Ducking, NP 03/15/2017, 3:33 PM

## 2017-03-15 NOTE — Patient Instructions (Addendum)
Medication Instructions:  Your physician has recommended you make the following change in your medication:  1- INCREASE Carvedilol to 6.25 mg by mouth two times a day.  You may take (2) - 3.125 mg tablet twice a day until you run out of your current prescription. Once you pick up new prescription, you can take 1 tablet (6.25 mg) by mouth two times a day.   Labwork: Your physician recommends that you return for lab work in: TODAY (BMET).   Testing/Procedures: none  Follow-Up: Your physician recommends that you schedule a follow-up appointment in: 3-4 WEEKS WITH CHRIS BERGE OR DR END.  If you need a refill on your cardiac medications before your next appointment, please call your pharmacy.

## 2017-03-16 ENCOUNTER — Telehealth: Payer: Self-pay | Admitting: Internal Medicine

## 2017-03-16 ENCOUNTER — Other Ambulatory Visit: Payer: Self-pay | Admitting: *Deleted

## 2017-03-16 ENCOUNTER — Encounter: Payer: Self-pay | Admitting: *Deleted

## 2017-03-16 DIAGNOSIS — I214 Non-ST elevation (NSTEMI) myocardial infarction: Secondary | ICD-10-CM

## 2017-03-16 DIAGNOSIS — Z79899 Other long term (current) drug therapy: Secondary | ICD-10-CM

## 2017-03-16 LAB — BASIC METABOLIC PANEL
BUN / CREAT RATIO: 16 (ref 9–20)
BUN: 20 mg/dL (ref 6–24)
CALCIUM: 10.2 mg/dL (ref 8.7–10.2)
CHLORIDE: 101 mmol/L (ref 96–106)
CO2: 20 mmol/L (ref 20–29)
Creatinine, Ser: 1.29 mg/dL — ABNORMAL HIGH (ref 0.76–1.27)
GFR calc non Af Amer: 64 mL/min/{1.73_m2} (ref >59)
GFR, EST AFRICAN AMERICAN: 74 mL/min/{1.73_m2} (ref >59)
GLUCOSE: 134 mg/dL — AB (ref 65–99)
Potassium: 4.7 mmol/L (ref 3.5–5.2)
Sodium: 140 mmol/L (ref 134–144)

## 2017-03-16 MED ORDER — LOSARTAN POTASSIUM 25 MG PO TABS: 25.0000 mg | ORAL_TABLET | Freq: Every day | ORAL | 3 refills | Status: DC

## 2017-03-16 MED ORDER — LISINOPRIL 5 MG PO TABS: 5.0000 mg | ORAL_TABLET | Freq: Every day | ORAL | 3 refills | Status: DC

## 2017-03-16 NOTE — Telephone Encounter (Signed)
PT says the new medication he was prescribed (pt unsure of med name) is $30 and he would like to see if there is a less expensive brand  Please call to advise

## 2017-03-16 NOTE — Telephone Encounter (Signed)
Ok.  Let's go with lisinopril 5 mg daily then.  That is $4.  He will still need f/u BMET in a week.  Thanks.

## 2017-03-16 NOTE — Telephone Encounter (Signed)
Patient verbalized understanding of new prescription and was very appeciative. He will not get the Losartan but will pick up the Lisinopril and still plan on lab work next week.

## 2017-03-23 ENCOUNTER — Other Ambulatory Visit
Admission: RE | Admit: 2017-03-23 | Discharge: 2017-03-23 | Disposition: A | Discharge status: Home / Self Care | Payer: Medicaid Other | Source: Ambulatory Visit | Attending: Nurse Practitioner | Admitting: Nurse Practitioner

## 2017-03-23 DIAGNOSIS — I214 Non-ST elevation (NSTEMI) myocardial infarction: Secondary | ICD-10-CM | POA: Insufficient documentation

## 2017-03-23 DIAGNOSIS — Z79899 Other long term (current) drug therapy: Secondary | ICD-10-CM

## 2017-03-23 LAB — BASIC METABOLIC PANEL
ANION GAP: 6 (ref 5–15)
BUN: 16 mg/dL (ref 6–20)
CALCIUM: 9.7 mg/dL (ref 8.9–10.3)
CO2: 25 mmol/L (ref 22–32)
Chloride: 104 mmol/L (ref 101–111)
Creatinine, Ser: 1.15 mg/dL (ref 0.61–1.24)
Glucose, Bld: 129 mg/dL — ABNORMAL HIGH (ref 65–99)
POTASSIUM: 4.2 mmol/L (ref 3.5–5.1)
Sodium: 135 mmol/L (ref 135–145)

## 2017-04-06 ENCOUNTER — Emergency Department: Payer: Medicaid Other

## 2017-04-06 ENCOUNTER — Other Ambulatory Visit: Payer: Self-pay

## 2017-04-06 ENCOUNTER — Encounter: Payer: Self-pay | Admitting: Emergency Medicine

## 2017-04-06 ENCOUNTER — Inpatient Hospital Stay
Admission: EM | Admit: 2017-04-06 | Discharge: 2017-04-10 | DRG: 281 | Disposition: A | Discharge status: Home / Self Care | Payer: Medicaid Other | Attending: Internal Medicine | Admitting: Internal Medicine

## 2017-04-06 ENCOUNTER — Telehealth: Payer: Self-pay | Admitting: Internal Medicine

## 2017-04-06 DIAGNOSIS — E669 Obesity, unspecified: Secondary | ICD-10-CM | POA: Diagnosis present

## 2017-04-06 DIAGNOSIS — I110 Hypertensive heart disease with heart failure: Secondary | ICD-10-CM | POA: Diagnosis present

## 2017-04-06 DIAGNOSIS — K219 Gastro-esophageal reflux disease without esophagitis: Secondary | ICD-10-CM | POA: Diagnosis present

## 2017-04-06 DIAGNOSIS — M109 Gout, unspecified: Secondary | ICD-10-CM | POA: Diagnosis present

## 2017-04-06 DIAGNOSIS — Z8249 Family history of ischemic heart disease and other diseases of the circulatory system: Secondary | ICD-10-CM

## 2017-04-06 DIAGNOSIS — F17210 Nicotine dependence, cigarettes, uncomplicated: Secondary | ICD-10-CM | POA: Diagnosis present

## 2017-04-06 DIAGNOSIS — F1010 Alcohol abuse, uncomplicated: Secondary | ICD-10-CM | POA: Diagnosis present

## 2017-04-06 DIAGNOSIS — Z6830 Body mass index (BMI) 30.0-30.9, adult: Secondary | ICD-10-CM | POA: Diagnosis not present

## 2017-04-06 DIAGNOSIS — I252 Old myocardial infarction: Secondary | ICD-10-CM

## 2017-04-06 DIAGNOSIS — I214 Non-ST elevation (NSTEMI) myocardial infarction: Secondary | ICD-10-CM | POA: Diagnosis present

## 2017-04-06 DIAGNOSIS — Z7982 Long term (current) use of aspirin: Secondary | ICD-10-CM | POA: Diagnosis not present

## 2017-04-06 DIAGNOSIS — E785 Hyperlipidemia, unspecified: Secondary | ICD-10-CM | POA: Diagnosis present

## 2017-04-06 DIAGNOSIS — R079 Chest pain, unspecified: Secondary | ICD-10-CM | POA: Diagnosis present

## 2017-04-06 DIAGNOSIS — Z79899 Other long term (current) drug therapy: Secondary | ICD-10-CM | POA: Diagnosis not present

## 2017-04-06 DIAGNOSIS — Z880 Allergy status to penicillin: Secondary | ICD-10-CM

## 2017-04-06 DIAGNOSIS — I25110 Atherosclerotic heart disease of native coronary artery with unstable angina pectoris: Secondary | ICD-10-CM | POA: Diagnosis present

## 2017-04-06 DIAGNOSIS — I255 Ischemic cardiomyopathy: Secondary | ICD-10-CM | POA: Diagnosis present

## 2017-04-06 DIAGNOSIS — I5022 Chronic systolic (congestive) heart failure: Secondary | ICD-10-CM | POA: Diagnosis present

## 2017-04-06 DIAGNOSIS — F17200 Nicotine dependence, unspecified, uncomplicated: Secondary | ICD-10-CM

## 2017-04-06 LAB — BASIC METABOLIC PANEL
ANION GAP: 12 (ref 5–15)
BUN: 17 mg/dL (ref 6–20)
CHLORIDE: 101 mmol/L (ref 101–111)
CO2: 23 mmol/L (ref 22–32)
Calcium: 9.9 mg/dL (ref 8.9–10.3)
Creatinine, Ser: 1.05 mg/dL (ref 0.61–1.24)
GFR calc non Af Amer: >60 mL/min (ref >60)
Glucose, Bld: 123 mg/dL — ABNORMAL HIGH (ref 65–99)
POTASSIUM: 4.5 mmol/L (ref 3.5–5.1)
SODIUM: 136 mmol/L (ref 135–145)

## 2017-04-06 LAB — CBC
HEMATOCRIT: 41.2 % (ref 40.0–52.0)
Hemoglobin: 13.7 g/dL (ref 13.0–18.0)
MCH: 29.3 pg (ref 26.0–34.0)
MCHC: 33.3 g/dL (ref 32.0–36.0)
MCV: 87.9 fL (ref 80.0–100.0)
PLATELETS: 398 10*3/uL (ref 150–440)
RBC: 4.68 MIL/uL (ref 4.40–5.90)
RDW: 14.3 % (ref 11.5–14.5)
WBC: 9.9 10*3/uL (ref 3.8–10.6)

## 2017-04-06 LAB — HEPARIN LEVEL (UNFRACTIONATED): HEPARIN UNFRACTIONATED: 0.4 [IU]/mL (ref 0.30–0.70)

## 2017-04-06 LAB — PROTIME-INR
INR: 0.96
Prothrombin Time: 12.7 seconds (ref 11.4–15.2)

## 2017-04-06 LAB — TROPONIN I
TROPONIN I: 2.82 ng/mL — AB (ref <0.03)
Troponin I: 1.67 ng/mL (ref <0.03)

## 2017-04-06 LAB — APTT: aPTT: 32 seconds (ref 24–36)

## 2017-04-06 MED ORDER — LISINOPRIL 5 MG PO TABS
5.0000 mg | ORAL_TABLET | Freq: Every day | ORAL | Status: DC
  Administered 2017-04-07 – 2017-04-10 (×4): 5 mg via ORAL
  Filled 2017-04-06 (×4): qty 1

## 2017-04-06 MED ORDER — SPIRONOLACTONE 25 MG PO TABS
12.5000 mg | ORAL_TABLET | Freq: Every day | ORAL | Status: DC
  Administered 2017-04-07 – 2017-04-10 (×4): 12.5 mg via ORAL
  Filled 2017-04-06 (×4): qty 1

## 2017-04-06 MED ORDER — TRAMADOL HCL 50 MG PO TABS
50.0000 mg | ORAL_TABLET | Freq: Four times a day (QID) | ORAL | Status: DC | PRN
  Administered 2017-04-07: 50 mg via ORAL
  Filled 2017-04-06: qty 1

## 2017-04-06 MED ORDER — ONDANSETRON HCL 4 MG/2ML IJ SOLN: 4.0000 mg | Freq: Four times a day (QID) | INTRAMUSCULAR | Status: DC | PRN

## 2017-04-06 MED ORDER — ACETAMINOPHEN 325 MG PO TABS: 650.0000 mg | ORAL_TABLET | Freq: Four times a day (QID) | ORAL | Status: DC | PRN

## 2017-04-06 MED ORDER — HEPARIN BOLUS VIA INFUSION
4000.0000 [IU] | Freq: Once | INTRAVENOUS | Status: AC
  Administered 2017-04-06: 4000 [IU] via INTRAVENOUS
  Filled 2017-04-06: qty 4000

## 2017-04-06 MED ORDER — ASPIRIN EC 81 MG PO TBEC
81.0000 mg | DELAYED_RELEASE_TABLET | Freq: Every day | ORAL | Status: DC
  Administered 2017-04-07 – 2017-04-10 (×4): 81 mg via ORAL
  Filled 2017-04-06 (×4): qty 1

## 2017-04-06 MED ORDER — NITROGLYCERIN IN D5W 200-5 MCG/ML-% IV SOLN
0.0000 ug/min | Freq: Once | INTRAVENOUS | Status: DC
  Filled 2017-04-06: qty 250

## 2017-04-06 MED ORDER — HEPARIN (PORCINE) IN NACL 100-0.45 UNIT/ML-% IJ SOLN
1300.0000 [IU]/h | INTRAMUSCULAR | Status: DC
  Administered 2017-04-06 – 2017-04-07 (×2): 1300 [IU]/h via INTRAVENOUS
  Filled 2017-04-06 (×3): qty 250

## 2017-04-06 MED ORDER — NITROGLYCERIN IN D5W 200-5 MCG/ML-% IV SOLN
0.0000 ug/min | INTRAVENOUS | Status: DC
  Administered 2017-04-06 – 2017-04-08 (×4): 50 ug/min via INTRAVENOUS
  Filled 2017-04-06 (×4): qty 250

## 2017-04-06 MED ORDER — ONDANSETRON HCL 4 MG PO TABS
4.0000 mg | ORAL_TABLET | Freq: Four times a day (QID) | ORAL | Status: DC | PRN
Start: 2017-04-06 — End: 2017-04-10

## 2017-04-06 MED ORDER — NITROGLYCERIN IN D5W 200-5 MCG/ML-% IV SOLN
0.0000 ug/min | Freq: Once | INTRAVENOUS | Status: AC
  Administered 2017-04-06: 5 ug/min via INTRAVENOUS
  Filled 2017-04-06: qty 250

## 2017-04-06 MED ORDER — ACETAMINOPHEN 650 MG RE SUPP: 650.0000 mg | Freq: Four times a day (QID) | RECTAL | Status: DC | PRN

## 2017-04-06 MED ORDER — PANTOPRAZOLE SODIUM 40 MG PO TBEC
40.0000 mg | DELAYED_RELEASE_TABLET | Freq: Every day | ORAL | Status: DC
  Administered 2017-04-07 – 2017-04-10 (×4): 40 mg via ORAL
  Filled 2017-04-06 (×4): qty 1

## 2017-04-06 MED ORDER — MORPHINE SULFATE (PF) 2 MG/ML IV SOLN: 1.0000 mg | INTRAVENOUS | Status: DC | PRN

## 2017-04-06 MED ORDER — NITROGLYCERIN IN D5W 200-5 MCG/ML-% IV SOLN
0.0000 ug/min | Freq: Once | INTRAVENOUS | Status: AC
  Administered 2017-04-06: 50 ug/min via INTRAVENOUS
  Filled 2017-04-06: qty 250

## 2017-04-06 MED ORDER — ATORVASTATIN CALCIUM 20 MG PO TABS
80.0000 mg | ORAL_TABLET | Freq: Every day | ORAL | Status: DC
  Administered 2017-04-06 – 2017-04-09 (×4): 80 mg via ORAL
  Filled 2017-04-06 (×4): qty 4

## 2017-04-06 MED ORDER — CARVEDILOL 6.25 MG PO TABS
6.2500 mg | ORAL_TABLET | Freq: Two times a day (BID) | ORAL | Status: DC
  Administered 2017-04-06 – 2017-04-09 (×7): 6.25 mg via ORAL
  Filled 2017-04-06 (×7): qty 1

## 2017-04-06 MED ORDER — CLOPIDOGREL BISULFATE 75 MG PO TABS
75.0000 mg | ORAL_TABLET | Freq: Every day | ORAL | Status: DC
  Administered 2017-04-06 – 2017-04-10 (×5): 75 mg via ORAL
  Filled 2017-04-06 (×5): qty 1

## 2017-04-06 MED ORDER — ASPIRIN 81 MG PO CHEW
324.0000 mg | CHEWABLE_TABLET | Freq: Once | ORAL | Status: AC
  Administered 2017-04-06: 324 mg via ORAL
  Filled 2017-04-06: qty 4

## 2017-04-06 NOTE — Consult Note (Signed)
Cardiology Consult    Patient ID: Bradley Bridges MRN: [id number], DOB/AGE: 29-Apr-1966   Admit date: 04/06/2017 Date of Consult: 04/06/2017  Primary Physician: Patient, [name] Per Primary Cardiologist: Wallace Cullens. End, MD  Requesting Provider: Kathie RhodesS. Allena KatzPatel, MD  Patient Profile    Bradley Bridges is a 51 y.o. male with a history of alcohol abuse, tobacco abuse, hypertension, obesity, CAD, ischemic cardiomyopathy, and recent admission with late presenting STEMI/non-STEMI with finding of multivessel coronary artery disease-being medically managed as he is a poor surgical candidate- who is being seen today for the evaluation of non-STEMI and chest pain at the request of Dr. Allena KatzPatel.  Past Medical History   Past Medical History:  Diagnosis Date  . Chest pain 02/2017  . Coronary artery disease    a. 02/2017 NSTEMI/Cath: LM 25, LAD 25ost, 70p, 8223m, 70/90d, D1/2 mod dzs, RI 80, LCX 8284m, OM1 small, OM2 mod dzs, RCA 100p, RPDA fills via L->R collats.  Marland Kitchen. ETOH abuse   . GERD (gastroesophageal reflux disease)    a. x 20 yrs  . Gout   . HFrEF (heart failure with reduced ejection fraction) (HCC)    a. 02/2017 Echo: EF 20-25%, diff HK, mild MR, mildly dil RA, mild TR.  Marland Kitchen. Hypertension    a. x 20 yrs - no meds since 2017.  . Ischemic cardiomyopathy    a. 02/2017 Echo: EF 20-25%, diff HK.  . NSTEMI (non-ST elevated myocardial infarction) (HCC)    a. 02/2017--sev 3VD-->turned down for CABG-->Med Rx.  . Obesity   . Tobacco abuse     Past Surgical History:  Procedure Laterality Date  . LEFT HEART CATH AND CORONARY ANGIOGRAPHY N/A 02/27/2017   Performed by Yvonne KendallEnd, Shiesha Jahn, MD at [facility] INVASIVE CV LAB     Allergies  Allergies  Allergen Reactions  . Penicillins Rash    Has patient had a PCN reaction causing immediate rash, facial/tongue/throat swelling, SOB or lightheadedness with hypotension: Yes Has patient had a PCN reaction causing severe rash involving mucus membranes or skin necrosis:  No Has patient had a PCN reaction that required hospitalization: No Has patient had a PCN reaction occurring within the last 10 years: No If all of the above answers are "NO", then may proceed with Cephalosporin use.    History of Present Illness    51 y/o ? with the above complex past medical history including hypertension, obesity, tobacco abuse, and alcohol abuse.  In early October he presented to [facility] with chest and abdominal discomfort radiating to both of his arms.  He was noted to have ST segment changes concerning for late presenting STEMI.  Troponin was elevated.  Catheterization revealed significant multivessel disease as outlined in the past medical history.  [facility] was [facility] and seen by CT surgery but felt to be a poor candidate for surgery secondary to tobacco abuse and LV dysfunction-EF 20-25%.  He was subsequently placed on aspirin, statin, beta-blocker, nitrate, and Spironolactone therapy.  He was seen in clinic on October 25, at which time he was doing reasonably well.  Labs were stable and he was placed on low-dose lisinopril.  He was in his usual state of health until November 14, when he had an episode of chest pain that required 3 sublingual nitroglycerin tablets.  This lasted over an hour.  He had recurrent chest pain on the evening of November 15, that did not resolve with nitroglycerin.  Chest pain persisted throughout the night and as a result, he  came into the emergency department earlier this morning.  Here, ECG was nonacute.  He was placed on heparin and nitroglycerin via IV with resolution of chest pain.  Troponin is elevated at 1.67.  We have been asked to evaluate.  He is currently chest pain-free.  Inpatient Medications    . [START ON 04/07/2017] aspirin EC  81 mg Oral Daily  . atorvastatin  80 mg Oral q1800  . carvedilol  6.25 mg Oral BID  . [START ON 04/07/2017] lisinopril  5 mg Oral Daily  . [START ON 04/07/2017] pantoprazole  40 mg Oral  Daily  . [START ON 04/07/2017] spironolactone  12.5 mg Oral Daily    Family History    Family History  Problem Relation Age of Onset  . Rheum arthritis Mother   . CAD Father        First MI in his 5040's.  Died in his 7050's  . Other Father        liver failure  . Diabetes Sister   . CAD Brother        s/p stenting    Social History    Social History   Socioeconomic History  . Marital status: Single    Spouse name: Not on file  . Number of children: Not on file  . Years of education: Not on file  . Highest education level: Not on file  Social Needs  . Financial resource strain: Not on file  . Food insecurity - worry: Not on file  . Food insecurity - inability: Not on file  . Transportation needs - medical: Not on file  . Transportation needs - non-medical: Not on file  Occupational History  . Not on file  Tobacco Use  . Smoking status: Current Every Day Smoker    Packs/day: 0.50    Years: 30.00    Pack years: 15.00    Types: Cigarettes  . Smokeless tobacco: Never Used  Substance and Sexual Activity  . Alcohol use: Yes    Comment: 1/5th of Vodka every weekend- states he quit 03/15/17  . 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 Sexual activity: Not on file  Other Topics Concern  . Not on file  Social History Narrative   Independent at baseline.  [place]  Does not routinely exercise.     Review of Systems    General:  No chills, fever, night sweats or weight changes.  Cardiovascular:  +++ chest pain, +++ dyspnea on exertion, no edema, orthopnea, palpitations, paroxysmal nocturnal dyspnea. Dermatological: No rash, lesions/masses Respiratory: No cough, dyspnea Urologic: No hematuria, dysuria Abdominal:   No nausea, vomiting, diarrhea, bright red blood per rectum, melena, or hematemesis Neurologic:  No visual changes, wkns, changes in mental status. All other systems reviewed and are  otherwise negative except as noted above.  Physical Exam    Blood pressure 119/77, pulse 74, temperature 97.6 F (36.4 C), temperature source Oral, resp. rate 18, height 6\' 1"  (1.854 m), weight 233 lb (105.7 kg), SpO2 98 %.  General: Pleasant, NAD Psych: Normal affect. Neuro: Alert and oriented X 3. Moves all extremities spontaneously. HEENT: Normal  Neck: Supple without bruits or JVD. Lungs:  Resp regular and unlabored, CTA. Heart: RRR no s3, s4, or murmurs. Abdomen: Soft, non-tender, non-distended, BS + x 4.  Extremities: No clubbing, cyanosis or edema. DP/PT/Radials 2+ and equal bilaterally.  Labs  Recent Labs    04/06/17 1245  TROPONINI 1.67*   Lab Results  Component Value Date   WBC 9.9 04/06/2017   HGB 13.7 04/06/2017   HCT 41.2 04/06/2017   MCV 87.9 04/06/2017   PLT 398 04/06/2017    Recent Labs  Lab 04/06/17 1245  NA 136  K 4.5  CL 101  CO2 23  BUN 17  CREATININE 1.05  CALCIUM 9.9  GLUCOSE 123*   Lab Results  Component Value Date   CHOL 196 02/26/2017   HDL 44 02/26/2017   LDLCALC 108 (H) 02/26/2017   TRIG 218 (H) 02/26/2017   Lab Results  Component Value Date   DDIMER  07/27/2007    <0.22        AT THE INHOUSE ESTABLISHED CUTOFF VALUE OF 0.48 ug/mL FEU, THIS ASSAY HAS BEEN DOCUMENTED IN THE LITERATURE TO HAVE     Radiology Studies    Dg Chest 2 View  Result Date: 04/06/2017 CLINICAL DATA:  Chest pain. EXAM: CHEST  2 VIEW COMPARISON:  02/26/2017 . FINDINGS: Mediastinum and hilar structures normal. Cardiomegaly with normal pulmonary vascularity. No focal infiltrate. No pleural effusion or pneumothorax. IMPRESSION: Mild cardiomegaly with normal pulmonary vascularity. No focal infiltrate . Electronically Signed   By: Maisie Fus  Register   On: 04/06/2017 13:06    ECG & Cardiac Imaging    Regular sinus rhythm, 88, left axis deviation, left atrial enlargement, inferolateral and anterior T wave inversion.  Possible old inferior infarct.  No  acute changes.  Assessment & Plan    1.  Non-STEMI/CAD: Status post late presenting STEMI in early October with catheterization revealing severe multivessel coronary artery disease.  Medical therapy was recommended in the setting of LV dysfunction and poor surgical candidacy.  He was doing well when seen in clinic October 25 however, he began to experience intermittent chest discomfort which was initially nitrate responsive on November 14.  Chest pain worsened on the night prior to admission and did not respond to nitrates.  Troponin elevated at 1.67.  He is currently chest pain-free on heparin and nitrates.  Continue this therapy over the weekend along with his home doses of aspirin, statin, beta-blocker, ACE inhibitor.  In the setting of recurrent non-STEMI, add Plavix as he is not a surgical candidate.  He was previously reluctant to start this secondary to cost   But now willing to consider.  Interventional team to review films from October to determine whether or not there are any potential targets for intervention.  2.  Ischemic cardiomyopathy/HFrEF: EF 20-25%.  Euvolemic on exam.  He reports compliance with beta-blocker, ACE inhibitor, and Spironolactone.  Plan follow-up echo as outpatient to determine candidacy for ICD in the future.  3.  Hypertensive heart disease: Stable.  Continue beta-blocker, ACE inhibitor, and spironolactone.  4.  Hyperlipidemia: Continue statin therapy.  LDL was 108 in October.  Follow-up fasting lipids while he is here.  5.  Alcohol abuse: He stopped drinking in October.  I encouraged him to remain off of alcohol.  6.  Tobacco abuse: Still smoking a few cigarettes a day.  He rolls his own.  Complete cessation advised.  Signed, Nicolasa Ducking, NP 04/06/2017, 5:32 PM  For questions or updates, please contact   Please consult [email or web address] for contact info under Cardiology/STEMI.

## 2017-04-06 NOTE — Telephone Encounter (Signed)
Pt calling stating last 3 nights he's been having chest pains.  He's taken 3 nitro the first night Second night took two Last night he states he's had the pain all night  And is still having the pains  Please advise.

## 2017-04-06 NOTE — Progress Notes (Signed)
ANTICOAGULATION CONSULT NOTE - Initial Consult  Pharmacy Consult for Heparin Drip Indication: chest pain/ACS  Allergies  Allergen Reactions  . Penicillins Rash    Has patient had a PCN reaction causing immediate rash, facial/tongue/throat swelling, SOB or lightheadedness with hypotension: Yes Has patient had a PCN reaction causing severe rash involving mucus membranes or skin necrosis: No Has patient had a PCN reaction that required hospitalization: No Has patient had a PCN reaction occurring within the last 10 years: No If all of the above answers are "NO", then may proceed with Cephalosporin use.    Patient Measurements: Height: 6\' 1"  (185.4 cm) Weight: 233 lb (105.7 kg) IBW/kg (Calculated) : 79.9 Heparin Dosing Weight: 101.6 kg  Vital Signs: Temp: 97.8 F (36.6 C) (11/16 1246) Temp Source: Oral (11/16 1246) BP: 184/110 (11/16 1338) Pulse Rate: 84 (11/16 1338)  Labs: Recent Labs    04/06/17 1245  HGB 13.7  HCT 41.2  PLT 398  CREATININE 1.05  TROPONINI 1.67*    Estimated Creatinine Clearance: 107.4 mL/min (by C-G formula based on SCr of 1.05 mg/dL).   Medical History: Past Medical History:  Diagnosis Date  . Chest pain 02/2017  . Coronary artery disease    a. 02/2017 NSTEMI/Cath: LM 25, LAD 25ost, 70p, 5566m, 70/90d, D1/2 mod dzs, RI 80, LCX 7448m, OM1 small, OM2 mod dzs, RCA 100p, RPDA fills via L->R collats.  Marland Kitchen. ETOH abuse   . GERD (gastroesophageal reflux disease)    a. x 20 yrs  . Gout   . HFrEF (heart failure with reduced ejection fraction) (HCC)    a. 02/2017 Echo: EF 20-25%, diff HK, mild MR, mildly dil RA, mild TR.  Marland Kitchen. Hypertension    a. x 20 yrs - no meds since 2017.  . Ischemic cardiomyopathy    a. 02/2017 Echo: EF 20-25%, diff HK.  . NSTEMI (non-ST elevated myocardial infarction) (HCC)    a. 02/2017--sev 3VD-->turned down for CABG-->Med Rx.  . Obesity   . Tobacco abuse     Medications:  Scheduled:  . heparin  4,000 Units Intravenous Once    Infusions:  . heparin      Assessment: Pharmacy consulted to dose a Heparin drip for this 51 yo male with chest pain x 3 nights ago.   Goal of Therapy:  Heparin level 0.3-0.7 units/ml Monitor platelets by anticoagulation protocol: Yes   Plan:  Give 4000 units bolus x 1 Start heparin infusion at 1300 units/hr Check anti-Xa level in 6 hours and daily while on heparin Continue to monitor H&H and platelets  Yecenia Dalgleish K, RPH 04/06/2017,2:01 PM

## 2017-04-06 NOTE — Progress Notes (Signed)
CRITICAL VALUE ALERT  Critical Value:  Troponin 2.82  Date & Time Notied:  04/06/2017 18:28  Provider Notified: Mariah MillingGollan  Orders Received/Actions taken: continue to monitor, patient currently on heparin/ nitroglycerin gtt, orders to continue nitro at 50mcg until morning. Patiently currently chest pain free, will continue to assess.

## 2017-04-06 NOTE — ED Notes (Signed)
Dr. Patel at bedside 

## 2017-04-06 NOTE — ED Triage Notes (Signed)
Pt comes into the ED via POV c/o chest pain on the right side of his chest.  States it feels like heart burn.  Patient had recent MI about a month ago.  Patient took a nitroglycerin yesterday when the pain started and it helped.  Then th pain returned this morning with no relief.  Patient in NAD at this time with even and unlabored respirations.  Denies any radiating pain, N/V, dizziness, diaphoresis, or shortness of breath.  Patient states that the first night of chest pain 3 days ago, he did feel dizzy.  Patient ambulatory to triage at this time.

## 2017-04-06 NOTE — ED Notes (Signed)
Blue top sent to lab. Drawn when PIV to L AC started.

## 2017-04-06 NOTE — ED Notes (Addendum)
CP that began  X 3 nights ago. Pt taking nitro at home for relief for past 2 nights. States pain would go away shortly after taking the sublingual nitro X1. Reporting pain that started this AM to right and center of chest. Describes pain as discomfort. Denies SOB, nausea, diaphoresis, weakness, dizziness. Hx of MI. Pt alert and oriented X4, active, cooperative, pt in NAD. RR even and unlabored, color WNL. Talks in full and complete sentences. Pt denies use of blood thinners. Takes 81mg  of ASA PO daily.

## 2017-04-06 NOTE — ED Notes (Signed)
Heparin verified at bedside with Zollie Scalelivia, RN.

## 2017-04-06 NOTE — Telephone Encounter (Signed)
S/w pt who reports chest discomfort for three days.  The first night it started as discomfort and progressed to 10/10 chest pain, burping, and dizzines. BP 130s/80s.  He took 3 nitroglycerins and chest pain resolved. Second night he felt discomfort, no nitro. Sx resolved on own. Last night, chest pain and indigestion worsened from what he experienced on night two.  He took 3 nitroglycerins; sx "chilled out but they were still there". Chest discomfort/pain and indigestion continuous this morning. He has not taken nitro. Took extra prilosec with no relief. Denies jaw or left arm pain, dizziness, nausea, vomiting, or diaphoresis. NSTEMI 10/9; transferred to [facility] after cardiac cath for surgical evaluation. Medical management recommended and pt discharged home.  Reviewed medications; pt confirms he is taking as prescribed.  Given pt's recent cardiac history, and current sx,  I recommended calling 911. Pt lives alone with no one there at this time. Pt reports he has called sister who could bring him to our office. Explained w/active chest discomfort, he should not come into the office. He is reluctant to call 911 because "they poke me with needles and stuff".  Encouraged him to call them now or proceed to ED with sister as she has offered to transport him.  Pt states he will call her now.  He had no further questions at this time.

## 2017-04-06 NOTE — ED Provider Notes (Signed)
[facility] Emergency Department Provider Note  ____________________________________________  Time seen: Approximately 1:43 PM  I have reviewed the triage vital signs and the nursing notes.   HISTORY  Chief Complaint Chest Pain   HPI Bradley Bridges is a 51 y.o. male with h/o 3 vessel CAD on medical therapy s/p STEMI one month ago, smoking, CHF, HTN who presents for evaluation of chest pain. Patient reports that he has had daily episodes of chest pain for the last 3 days. The first one 3 days ago was associated with diaphoresis and nausea. Patient took a nitroglycerin and the pain went away. Yesterday evening he had his third episode of pain and he took 3 sublingual nitros and a full dose of aspirin with no significant improvement of the pain. At this time he reports that his pain is very mild 1/10, he describes the quality as pressure, located in the right side of his chest, constant and nonradiating. He denies diaphoresis, nausea, vomiting, dizziness, shortness of breath. Of note patient was seen here a month ago for a STEMI. He was taken to the catheter lab and found to have a severe three-vessel CAD. [facility]. Patient was not a good surgical candidate and therefore was sent home on medical management only.  Past Medical History:  Diagnosis Date  . Chest pain 02/2017  . Coronary artery disease    a. 02/2017 NSTEMI/Cath: LM 25, LAD 25ost, 70p, 5979m, 70/90d, D1/2 mod dzs, RI 80, LCX 5936m, OM1 small, OM2 mod dzs, RCA 100p, RPDA fills via L->R collats.  Marland Kitchen. ETOH abuse   . GERD (gastroesophageal reflux disease)    a. x 20 yrs  . Gout   . HFrEF (heart failure with reduced ejection fraction) (HCC)    a. 02/2017 Echo: EF 20-25%, diff HK, mild MR, mildly dil RA, mild TR.  Marland Kitchen. Hypertension    a. x 20 yrs - no meds since 2017.  . Ischemic cardiomyopathy    a. 02/2017 Echo: EF 20-25%, diff HK.  . NSTEMI (non-ST elevated  myocardial infarction) (HCC)    a. 02/2017--sev 3VD-->turned down for CABG-->Med Rx.  . Obesity   . Tobacco abuse     Patient Active Problem List   Diagnosis Date Noted  . STEMI (ST elevation myocardial infarction) (HCC) 03/02/2017  . NSTEMI (non-ST elevated myocardial infarction) (HCC) 02/26/2017    Past Surgical History:  Procedure Laterality Date  . LEFT HEART CATH AND CORONARY ANGIOGRAPHY N/A 02/27/2017   Performed by Yvonne KendallEnd, Christopher, MD at [facility] INVASIVE CV LAB    Prior to Admission medications   Medication Sig Start Date End Date Taking? Authorizing Provider  atorvastatin (LIPITOR) 80 MG tablet Take 1 tablet (80 mg total) by mouth daily at 6 PM. 03/02/17  Yes Bhagat, Bhavinkumar, PA  isosorbide mononitrate (IMDUR) 30 MG 24 hr tablet Take 1 tablet (30 mg total) by mouth daily. 03/03/17  Yes Bhagat, Bhavinkumar, PA  lisinopril (PRINIVIL,ZESTRIL) 5 MG tablet Take 1 tablet (5 mg total) by mouth daily. 03/16/17 06/14/17 Yes Ok AnisBerge, Christopher R, NP  nitroGLYCERIN (NITROSTAT) 0.4 MG SL tablet Place 1 tablet (0.4 mg total) under the tongue every 5 (five) minutes x 3 doses as needed for chest pain. 03/02/17  Yes Bhagat, Bhavinkumar, PA  spironolactone (ALDACTONE) 25 MG tablet Take 0.5 tablets (12.5 mg total) by mouth daily. 03/02/17 03/02/18 Yes Bhagat, Bhavinkumar, PA  acetaminophen (TYLENOL) 500 MG tablet Take 1,000 mg by mouth every 4 (  four) hours as needed.    [provider]  aspirin EC 81 MG EC tablet Take 1 tablet (81 mg total) by mouth daily. 03/03/17   Bhagat, Sharrell Ku, PA  carvedilol (COREG) 3.125 MG tablet Take 3.125 mg 2 (two) times daily by mouth. 03/02/17   [provider]  carvedilol (COREG) 6.25 MG tablet Take 1 tablet (6.25 mg total) by mouth 2 (two) times daily. 03/15/17   Sondra Barges, PA-C  Omeprazole Magnesium (PRILOSEC OTC PO) Take 1 capsule by mouth daily.    [provider]    Allergies Penicillins  Family History  Problem Relation  Age of Onset  . Rheum arthritis Mother   . CAD Father        First MI in his 53's.  Died in his 75's  . Other Father        liver failure  . Diabetes Sister   . CAD Brother        s/p stenting    Social History Social History   Tobacco Use  . Smoking status: Current Every Day Smoker    Packs/day: 0.50    Years: 30.00    Pack years: 15.00    Types: Cigarettes  . Smokeless tobacco: Never Used  Substance Use Topics  . Alcohol use: Yes    Comment: 1/5th of Vodka every weekend- states he quit 03/15/17  . 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 Eyes: Negative for visual changes. ENT: Negative for sore throat. Neck: No neck pain  Cardiovascular: + chest pain. Respiratory: Negative for shortness of breath. Gastrointestinal: Negative for abdominal pain, vomiting or diarrhea. Genitourinary: Negative for dysuria. Musculoskeletal: Negative for back pain. Skin: Negative for rash. Neurological: Negative for headaches, weakness or numbness. Psych: No SI or HI  ____________________________________________   PHYSICAL EXAM:  VITAL SIGNS: ED Triage Vitals  Enc Vitals Group     BP 04/06/17 1246 128/80     Pulse Rate 04/06/17 1246 85     Resp 04/06/17 1246 17     Temp 04/06/17 1246 97.8 F (36.6 C)     Temp Source 04/06/17 1246 Oral     SpO2 04/06/17 1246 98 %     Weight 04/06/17 1245 233 lb (105.7 kg)     Height 04/06/17 1245 6\' 1"  (1.854 m)     Head Circumference --      Peak Flow --      Pain Score 04/06/17 1245 1     Pain Loc --      Pain Edu? --      Excl. in GC? --     Constitutional: Alert and oriented. Well appearing and in no apparent distress. HEENT:      Head: Normocephalic and atraumatic.         Eyes: Conjunctivae are normal. Sclera is non-icteric.       Mouth/Throat: Mucous membranes are moist.       Neck: Supple with no signs of  meningismus. Cardiovascular: Regular rate and rhythm. No murmurs, gallops, or rubs. 2+ symmetrical distal pulses are present in all extremities. No JVD. Respiratory: Normal respiratory effort. Lungs are clear to auscultation bilaterally. No wheezes, crackles, or rhonchi.  Gastrointestinal: Soft, non tender, and non distended with positive bowel sounds. No rebound or guarding. Musculoskeletal: Nontender with normal range of motion in all extremities. No edema, cyanosis, or erythema of  extremities. Neurologic: Normal speech and language. Face is symmetric. Moving all extremities. No gross focal neurologic deficits are appreciated. Skin: Skin is warm, dry and intact. No rash noted. Psychiatric: Mood and affect are normal. Speech and behavior are normal.  ____________________________________________   LABS (all labs ordered are listed, but only abnormal results are displayed)  Labs Reviewed  BASIC METABOLIC PANEL - Abnormal; Notable for the following components:      Result Value   Glucose, Bld 123 (*)    All other components within normal limits  TROPONIN I - Abnormal; Notable for the following components:   Troponin I 1.67 (*)    All other components within normal limits  CBC   ____________________________________________  EKG  ED ECG REPORT I, Nita Sicklearolina Graeden Bitner, the attending physician, personally viewed and interpreted this ECG.  Normal sinus rhythm, rate of 88, normal intervals, normal axis, deep T-wave inversions in inferior and lateral leads which are worse when compared to prior from a month ago. No ST elevation.  ____________________________________________  RADIOLOGY  CXR: Mild cardiomegaly with normal pulmonary vascularity. No focal infiltrate .  ____________________________________________   PROCEDURES  Procedure(s) performed: None Procedures Critical Care performed: yes  CRITICAL CARE Performed by: Nita Sicklearolina Soren Lazarz  ?  Total critical care time: 35  min  Critical care time was exclusive of separately billable procedures and treating other patients.  Critical care was necessary to treat or prevent imminent or life-threatening deterioration.  Critical care was time spent personally by me on the following activities: development of treatment plan with patient and/or surrogate as well as nursing, discussions with consultants, evaluation of patient's response to treatment, examination of patient, obtaining history from patient or surrogate, ordering and performing treatments and interventions, ordering and review of laboratory studies, ordering and review of radiographic studies, pulse oximetry and re-evaluation of patient's condition.  ____________________________________________   INITIAL IMPRESSION / ASSESSMENT AND PLAN / ED COURSE  51 y.o. male with h/o 3 vessel CAD on medical therapy s/p STEMI one month ago, smoking, CHF, HTN who presents for evaluation of chest pain. Patient currently has 1 out of 10 pain. EKG showing worsening T-wave inversions in inferior lateral leads with no evidence of STEMI. First troponin is elevated at 1.5 concerning for NSTEMI. Will give a full dose of aspirin, start patient on heparin and nitro drip and admit to cardiology.      As part of my medical decision making, I reviewed the following data within the electronic MEDICAL RECORD NUMBER Nursing notes reviewed and incorporated, Labs reviewed , EKG interpreted , Old EKG reviewed, Old chart reviewed, Radiograph reviewed , A consult was requested and obtained from this/these consultant(s) Cardiology, Notes from prior ED visits and [place] Controlled Substance Database    Pertinent labs & imaging results that were available during my care of the patient were reviewed by me and considered in my medical decision making (see chart for details).    ____________________________________________   FINAL CLINICAL IMPRESSION(S) / ED DIAGNOSES  Final diagnoses:  NSTEMI  (non-ST elevated myocardial infarction) (HCC)      NEW MEDICATIONS STARTED DURING THIS VISIT:  This SmartLink is deprecated. Use AVSMEDLIST instead to display the medication list for a patient.   Note:  This document was prepared using Dragon voice recognition software and may include unintentional dictation errors.    Nita SickleVeronese, [place], MD 04/06/17 1350

## 2017-04-06 NOTE — H&P (Signed)
[facility] [place] [facility]   PATIENT NAME: Bradley Bridges    MR#:  [id number]  DATE OF BIRTH:  1966/03/31  DATE OF ADMISSION:  04/06/2017  PRIMARY CARE PHYSICIAN: Patient, [name] Per   REQUESTING/REFERRING PHYSICIAN: Dr. Don PerkingVeronese  CHIEF COMPLAINT:   Chest pain on and off for 3 days HISTORY OF PRESENT ILLNESS:  Bradley Pinkodd Sobh  is a 51 y.o. male with a known history of coronary artery disease with three-vessel severe CAD on cardiac cath done October 2018, ischemic cardiomyopathy with EF of 20-25%, hypertension, GERD, tobacco abuse comes to the emergency room with on and off chest pain for last 3 days.  Patient initially thought it was indigestion did not wear off came to the emergency room found to have troponin of 1.67.  He started on heparin drip along with nitroglycerin drip.  Patient rates his chest pain 2 out of 10.  He is being admitted with acute non-STEMI.  PAST MEDICAL HISTORY:   Past Medical History:  Diagnosis Date  . Chest pain 02/2017  . Coronary artery disease    a. 02/2017 NSTEMI/Cath: LM 25, LAD 25ost, 70p, 364m, 70/90d, D1/2 mod dzs, RI 80, LCX 3619m, OM1 small, OM2 mod dzs, RCA 100p, RPDA fills via L->R collats.  Marland Kitchen. ETOH abuse   . GERD (gastroesophageal reflux disease)    a. x 20 yrs  . Gout   . HFrEF (heart failure with reduced ejection fraction) (HCC)    a. 02/2017 Echo: EF 20-25%, diff HK, mild MR, mildly dil RA, mild TR.  Marland Kitchen. Hypertension    a. x 20 yrs - no meds since 2017.  . Ischemic cardiomyopathy    a. 02/2017 Echo: EF 20-25%, diff HK.  . NSTEMI (non-ST elevated myocardial infarction) (HCC)    a. 02/2017--sev 3VD-->turned down for CABG-->Med Rx.  . Obesity   . Tobacco abuse     PAST SURGICAL HISTOIRY:   Past Surgical History:  Procedure Laterality Date  . LEFT HEART CATH AND CORONARY ANGIOGRAPHY N/A 02/27/2017   Performed by Yvonne KendallEnd, Christopher, MD at [facility] INVASIVE CV LAB    SOCIAL HISTORY:   Social History   Tobacco  Use  . Smoking status: Current Every Day Smoker    Packs/day: 0.50    Years: 30.00    Pack years: 15.00    Types: Cigarettes  . Smokeless tobacco: Never Used  Substance Use Topics  . Alcohol use: Yes    Comment: 1/5th of Vodka every weekend- states he quit 03/15/17    FAMILY HISTORY:   Family History  Problem Relation Age of Onset  . Rheum arthritis Mother   . CAD Father        First MI in his 940's.  Died in his 7850's  . Other Father        liver failure  . Diabetes Sister   . CAD Brother        s/p stenting    DRUG ALLERGIES:   Allergies  Allergen Reactions  . Penicillins Rash    Has patient had a PCN reaction causing immediate rash, facial/tongue/throat swelling, SOB or lightheadedness with hypotension: Yes Has patient had a PCN reaction causing severe rash involving mucus membranes or skin necrosis: No Has patient had a PCN reaction that required hospitalization: No Has patient had a PCN reaction occurring within the last 10 years: No If all of the above answers are "NO", then may proceed with Cephalosporin use.    REVIEW OF SYSTEMS:  Review of Systems  Constitutional: Negative for chills, fever and weight loss.  HENT: Negative for ear discharge, ear pain and nosebleeds.   Eyes: Negative for blurred vision, pain and discharge.  Respiratory: Negative for sputum production, shortness of breath, wheezing and stridor.   Cardiovascular: Positive for chest pain. Negative for palpitations, orthopnea and PND.  Gastrointestinal: Negative for abdominal pain, diarrhea, nausea and vomiting.  Genitourinary: Negative for frequency and urgency.  Musculoskeletal: Negative for back pain and joint pain.  Neurological: Positive for weakness. Negative for sensory change, speech change and focal weakness.  Psychiatric/Behavioral: Negative for depression and hallucinations. The patient is not nervous/anxious.      MEDICATIONS AT HOME:   Prior to Admission medications   Medication  Sig Start Date End Date Taking? Authorizing Provider  acetaminophen (TYLENOL) 500 MG tablet Take 1,000 mg by mouth every 4 (four) hours as needed.   Yes [provider]  aspirin EC 81 MG EC tablet Take 1 tablet (81 mg total) by mouth daily. 03/03/17  Yes Bhagat, Bhavinkumar, PA  atorvastatin (LIPITOR) 80 MG tablet Take 1 tablet (80 mg total) by mouth daily at 6 PM. 03/02/17  Yes Bhagat, Bhavinkumar, PA  carvedilol (COREG) 6.25 MG tablet Take 1 tablet (6.25 mg total) by mouth 2 (two) times daily. 03/15/17  Yes Dunn, Raymon Muttonyan M, PA-C  isosorbide mononitrate (IMDUR) 30 MG 24 hr tablet Take 1 tablet (30 mg total) by mouth daily. 03/03/17  Yes Bhagat, Bhavinkumar, PA  lisinopril (PRINIVIL,ZESTRIL) 5 MG tablet Take 1 tablet (5 mg total) by mouth daily. 03/16/17 06/14/17 Yes Ok AnisBerge, Christopher R, NP  nitroGLYCERIN (NITROSTAT) 0.4 MG SL tablet Place 1 tablet (0.4 mg total) under the tongue every 5 (five) minutes x 3 doses as needed for chest pain. 03/02/17  Yes Bhagat, Bhavinkumar, PA  omeprazole (PRILOSEC OTC) 20 MG tablet Take 20 mg daily by mouth.    Yes [provider]  spironolactone (ALDACTONE) 25 MG tablet Take 0.5 tablets (12.5 mg total) by mouth daily. 03/02/17 03/02/18 Yes Bhagat, Bhavinkumar, PA      VITAL SIGNS:  Blood pressure 121/74, pulse 73, temperature 97.8 F (36.6 C), temperature source Oral, resp. rate 17, height 6\' 1"  (1.854 m), weight 105.7 kg (233 lb), SpO2 94 %.  PHYSICAL EXAMINATION:  GENERAL:  51 y.o.-year-old patient lying in the bed with no acute distress.  EYES: Pupils equal, round, reactive to light and accommodation. No scleral icterus. Extraocular muscles intact.  HEENT: Head atraumatic, normocephalic. Oropharynx and nasopharynx clear.  NECK:  Supple, no jugular venous distention. No thyroid enlargement, no tenderness.  LUNGS: Normal breath sounds bilaterally, no wheezing, rales,rhonchi or crepitation. No use of accessory muscles of respiration.   CARDIOVASCULAR: S1, S2 normal. No murmurs, rubs, or gallops.  ABDOMEN: Soft, nontender, nondistended. Bowel sounds present. No organomegaly or mass.  EXTREMITIES: No pedal edema, cyanosis, or clubbing.  NEUROLOGIC: Cranial nerves II through XII are intact. Muscle strength 5/5 in all extremities. Sensation intact. Gait not checked.  PSYCHIATRIC: The patient is alert and oriented x 3.  SKIN: No obvious rash, lesion, or ulcer.   LABORATORY PANEL:   CBC Recent Labs  Lab 04/06/17 1245  WBC 9.9  HGB 13.7  HCT 41.2  PLT 398   ------------------------------------------------------------------------------------------------------------------  Chemistries  Recent Labs  Lab 04/06/17 1245  NA 136  K 4.5  CL 101  CO2 23  GLUCOSE 123*  BUN 17  CREATININE 1.05  CALCIUM 9.9   ------------------------------------------------------------------------------------------------------------------  Cardiac Enzymes Recent Labs  Lab  04/06/17 1245  TROPONINI 1.67*   ------------------------------------------------------------------------------------------------------------------  RADIOLOGY:  Dg Chest 2 View  Result Date: 04/06/2017 CLINICAL DATA:  Chest pain. EXAM: CHEST  2 VIEW COMPARISON:  02/26/2017 . FINDINGS: Mediastinum and hilar structures normal. Cardiomegaly with normal pulmonary vascularity. No focal infiltrate. No pleural effusion or pneumothorax. IMPRESSION: Mild cardiomegaly with normal pulmonary vascularity. No focal infiltrate . Electronically Signed   By: Maisie Fus  Register   On: 04/06/2017 13:06    EKG:   Sinus rhythm with ST-T changes and anterolateral lead IMPRESSION AND PLAN:   Raiden Haydu  is a 51 y.o. male with a known history of coronary artery disease with three-vessel severe CAD on cardiac cath done October 2018, ischemic cardiomyopathy with EF of 20-25%, hypertension, GERD, tobacco abuse comes to the emergency room with on and off chest pain for last 3 days.    1.  Acute non-Q wave MI -Patient has three-vessel coronary artery disease that was noted on cardiac catheter done in October 2018.  Patient was sent to [facility] for possible CABG/PCI however patient was deemed not candidate for either and was sent home on cardiac meds including aspirin. -Patient was not prescribed any Plavix either (reason not known) -Admit to telemetry. -Audiology consultation with Dr. Mariah Milling.  ER physician discussed with cardiology -Continue IV nitro drip and heparin drip -Trend troponins  2.  Hyperlipidemia -Continue statin  3.  Ischemic cardiomyopathy -Continue Coreg, lisinopril, spironolactone  4.  Tobacco abuse smoking cessation advised more than 4 minutes spent.  Patient reports he wants his nicotine patch.  No family members present in the ER All the records are reviewed and case discussed with ED provider. Management plans discussed with the patient, family and they are in agreement.  CODE STATUS: Full  TOTAL TIME TAKING CARE OF THIS PATIENT: 50 minutes.    Enedina Finner M.D on 04/06/2017 at 2:24 PM  Between 7am to 6pm - Pager - [phone]  After 6pm go to [email or web address] - password [email or web address] [facility]  [facility]  Office  [phone]  CC: Primary care physician; Patient, [name] Per

## 2017-04-06 NOTE — Progress Notes (Addendum)
ANTICOAGULATION CONSULT NOTE - Initial Consult  Pharmacy Consult for Heparin Drip Indication: chest pain/ACS  Allergies  Allergen Reactions  . Penicillins Rash    Has patient had a PCN reaction causing immediate rash, facial/tongue/throat swelling, SOB or lightheadedness with hypotension: Yes Has patient had a PCN reaction causing severe rash involving mucus membranes or skin necrosis: No Has patient had a PCN reaction that required hospitalization: No Has patient had a PCN reaction occurring within the last 10 years: No If all of the above answers are "NO", then may proceed with Cephalosporin use.    Patient Measurements: Height: 6\' 1"  (185.4 cm) Weight: 233 lb (105.7 kg) IBW/kg (Calculated) : 79.9 Heparin Dosing Weight: 101.6 kg  Vital Signs: Temp: 98 F (36.7 C) (11/16 2117) Temp Source: Oral (11/16 2117) BP: 112/62 (11/16 2145) Pulse Rate: 67 (11/16 2145)  Labs: Recent Labs    04/06/17 1245 04/06/17 1359 04/06/17 1700 04/06/17 2058  HGB 13.7  --   --   --   HCT 41.2  --   --   --   PLT 398  --   --   --   APTT  --  32  --   --   LABPROT  --  12.7  --   --   INR  --  0.96  --   --   HEPARINUNFRC  --   --   --  0.40  CREATININE 1.05  --   --   --   TROPONINI 1.67*  --  2.82*  --     Estimated Creatinine Clearance: 107.4 mL/min (by C-G formula based on SCr of 1.05 mg/dL).   Medical History: Past Medical History:  Diagnosis Date  . Chest pain 02/2017  . Coronary artery disease    a. 02/2017 NSTEMI/Cath: LM 25, LAD 25ost, 70p, 6380m, 70/90d, D1/2 mod dzs, RI 80, LCX 2957m, OM1 small, OM2 mod dzs, RCA 100p, RPDA fills via L->R collats.  Marland Kitchen. ETOH abuse   . GERD (gastroesophageal reflux disease)    a. x 20 yrs  . Gout   . HFrEF (heart failure with reduced ejection fraction) (HCC)    a. 02/2017 Echo: EF 20-25%, diff HK, mild MR, mildly dil RA, mild TR.  Marland Kitchen. Hypertension    a. x 20 yrs - no meds since 2017.  . Ischemic cardiomyopathy    a. 02/2017 Echo: EF 20-25%,  diff HK.  . NSTEMI (non-ST elevated myocardial infarction) (HCC)    a. 02/2017--sev 3VD-->turned down for CABG-->Med Rx.  . Obesity   . Tobacco abuse     Medications:  Scheduled:  . [START ON 04/07/2017] aspirin EC  81 mg Oral Daily  . atorvastatin  80 mg Oral q1800  . carvedilol  6.25 mg Oral BID  . clopidogrel  75 mg Oral Daily  . [START ON 04/07/2017] lisinopril  5 mg Oral Daily  . [START ON 04/07/2017] pantoprazole  40 mg Oral Daily  . [START ON 04/07/2017] spironolactone  12.5 mg Oral Daily   Infusions:  . heparin 1,300 Units/hr (04/06/17 1417)  . nitroGLYCERIN 50 mcg/min (04/06/17 1921)    Assessment: Pharmacy consulted to dose a Heparin drip for this [age] yo male with chest pain x 3 nights ago.   Goal of Therapy:  Heparin level 0.3-0.7 units/ml Monitor platelets by anticoagulation protocol: Yes   Plan:  Give 4000 units bolus x 1 Start heparin infusion at 1300 units/hr Check anti-Xa level in 6 hours and daily while on heparin Continue  to monitor H&H and platelets    11/16 2100 heparin level 0.40. Continue current regimen. Recheck heparin level with AM labs to confirm. CBC ordered in AM also.   11/17 AM heparin level 0.43. Continue current regimen. Recheck heparin level and CBC with tomorrow AM labs.   Jennice Renegar S, RPH 04/06/2017,10:33 PM

## 2017-04-06 NOTE — ED Notes (Signed)
Called lab to verify blue top tube was received, states that they are unable to find the tube. Informed that this RN sent tube approx 15 minutes ago through tube system. Asked to call back when tube was found.

## 2017-04-06 NOTE — ED Notes (Signed)
Lab called back to state that they had found blue top tube and is received.

## 2017-04-07 DIAGNOSIS — I110 Hypertensive heart disease with heart failure: Secondary | ICD-10-CM

## 2017-04-07 DIAGNOSIS — I255 Ischemic cardiomyopathy: Secondary | ICD-10-CM

## 2017-04-07 DIAGNOSIS — I5022 Chronic systolic (congestive) heart failure: Secondary | ICD-10-CM

## 2017-04-07 LAB — CBC
HEMATOCRIT: 38.4 % — AB (ref 40.0–52.0)
HEMOGLOBIN: 13 g/dL (ref 13.0–18.0)
MCH: 29.7 pg (ref 26.0–34.0)
MCHC: 33.8 g/dL (ref 32.0–36.0)
MCV: 88 fL (ref 80.0–100.0)
Platelets: 347 10*3/uL (ref 150–440)
RBC: 4.36 MIL/uL — ABNORMAL LOW (ref 4.40–5.90)
RDW: 14.5 % (ref 11.5–14.5)
WBC: 10.9 10*3/uL — ABNORMAL HIGH (ref 3.8–10.6)

## 2017-04-07 LAB — TROPONIN I
Troponin I: 4.05 ng/mL (ref <0.03)
Troponin I: 4.62 ng/mL (ref <0.03)

## 2017-04-07 LAB — HEPARIN LEVEL (UNFRACTIONATED): Heparin Unfractionated: 0.43 IU/mL (ref 0.30–0.70)

## 2017-04-07 MED ORDER — SODIUM CHLORIDE 0.9% FLUSH
3.0000 mL | Freq: Two times a day (BID) | INTRAVENOUS | Status: DC
  Administered 2017-04-07 – 2017-04-10 (×4): 3 mL via INTRAVENOUS

## 2017-04-07 MED ORDER — ENOXAPARIN SODIUM 120 MG/0.8ML ~~LOC~~ SOLN
1.0000 mg/kg | Freq: Two times a day (BID) | SUBCUTANEOUS | Status: DC
  Administered 2017-04-07 – 2017-04-08 (×2): 105 mg via SUBCUTANEOUS
  Filled 2017-04-07 (×3): qty 0.8

## 2017-04-07 NOTE — Progress Notes (Signed)
ANTICOAGULATION CONSULT NOTE - Initial Consult  Pharmacy Consult for Heparin Drip Indication: chest pain/ACS  Allergies  Allergen Reactions  . Penicillins Rash    Has patient had a PCN reaction causing immediate rash, facial/tongue/throat swelling, SOB or lightheadedness with hypotension: Yes Has patient had a PCN reaction causing severe rash involving mucus membranes or skin necrosis: No Has patient had a PCN reaction that required hospitalization: No Has patient had a PCN reaction occurring within the last 10 years: No If all of the above answers are "NO", then may proceed with Cephalosporin use.    Patient Measurements: Height: 6\' 1"  (185.4 cm) Weight: 233 lb (105.7 kg) IBW/kg (Calculated) : 79.9 Heparin Dosing Weight: 101.6 kg  Vital Signs: Temp: 98.8 F (37.1 C) (11/17 0522) Temp Source: Oral (11/17 0522) BP: 109/71 (11/17 0522) Pulse Rate: 75 (11/17 0522)  Labs: Recent Labs    04/06/17 1245 04/06/17 1359 04/06/17 1700 04/06/17 2058 04/06/17 2333 04/07/17 0446  HGB 13.7  --   --   --   --  13.0  HCT 41.2  --   --   --   --  38.4*  PLT 398  --   --   --   --  347  APTT  --  32  --   --   --   --   LABPROT  --  12.7  --   --   --   --   INR  --  0.96  --   --   --   --   HEPARINUNFRC  --   --   --  0.40  --  0.43  CREATININE 1.05  --   --   --   --   --   TROPONINI 1.67*  --  2.82*  --  4.05* 4.62*    Estimated Creatinine Clearance: 107.4 mL/min (by C-G formula based on SCr of 1.05 mg/dL).   Medical History: Past Medical History:  Diagnosis Date  . Chest pain 02/2017  . Coronary artery disease    a. 02/2017 NSTEMI/Cath: LM 25, LAD 25ost, 70p, 652m, 70/90d, D1/2 mod dzs, RI 80, LCX 650m, OM1 small, OM2 mod dzs, RCA 100p, RPDA fills via L->R collats.  Marland Kitchen. ETOH abuse   . GERD (gastroesophageal reflux disease)    a. x 20 yrs  . Gout   . HFrEF (heart failure with reduced ejection fraction) (HCC)    a. 02/2017 Echo: EF 20-25%, diff HK, mild MR, mildly dil RA,  mild TR.  Marland Kitchen. Hypertension    a. x 20 yrs - no meds since 2017.  . Ischemic cardiomyopathy    a. 02/2017 Echo: EF 20-25%, diff HK.  . NSTEMI (non-ST elevated myocardial infarction) (HCC)    a. 02/2017--sev 3VD-->turned down for CABG-->Med Rx.  . Obesity   . Tobacco abuse     Medications:  Scheduled:  . aspirin EC  81 mg Oral Daily  . atorvastatin  80 mg Oral q1800  . carvedilol  6.25 mg Oral BID  . clopidogrel  75 mg Oral Daily  . enoxaparin (LOVENOX) injection  1 mg/kg Subcutaneous Q12H  . lisinopril  5 mg Oral Daily  . pantoprazole  40 mg Oral Daily  . spironolactone  12.5 mg Oral Daily   Infusions:  . nitroGLYCERIN 50 mcg/min (04/07/17 [id number])    Assessment: Pharmacy consulted to dose a Heparin drip for this 51 yo male with chest pain x 3 nights ago.   Goal of Therapy:  Heparin  level 0.3-0.7 units/ml Monitor platelets by anticoagulation protocol: Yes   Plan:  Give 4000 units bolus x 1 Start heparin infusion at 1300 units/hr Check anti-Xa level in 6 hours and daily while on heparin Continue to monitor H&H and platelets    11/16 2100 heparin level 0.40. Continue current regimen. Recheck heparin level with AM labs to confirm. CBC ordered in AM also.   11/17 AM heparin level 0.43. Continue current regimen. Recheck heparin level and CBC with tomorrow AM labs.   11/17: Per Dr. Elpidio AnisSudini note, patient refusing blood draws for aPTT checks. Transitioning heparin drip to therapeutic enoxaparin. Talked to nurse, Shanda BumpsJessica, and plan is to stop heparin drip and initiate enoxaparin ~1 hour after.   Cleopatra CedarStephanie Kindred Reidinger. PharmD  Pharmacy Resident  04/07/2017,12:43 PM

## 2017-04-07 NOTE — Progress Notes (Addendum)
[facility] -  at [facility]   PATIENT NAME: Bradley Bridges    MR#:  [id number]  DATE OF BIRTH:  October 25, 1965  SUBJECTIVE:  CHIEF COMPLAINT:   Chief Complaint  Patient presents with  . Chest Pain   Had chest pain all night. Now resolved  REVIEW OF SYSTEMS:    Review of Systems  Constitutional: Positive for malaise/fatigue. Negative for chills and fever.  HENT: Negative for sore throat.   Eyes: Negative for blurred vision, double vision and pain.  Respiratory: Negative for cough, hemoptysis, shortness of breath and wheezing.   Cardiovascular: Positive for chest pain. Negative for palpitations, orthopnea and leg swelling.  Gastrointestinal: Negative for abdominal pain, constipation, diarrhea, heartburn, nausea and vomiting.  Genitourinary: Negative for dysuria and hematuria.  Musculoskeletal: Negative for back pain and joint pain.  Skin: Negative for rash.  Neurological: Negative for sensory change, speech change, focal weakness and headaches.  Endo/Heme/Allergies: Does not bruise/bleed easily.  Psychiatric/Behavioral: Negative for depression. The patient is not nervous/anxious.     DRUG ALLERGIES:   Allergies  Allergen Reactions  . Penicillins Rash    Has patient had a PCN reaction causing immediate rash, facial/tongue/throat swelling, SOB or lightheadedness with hypotension: Yes Has patient had a PCN reaction causing severe rash involving mucus membranes or skin necrosis: No Has patient had a PCN reaction that required hospitalization: No Has patient had a PCN reaction occurring within the last 10 years: No If all of the above answers are "NO", then may proceed with Cephalosporin use.    VITALS:  Blood pressure 109/71, pulse 75, temperature 98.8 F (37.1 C), temperature source Oral, resp. rate 18, height 6\' 1"  (1.854 m), weight 105.7 kg (233 lb), SpO2 98 %.  PHYSICAL EXAMINATION:   Physical Exam  GENERAL:  51 y.o.-year-old patient lying in the  bed with no acute distress.  EYES: Pupils equal, round, reactive to light and accommodation. No scleral icterus. Extraocular muscles intact.  HEENT: Head atraumatic, normocephalic. Oropharynx and nasopharynx clear.  NECK:  Supple, no jugular venous distention. No thyroid enlargement, no tenderness.  LUNGS: Normal breath sounds bilaterally, no wheezing, rales, rhonchi. No use of accessory muscles of respiration.  CARDIOVASCULAR: S1, S2 normal. No murmurs, rubs, or gallops.  ABDOMEN: Soft, nontender, nondistended. Bowel sounds present. No organomegaly or mass.  EXTREMITIES: No cyanosis, clubbing or edema b/l.    NEUROLOGIC: Cranial nerves II through XII are intact. No focal Motor or sensory deficits b/l.   PSYCHIATRIC: The patient is alert and oriented x 3.  SKIN: No obvious rash, lesion, or ulcer.   LABORATORY PANEL:   CBC Recent Labs  Lab 04/07/17 0446  WBC 10.9*  HGB 13.0  HCT 38.4*  PLT 347   ------------------------------------------------------------------------------------------------------------------ Chemistries  Recent Labs  Lab 04/06/17 1245  NA 136  K 4.5  CL 101  CO2 23  GLUCOSE 123*  BUN 17  CREATININE 1.05  CALCIUM 9.9   ------------------------------------------------------------------------------------------------------------------  Cardiac Enzymes Recent Labs  Lab 04/07/17 0446  TROPONINI 4.62*   ------------------------------------------------------------------------------------------------------------------  RADIOLOGY:  Dg Chest 2 View  Result Date: 04/06/2017 CLINICAL DATA:  Chest pain. EXAM: CHEST  2 VIEW COMPARISON:  02/26/2017 . FINDINGS: Mediastinum and hilar structures normal. Cardiomegaly with normal pulmonary vascularity. No focal infiltrate. No pleural effusion or pneumothorax. IMPRESSION: Mild cardiomegaly with normal pulmonary vascularity. No focal infiltrate . Electronically Signed   By: Maisie Fushomas  Register   On: 04/06/2017 13:06      ASSESSMENT AND PLAN:   Bradley Bridges  is a 51 y.o. male with a known history of coronary artery disease with three-vessel severe CAD on cardiac cath done October 2018, ischemic cardiomyopathy with EF of 20-25%, hypertension, GERD, tobacco abuse comes to the emergency room with on and off chest pain for last 3 days.   1.  NSTEMI Had recent cath in 02/2017 and transfer to [place]. Not amenable to PCI or CABG Will wait for cardiology regarding further plan of cath vs medical management ON heparin, Nitro drip, asa, plavix, statin, BB  Switch to lovenox as patient refusing blood draws for aPTT check  2.  Hyperlipidemia -Continue statin  3.  Ischemic cardiomyopathy -Continue Coreg, lisinopril, spironolactone  4.  Tobacco abuse- counseled to quit on admission   All the records are reviewed and case discussed with Care Management/Social Worker Management plans discussed with the patient, family and they are in agreement.  CODE STATUS: FULL CODE  DVT Prophylaxis: SCDs  TOTAL TIME TAKING CARE OF THIS PATIENT: 30 minutes.   POSSIBLE D/C IN 1-2 DAYS, DEPENDING ON CLINICAL CONDITION.  Molinda BailiffSrikar R Barton Want M.D on 04/07/2017 at 12:13 PM  Between 7am to 6pm - Pager - [phone]  After 6pm go to [email or web address] - password [email or web address]  [facility] [place] [facility]  Office  [phone]  CC: Primary care physician; Patient, [name] Per  Note: This dictation was prepared with Dragon dictation along with smaller phrase technology. Any transcriptional errors that result from this process are unintentional.

## 2017-04-07 NOTE — Progress Notes (Signed)
   Progress Note   Subjective   Doing well today.  He has 1/10 chest pain last night, which is now resolved.  Denies SOB today.  Inpatient Medications    Scheduled Meds: . aspirin EC  81 mg Oral Daily  . atorvastatin  80 mg Oral q1800  . carvedilol  6.25 mg Oral BID  . clopidogrel  75 mg Oral Daily  . enoxaparin (LOVENOX) injection  1 mg/kg Subcutaneous Q12H  . lisinopril  5 mg Oral Daily  . pantoprazole  40 mg Oral Daily  . spironolactone  12.5 mg Oral Daily   Continuous Infusions: . nitroGLYCERIN 50 mcg/min (04/07/17 0605)   PRN Meds: acetaminophen **OR** acetaminophen, morphine injection, ondansetron **OR** ondansetron (ZOFRAN) IV, traMADol   Vital Signs    Vitals:   04/06/17 2202 04/06/17 2301 04/07/17 0007 04/07/17 0522  BP: (!) 93/50 107/79 (!) 107/59 109/71  Pulse: 68 62 69 75  Resp:   18 18  Temp:    98.8 F (37.1 C)  TempSrc:    Oral  SpO2: 97% 94% 97% 98%  Weight:      Height:        Intake/Output Summary (Last 24 hours) at 04/07/2017 1301 Last data filed at 04/07/2017 0800 Gross per 24 hour  Intake 677.4 ml  Output 210 ml  Net 467.4 ml   Filed Weights   04/06/17 1245  Weight: 233 lb (105.7 kg)    Telemetry    Sinus rhythm - Personally Reviewed  Physical Exam   GEN- The patient is well appearing, alert and oriented x 3 today.   Head- normocephalic, atraumatic Eyes-  Sclera clear, conjunctiva pink Ears- hearing intact Oropharynx- clear Neck- supple, Lungs- Clear to ausculation bilaterally, normal work of breathing Heart- Regular rate and rhythm  GI- soft, NT, ND, + BS Extremities- no clubbing, cyanosis, or edema  MS- no significant deformity or atrophy Skin- no rash or lesion Psych- euthymic mood, full affect Neuro- strength and sensation are intact   Labs    Chemistry Recent Labs  Lab 04/06/17 1245  NA 136  K 4.5  CL 101  CO2 23  GLUCOSE 123*  BUN 17  CREATININE 1.05  CALCIUM 9.9  GFRNONAA >60  GFRAA >60  ANIONGAP 12      Hematology Recent Labs  Lab 04/06/17 1245 04/07/17 0446  WBC 9.9 10.9*  RBC 4.68 4.36*  HGB 13.7 13.0  HCT 41.2 38.4*  MCV 87.9 88.0  MCH 29.3 29.7  MCHC 33.3 33.8  RDW 14.3 14.5  PLT 398 347    Cardiac Enzymes Recent Labs  Lab 04/06/17 1245 04/06/17 1700 04/06/17 2333 04/07/17 0446  TROPONINI 1.67* 2.82* 4.05* 4.62*   No results for input(s): TROPIPOC in the last 168 hours.      Assessment & Plan    1.  NSTEMI Robust elevation in CMs, with continued rise Continue IV heparin and ntg for now Unfortunately, very little options for revascularization Interventional team to re-evaluate on Monday to determine if further procedures would be beneficial Will keep in hospital until then  2. Ischemic CM EF 20-25% Continue medical therapy  3. HL On atorvastatin 80mg  daily  4. ETOH No signs or symptoms of withdrawal.  5. Tobacco Cessation is advised  6. Hypertensive cardiovascular disease Stable No change required today   Hillis RangeJames Seichi Kaufhold MD, [facility] 04/07/2017 1:01 PM

## 2017-04-08 DIAGNOSIS — Z720 Tobacco use: Secondary | ICD-10-CM

## 2017-04-08 LAB — TROPONIN I: TROPONIN I: 10.27 ng/mL — AB (ref <0.03)

## 2017-04-08 LAB — CBC
HCT: 37.1 % — ABNORMAL LOW (ref 40.0–52.0)
Hemoglobin: 12.4 g/dL — ABNORMAL LOW (ref 13.0–18.0)
MCH: 29.5 pg (ref 26.0–34.0)
MCHC: 33.4 g/dL (ref 32.0–36.0)
MCV: 88.1 fL (ref 80.0–100.0)
Platelets: 347 10*3/uL (ref 150–440)
RBC: 4.21 MIL/uL — ABNORMAL LOW (ref 4.40–5.90)
RDW: 14.4 % (ref 11.5–14.5)
WBC: 8.8 10*3/uL (ref 3.8–10.6)

## 2017-04-08 MED ORDER — ENOXAPARIN SODIUM 120 MG/0.8ML ~~LOC~~ SOLN
105.0000 mg | Freq: Two times a day (BID) | SUBCUTANEOUS | Status: DC
  Administered 2017-04-08: 105 mg via SUBCUTANEOUS
  Filled 2017-04-08 (×2): qty 0.8

## 2017-04-08 MED ORDER — ENOXAPARIN SODIUM 120 MG/0.8ML ~~LOC~~ SOLN: 105.0000 mg | Freq: Two times a day (BID) | SUBCUTANEOUS | Status: DC

## 2017-04-08 NOTE — Progress Notes (Signed)
[facility] - [place] [facility]   PATIENT NAME: Bradley Bridges    MR#:  [id number]  DATE OF BIRTH:  August 25, 1965  SUBJECTIVE:  CHIEF COMPLAINT:   Chief Complaint  Patient presents with  . Chest Pain   No chest pain or shortness of breath.  On nitro drip.  REVIEW OF SYSTEMS:    Review of Systems  Constitutional: Positive for malaise/fatigue. Negative for chills and fever.  HENT: Negative for sore throat.   Eyes: Negative for blurred vision, double vision and pain.  Respiratory: Negative for cough, hemoptysis, shortness of breath and wheezing.   Cardiovascular: Positive for chest pain. Negative for palpitations, orthopnea and leg swelling.  Gastrointestinal: Negative for abdominal pain, constipation, diarrhea, heartburn, nausea and vomiting.  Genitourinary: Negative for dysuria and hematuria.  Musculoskeletal: Negative for back pain and joint pain.  Skin: Negative for rash.  Neurological: Negative for sensory change, speech change, focal weakness and headaches.  Endo/Heme/Allergies: Does not bruise/bleed easily.  Psychiatric/Behavioral: Negative for depression. The patient is not nervous/anxious.     DRUG ALLERGIES:   Allergies  Allergen Reactions  . Penicillins Rash    Has patient had a PCN reaction causing immediate rash, facial/tongue/throat swelling, SOB or lightheadedness with hypotension: Yes Has patient had a PCN reaction causing severe rash involving mucus membranes or skin necrosis: No Has patient had a PCN reaction that required hospitalization: No Has patient had a PCN reaction occurring within the last 10 years: No If all of the above answers are "NO", then may proceed with Cephalosporin use.    VITALS:  Blood pressure 129/82, pulse 71, temperature 98 F (36.7 C), temperature source Oral, resp. rate 20, height 6\' 1"  (1.854 m), weight 105.7 kg (233 lb), SpO2 99 %.  PHYSICAL EXAMINATION:   Physical Exam  GENERAL:  51 y.o.-year-old patient  lying in the bed with no acute distress.  EYES: Pupils equal, round, reactive to light and accommodation. No scleral icterus. Extraocular muscles intact.  HEENT: Head atraumatic, normocephalic. Oropharynx and nasopharynx clear.  NECK:  Supple, no jugular venous distention. No thyroid enlargement, no tenderness.  LUNGS: Normal breath sounds bilaterally, no wheezing, rales, rhonchi. No use of accessory muscles of respiration.  CARDIOVASCULAR: S1, S2 normal. No murmurs, rubs, or gallops.  ABDOMEN: Soft, nontender, nondistended. Bowel sounds present. No organomegaly or mass.  EXTREMITIES: No cyanosis, clubbing or edema b/l.    NEUROLOGIC: Cranial nerves II through XII are intact. No focal Motor or sensory deficits b/l.   PSYCHIATRIC: The patient is alert and oriented x 3.  SKIN: No obvious rash, lesion, or ulcer.   LABORATORY PANEL:   CBC Recent Labs  Lab 04/08/17 0529  WBC 8.8  HGB 12.4*  HCT 37.1*  PLT 347   ------------------------------------------------------------------------------------------------------------------ Chemistries  Recent Labs  Lab 04/06/17 1245  NA 136  K 4.5  CL 101  CO2 23  GLUCOSE 123*  BUN 17  CREATININE 1.05  CALCIUM 9.9   ------------------------------------------------------------------------------------------------------------------  Cardiac Enzymes Recent Labs  Lab 04/08/17 0529  TROPONINI 10.27*   ------------------------------------------------------------------------------------------------------------------  RADIOLOGY:  Dg Chest 2 View  Result Date: 04/06/2017 CLINICAL DATA:  Chest pain. EXAM: CHEST  2 VIEW COMPARISON:  02/26/2017 . FINDINGS: Mediastinum and hilar structures normal. Cardiomegaly with normal pulmonary vascularity. No focal infiltrate. No pleural effusion or pneumothorax. IMPRESSION: Mild cardiomegaly with normal pulmonary vascularity. No focal infiltrate . Electronically Signed   By: Maisie Fushomas  Register   On: 04/06/2017  13:06     ASSESSMENT AND PLAN:  Bradley Bridges  is a 51 y.o. male with a known history of coronary artery disease with three-vessel severe CAD on cardiac cath done October 2018, ischemic cardiomyopathy with EF of 20-25%, hypertension, GERD, tobacco abuse comes to the emergency room with on and off chest pain for last 3 days.   1.  NSTEMI Had recent cath in 02/2017 and transfer to [place]. Not amenable to PCI or CABG Will wait for cardiology regarding further plan of cath vs medical management On Lovenox, Nitro drip, asa, plavix, statin, BB Troponin continues to trend upwards.  2.  Hyperlipidemia -Continue statin  3.  Ischemic cardiomyopathy -Continue Coreg, lisinopril, spironolactone  4.  Tobacco abuse- counseled to quit on admission   All the records are reviewed and case discussed with Care Management/Social Worker Management plans discussed with the patient, family and they are in agreement.  CODE STATUS: FULL CODE  DVT Prophylaxis: SCDs  TOTAL TIME TAKING CARE OF THIS PATIENT: 30 minutes.   POSSIBLE D/C IN 1-2 DAYS, DEPENDING ON CLINICAL CONDITION.  Bradley Bridges M.D on 04/08/2017 at 9:37 AM  Between 7am to 6pm - Pager - [phone]  After 6pm go to [email or web address] - password [email or web address]  [facility] [place] [facility]  Office  [phone]  CC: Primary care physician; Patient, [name] Per  Note: This dictation was prepared with Dragon dictation along with smaller phrase technology. Any transcriptional errors that result from this process are unintentional.

## 2017-04-08 NOTE — H&P (View-Only) (Signed)
   Progress Note   Subjective   Doing well today, the patient denies CP or SOB.  No new concerns  Inpatient Medications    Scheduled Meds: . aspirin EC  81 mg Oral Daily  . atorvastatin  80 mg Oral q1800  . carvedilol  6.25 mg Oral BID  . clopidogrel  75 mg Oral Daily  . enoxaparin  105 mg Subcutaneous Q12H  . lisinopril  5 mg Oral Daily  . pantoprazole  40 mg Oral Daily  . sodium chloride flush  3 mL Intravenous Q12H  . spironolactone  12.5 mg Oral Daily   Continuous Infusions: . nitroGLYCERIN 50 mcg/min (04/08/17 0007)   PRN Meds: acetaminophen **OR** acetaminophen, morphine injection, ondansetron **OR** ondansetron (ZOFRAN) IV, traMADol   Vital Signs    Vitals:   04/07/17 1940 04/08/17 0456 04/08/17 0745 04/08/17 1145  BP: 136/74 127/79 129/82 (!) 105/56  Pulse: 81 73 71 81  Resp: 18 17 20   Temp: 98.9 F (37.2 C) 98 F (36.7 C)    TempSrc: Oral Oral    SpO2: 99% 97% 99% 98%  Weight:      Height:        Intake/Output Summary (Last 24 hours) at 04/08/2017 1429 Last data filed at 04/08/2017 0800 Gross per 24 hour  Intake 877.75 ml  Output 1115 ml  Net -237.25 ml   Filed Weights   04/06/17 1245  Weight: 233 lb (105.7 kg)    Telemetry    Sinus rhythm - Personally Reviewed  Physical Exam   GEN- The patient is well appearing, alert and oriented x 3 today.   Head- normocephalic, atraumatic Eyes-  Sclera clear, conjunctiva pink Ears- hearing intact Oropharynx- clear Neck- supple, Lungs- Clear to ausculation bilaterally, normal work of breathing Heart- Regular rate and rhythm  GI- soft, NT, ND, + BS Extremities- no clubbing, cyanosis, or edema  MS- no significant deformity or atrophy Skin- no rash or lesion Psych- euthymic mood, full affect Neuro- strength and sensation are intact   Labs    Chemistry Recent Labs  Lab 04/06/17 1245  NA 136  K 4.5  CL 101  CO2 23  GLUCOSE 123*  BUN 17  CREATININE 1.05  CALCIUM 9.9  GFRNONAA >60    GFRAA >60  ANIONGAP 12     Hematology Recent Labs  Lab 04/06/17 1245 04/07/17 0446 04/08/17 0529  WBC 9.9 10.9* 8.8  RBC 4.68 4.36* 4.21*  HGB 13.7 13.0 12.4*  HCT 41.2 38.4* 37.1*  MCV 87.9 88.0 88.1  MCH 29.3 29.7 29.5  MCHC 33.3 33.8 33.4  RDW 14.3 14.5 14.4  PLT 398 347 347    Cardiac Enzymes Recent Labs  Lab 04/06/17 1700 04/06/17 2333 04/07/17 0446 04/08/17 0529  TROPONINI 2.82* 4.05* 4.62* 10.27*   No results for input(s): TROPIPOC in the last 168 hours.      Assessment & Plan    1.  NSTEMI Continue on lovenox and nitro drip Dr Arida to review cath films in am and determine if he is a candidate for PCI Will make NPO after midnight just in case No changes at this time.  2. Ischemic CM EF 20-25% Stable No change required today  3. HL On atorvastatin 80mg   4. Tobacco Cessation strongly advised  5. Hypertensive cardiovascular disease Stable No change required today   Irena Gaydos MD, FACC 04/08/2017 2:29 PM  

## 2017-04-08 NOTE — Progress Notes (Signed)
Troponin increase to 10.27/ Dr. Elpidio AnisSudini made aware/ no distress or c/o  chest pain from pt./ will continue to monitor.

## 2017-04-08 NOTE — Progress Notes (Signed)
   Progress Note   Subjective   Doing well today, the patient denies CP or SOB.  No new concerns  Inpatient Medications    Scheduled Meds: . aspirin EC  81 mg Oral Daily  . atorvastatin  80 mg Oral q1800  . carvedilol  6.25 mg Oral BID  . clopidogrel  75 mg Oral Daily  . enoxaparin  105 mg Subcutaneous Q12H  . lisinopril  5 mg Oral Daily  . pantoprazole  40 mg Oral Daily  . sodium chloride flush  3 mL Intravenous Q12H  . spironolactone  12.5 mg Oral Daily   Continuous Infusions: . nitroGLYCERIN 50 mcg/min (04/08/17 0007)   PRN Meds: acetaminophen **OR** acetaminophen, morphine injection, ondansetron **OR** ondansetron (ZOFRAN) IV, traMADol   Vital Signs    Vitals:   04/07/17 1940 04/08/17 0456 04/08/17 0745 04/08/17 1145  BP: 136/74 127/79 129/82 (!) 105/56  Pulse: 81 73 71 81  Resp: 18 17 20    Temp: 98.9 F (37.2 C) 98 F (36.7 C)    TempSrc: Oral Oral    SpO2: 99% 97% 99% 98%  Weight:      Height:        Intake/Output Summary (Last 24 hours) at 04/08/2017 1429 Last data filed at 04/08/2017 0800 Gross per 24 hour  Intake 877.75 ml  Output 1115 ml  Net -237.25 ml   Filed Weights   04/06/17 1245  Weight: 233 lb (105.7 kg)    Telemetry    Sinus rhythm - Personally Reviewed  Physical Exam   GEN- The patient is well appearing, alert and oriented x 3 today.   Head- normocephalic, atraumatic Eyes-  Sclera clear, conjunctiva pink Ears- hearing intact Oropharynx- clear Neck- supple, Lungs- Clear to ausculation bilaterally, normal work of breathing Heart- Regular rate and rhythm  GI- soft, NT, ND, + BS Extremities- no clubbing, cyanosis, or edema  MS- no significant deformity or atrophy Skin- no rash or lesion Psych- euthymic mood, full affect Neuro- strength and sensation are intact   Labs    Chemistry Recent Labs  Lab 04/06/17 1245  NA 136  K 4.5  CL 101  CO2 23  GLUCOSE 123*  BUN 17  CREATININE 1.05  CALCIUM 9.9  GFRNONAA >60    GFRAA >60  ANIONGAP 12     Hematology Recent Labs  Lab 04/06/17 1245 04/07/17 0446 04/08/17 0529  WBC 9.9 10.9* 8.8  RBC 4.68 4.36* 4.21*  HGB 13.7 13.0 12.4*  HCT 41.2 38.4* 37.1*  MCV 87.9 88.0 88.1  MCH 29.3 29.7 29.5  MCHC 33.3 33.8 33.4  RDW 14.3 14.5 14.4  PLT 398 347 347    Cardiac Enzymes Recent Labs  Lab 04/06/17 1700 04/06/17 2333 04/07/17 0446 04/08/17 0529  TROPONINI 2.82* 4.05* 4.62* 10.27*   No results for input(s): TROPIPOC in the last 168 hours.      Assessment & Plan    1.  NSTEMI Continue on lovenox and nitro drip Dr Kirke CorinArida to review cath films in am and determine if he is a candidate for PCI Will make NPO after midnight just in case No changes at this time.  2. Ischemic CM EF 20-25% Stable No change required today  3. HL On atorvastatin 80mg    4. Tobacco Cessation strongly advised  5. Hypertensive cardiovascular disease Stable No change required today   Hillis RangeJames Almando Brawley MD, [facility]ACC 04/08/2017 2:29 PM

## 2017-04-09 ENCOUNTER — Encounter
Admission: EM | Disposition: A | Discharge status: Home / Self Care | Payer: Self-pay | Source: Home / Self Care | Attending: Internal Medicine

## 2017-04-09 ENCOUNTER — Encounter: Payer: Self-pay | Admitting: Cardiovascular Disease

## 2017-04-09 HISTORY — PX: LEFT HEART CATH AND CORONARY ANGIOGRAPHY: CATH118249

## 2017-04-09 LAB — TROPONIN I: TROPONIN I: 8.54 ng/mL — AB (ref <0.03)

## 2017-04-09 LAB — CREATININE, SERUM
CREATININE: 1.27 mg/dL — AB (ref 0.61–1.24)
GFR calc Af Amer: >60 mL/min (ref >60)

## 2017-04-09 SURGERY — LEFT HEART CATH AND CORONARY ANGIOGRAPHY
Anesthesia: Moderate Sedation

## 2017-04-09 MED ORDER — SODIUM CHLORIDE 0.9% FLUSH: 3.0000 mL | INTRAVENOUS | Status: DC | PRN

## 2017-04-09 MED ORDER — SODIUM CHLORIDE 0.9 % IV SOLN: 250.0000 mL | INTRAVENOUS | Status: DC | PRN

## 2017-04-09 MED ORDER — HEPARIN SODIUM (PORCINE) 1000 UNIT/ML IJ SOLN
INTRAMUSCULAR | Status: DC | PRN
  Administered 2017-04-09: 5000 [IU] via INTRAVENOUS

## 2017-04-09 MED ORDER — SODIUM CHLORIDE 0.9 % WEIGHT BASED INFUSION: 100.0000 mL/h | INTRAVENOUS | Status: DC

## 2017-04-09 MED ORDER — HEPARIN (PORCINE) IN NACL 2-0.9 UNIT/ML-% IJ SOLN
INTRAMUSCULAR | Status: AC
  Filled 2017-04-09: qty 500

## 2017-04-09 MED ORDER — FENTANYL CITRATE (PF) 100 MCG/2ML IJ SOLN
INTRAMUSCULAR | Status: AC
  Filled 2017-04-09: qty 2

## 2017-04-09 MED ORDER — SODIUM CHLORIDE 0.9% FLUSH
3.0000 mL | Freq: Two times a day (BID) | INTRAVENOUS | Status: DC
  Administered 2017-04-09 – 2017-04-10 (×2): 3 mL via INTRAVENOUS

## 2017-04-09 MED ORDER — MIDAZOLAM HCL 2 MG/2ML IJ SOLN
INTRAMUSCULAR | Status: DC | PRN
  Administered 2017-04-09: 1 mg via INTRAVENOUS

## 2017-04-09 MED ORDER — HEPARIN SODIUM (PORCINE) 1000 UNIT/ML IJ SOLN
INTRAMUSCULAR | Status: AC
  Filled 2017-04-09: qty 1

## 2017-04-09 MED ORDER — SODIUM CHLORIDE 0.9 % IV SOLN: INTRAVENOUS | Status: AC

## 2017-04-09 MED ORDER — SODIUM CHLORIDE 0.9 % WEIGHT BASED INFUSION: 1.0000 mL/kg/h | INTRAVENOUS | Status: DC

## 2017-04-09 MED ORDER — IOPAMIDOL (ISOVUE-300) INJECTION 61%
INTRAVENOUS | Status: DC | PRN
  Administered 2017-04-09: 45 mL via INTRA_ARTERIAL

## 2017-04-09 MED ORDER — LIDOCAINE HCL (PF) 1 % IJ SOLN
INTRAMUSCULAR | Status: DC | PRN
  Administered 2017-04-09: 2 mL via INTRADERMAL

## 2017-04-09 MED ORDER — FENTANYL CITRATE (PF) 100 MCG/2ML IJ SOLN
INTRAMUSCULAR | Status: DC | PRN
  Administered 2017-04-09: 50 ug via INTRAVENOUS

## 2017-04-09 MED ORDER — MIDAZOLAM HCL 2 MG/2ML IJ SOLN
INTRAMUSCULAR | Status: AC
  Filled 2017-04-09: qty 2

## 2017-04-09 MED ORDER — ENOXAPARIN SODIUM 40 MG/0.4ML ~~LOC~~ SOLN
40.0000 mg | SUBCUTANEOUS | Status: DC
  Administered 2017-04-09: 40 mg via SUBCUTANEOUS
  Filled 2017-04-09: qty 0.4

## 2017-04-09 MED ORDER — SODIUM CHLORIDE 0.9% FLUSH: 3.0000 mL | Freq: Two times a day (BID) | INTRAVENOUS | Status: DC

## 2017-04-09 MED ORDER — HYDROCORTISONE 0.5 % EX CREA
TOPICAL_CREAM | Freq: Two times a day (BID) | CUTANEOUS | Status: DC | PRN
  Filled 2017-04-09: qty 28.35

## 2017-04-09 MED ORDER — VERAPAMIL HCL 2.5 MG/ML IV SOLN
INTRAVENOUS | Status: AC
  Filled 2017-04-09: qty 2

## 2017-04-09 MED ORDER — SODIUM CHLORIDE 0.9 % WEIGHT BASED INFUSION
3.0000 mL/kg/h | INTRAVENOUS | Status: DC
  Administered 2017-04-09: 3 mL/kg/h via INTRAVENOUS

## 2017-04-09 MED ORDER — LIDOCAINE HCL (PF) 1 % IJ SOLN
INTRAMUSCULAR | Status: AC
  Filled 2017-04-09: qty 30

## 2017-04-09 SURGICAL SUPPLY — 8 items
CATH 5FR PIGTAIL DIAGNOSTIC (CATHETERS) ×3 IMPLANT
CATH OPTITORQUE JACKY 4.0 5F (CATHETERS) ×3 IMPLANT
DEVICE RAD TR BAND REGULAR (VASCULAR PRODUCTS) ×3 IMPLANT
GLIDESHEATH SLEND SS 6F .021 (SHEATH) ×3 IMPLANT
KIT MANI 3VAL PERCEP (MISCELLANEOUS) ×3 IMPLANT
PACK CARDIAC CATH (CUSTOM PROCEDURE TRAY) ×3 IMPLANT
WIRE EMERALD 3MM-J .035X150CM (WIRE) ×3 IMPLANT
WIRE ROSEN-J .035X260CM (WIRE) ×3 IMPLANT

## 2017-04-09 NOTE — Progress Notes (Signed)
51 year old male with hx of alcohol abuse, tobacco abuse, obesity, CAD, IC, and recent admission with NSTEMI back in October 2018.  Patient had cath in October with dx of multivessel CAD - which has been managed medically due to patient being a poor surgical candidate.    Patient admitted on 04/06/2017 with dx of NSTEMI.  Patient's last echo was on 02/28/2017 with an EF of 20 - 25%.    Education provided to patient and sister.    NSTEMI education:   "Heart Attack Bouncing Back" booklet given and reviewed with patient and sister. Discussed modifiable risk factors including controlling blood pressure, cholesterol, and blood sugar; following heart healthy diet; maintaining healthy weight; exercise; and smoking cessation.   Discussed cardiac medications including rationale for taking, mechanisms of action, and side effects. ?  Discussed emergency plan for heart attack symptoms.  Discussed when to use NTG.  Patient described having chest pain daily in the days leading up to this admission.  He stated the chest pain was relieved after one to two nitroglycerin tab SL.   He also stated he called 911 after taking 3 nitroglycerin tabs SL when chest pain was not relieved. Patient verbalized understanding of need to call 911 and not to drive himself to ER if having cardiac symptoms / chest pain. ?  Heart healthy diet of low sodium, low fat, low cholesterol heart healthy diet discussed. Information on diet provided. Patient informed this RN that he tries to limit salt, but there are a few things that he just has to add a little salt.    Exercise - Patient does not currently exercise.  Benefits of exercise discussed.  Patient stated, "I try to be as active as I possibly can."  Discussed Cardiac Rehab with patient.  Patient stated he does not have insurance and he would not be able to pay out of pocket for Cardiac Rehab. Patient acknowledged that he been referred to Cardiac Rehab when he was here in October 2018.     CHF Education:  Provided patient with "Living Better with Heart Failure" packet. Briefly reviewed definition of heart failure and signs and symptoms of an exacerbation. Discussed the meaning of EF with patient. ? *Reviewed importance of and reason behind checking weight daily in the AM, after using the bathroom, but before getting dressed. Patient has scales and informed this RN that he weighs himself daily.  ?  Reviewed the following information with patient:  *Discussed when to call the Dr= weight gain of >3lb overnight of 5lb in a week,  *Discussed yellow zone= call MD: weight gain of >3lb overnight of 5lb in a week, increased swelling, increased SOB when lying down, chest discomfort, dizziness, increased fatigue.? *Red Zone= call 911: struggle to breath, fainting or near fainting, significant chest pain.  Patient was able to repeat this back to me. ? *Reviewed low sodium diet-provided handout of recommended and not recommended foods. Reviewed reading labels with patient.  ? *Instructed patient to take medications as prescribed for heart failure. Explained briefly why pt is on the medications (either make you feel better, live longer or keep you out of the hospital) and discussed monitoring and side effects.  ? *Discussed exercise. See above.    *Smoking Cessation - patient is a current smoker of 1/2 pack per day.  The effects of smoking on the body reviewed with patient.  Tips on quitting also provided.  Patient stated he is not ready to quit.   ? The role  of the [facility] discussed.  Patient is not an established patient of [facility].??Explained to patient this clinic does not replace his cardiologist, but provides an additional resource for him in helping him manage his HF at home. New patient appointment in the HF Clinic scheduled for Heart Failure Clinic f/u patient appointment scheduled for April 27, 2017 at 12:20 p.m.??  Note:  Patient has an appointment scheduled with  Dr. Okey DupreEnd on 04/18/2017 at 3:00 p.m.     ? Army Meliaiane Wright, RN, BSN, [facility]? [facility]  [facility] Cardiac &?Pulmonary Rehab  Cardiovascular &?Pulmonary Nurse Navigator  Direct Line: [phone]  Department Phone #: [phone] Fax: [phone]? Email Address: [email or web address] ?  ?

## 2017-04-09 NOTE — Progress Notes (Signed)
[facility] [place] [facility]   PATIENT NAME: Bradley Bridges    MR#:  [id number]  DATE OF BIRTH:  1966-04-10  SUBJECTIVE:  CHIEF COMPLAINT:   Chief Complaint  Patient presents with  . Chest Pain   No chest pain or shortness of breath.   Cath earlier today  REVIEW OF SYSTEMS:    Review of Systems  Constitutional: Positive for malaise/fatigue. Negative for chills and fever.  HENT: Negative for sore throat.   Eyes: Negative for blurred vision, double vision and pain.  Respiratory: Negative for cough, hemoptysis, shortness of breath and wheezing.   Cardiovascular: Positive for chest pain. Negative for palpitations, orthopnea and leg swelling.  Gastrointestinal: Negative for abdominal pain, constipation, diarrhea, heartburn, nausea and vomiting.  Genitourinary: Negative for dysuria and hematuria.  Musculoskeletal: Negative for back pain and joint pain.  Skin: Negative for rash.  Neurological: Negative for sensory change, speech change, focal weakness and headaches.  Endo/Heme/Allergies: Does not bruise/bleed easily.  Psychiatric/Behavioral: Negative for depression. The patient is not nervous/anxious.     DRUG ALLERGIES:   Allergies  Allergen Reactions  . Penicillins Rash    Has patient had a PCN reaction causing immediate rash, facial/tongue/throat swelling, SOB or lightheadedness with hypotension: Yes Has patient had a PCN reaction causing severe rash involving mucus membranes or skin necrosis: No Has patient had a PCN reaction that required hospitalization: No Has patient had a PCN reaction occurring within the last 10 years: No If all of the above answers are "NO", then may proceed with Cephalosporin use.    VITALS:  Blood pressure (!) 147/82, pulse 71, temperature 98 F (36.7 C), temperature source Oral, resp. rate 17, height 6\' 1"  (1.854 m), weight 105.7 kg (233 lb), SpO2 100 %.  PHYSICAL EXAMINATION:   Physical Exam  GENERAL:  51 y.o.-year-old  patient lying in the bed with no acute distress.  EYES: Pupils equal, round, reactive to light and accommodation. No scleral icterus. Extraocular muscles intact.  HEENT: Head atraumatic, normocephalic. Oropharynx and nasopharynx clear.  NECK:  Supple, no jugular venous distention. No thyroid enlargement, no tenderness.  LUNGS: Normal breath sounds bilaterally, no wheezing, rales, rhonchi. No use of accessory muscles of respiration.  CARDIOVASCULAR: S1, S2 normal. No murmurs, rubs, or gallops.  ABDOMEN: Soft, nontender, nondistended. Bowel sounds present. No organomegaly or mass.  EXTREMITIES: No cyanosis, clubbing or edema b/l.    NEUROLOGIC: Cranial nerves II through XII are intact. No focal Motor or sensory deficits b/l.   PSYCHIATRIC: The patient is alert and oriented x 3.  SKIN: No obvious rash, lesion, or ulcer.   LABORATORY PANEL:   CBC Recent Labs  Lab 04/08/17 0529  WBC 8.8  HGB 12.4*  HCT 37.1*  PLT 347   ------------------------------------------------------------------------------------------------------------------ Chemistries  Recent Labs  Lab 04/06/17 1245 04/09/17 0528  NA 136  --   K 4.5  --   CL 101  --   CO2 23  --   GLUCOSE 123*  --   BUN 17  --   CREATININE 1.05 1.27*  CALCIUM 9.9  --    ------------------------------------------------------------------------------------------------------------------  Cardiac Enzymes Recent Labs  Lab 04/09/17 0800  TROPONINI 8.54*   ------------------------------------------------------------------------------------------------------------------  RADIOLOGY:  No results found.   ASSESSMENT AND PLAN:   Bradley Bridges  is a 51 y.o. male with a known history of coronary artery disease with three-vessel severe CAD on cardiac cath done October 2018, ischemic cardiomyopathy with EF of 20-25%, hypertension, GERD, tobacco abuse came  to the emergency room with on and off chest pain for last 3 days.   1.  NSTEMI Had  recent cath in 02/2017 and transfer to [place]. Not amenable to PCI or CABG Today cath showed 1.  Severe three-vessel coronary artery disease with no significant change in coronary anatomy since most recent cardiac catheterization.  There are now well-developed collaterals from the LAD to the right coronary artery which seems to be a relatively large sized vessel distally. 2.  Left ventricular angiography was not performed.  Mildly elevated left ventricular end-diastolic pressure. On Asa, plavix, statin, BB Troponin continues to trend downwards.  OP surgery referral for CABG  No cardiac rehab referral till patient sees surgery as OP for CABG  2.  Hyperlipidemia -Continue statin  3.  Ischemic cardiomyopathy -Continue Coreg, lisinopril, spironolactone  4.  Tobacco abuse- counseled to quit on admission  All the records are reviewed and case discussed with Care Management/Social Worker Management plans discussed with the patient, family and they are in agreement.  CODE STATUS: FULL CODE  DVT Prophylaxis: SCDs  TOTAL TIME TAKING CARE OF THIS PATIENT: 30 minutes.   POSSIBLE D/C IN 1-2 DAYS, DEPENDING ON CLINICAL CONDITION.  Molinda BailiffSrikar R Karlis Cregg M.D on 04/09/2017 at 12:37 PM  Between 7am to 6pm - Pager - [phone]  After 6pm go to [email or web address] - password [email or web address]  [facility] [place] [facility]  Office  [phone]  CC: Primary care physician; Patient, [name] Per  Note: This dictation was prepared with Dragon dictation along with smaller phrase technology. Any transcriptional errors that result from this process are unintentional.

## 2017-04-09 NOTE — Progress Notes (Signed)
Progress Note  [facility] Date of Encounter: 04/09/2017  Primary Cardiologist: End  Subjective   No further chest pain. Troponin has currently trended to 10.27. For possible LHC this morning.   Inpatient Medications    Scheduled Meds: . aspirin EC  81 mg Oral Daily  . atorvastatin  80 mg Oral q1800  . carvedilol  6.25 mg Oral BID  . clopidogrel  75 mg Oral Daily  . enoxaparin  105 mg Subcutaneous Q12H  . lisinopril  5 mg Oral Daily  . pantoprazole  40 mg Oral Daily  . sodium chloride flush  3 mL Intravenous Q12H  . spironolactone  12.5 mg Oral Daily   Continuous Infusions: . nitroGLYCERIN 50 mcg/min (04/08/17 1904)   PRN Meds: acetaminophen **OR** acetaminophen, hydrocortisone cream, morphine injection, ondansetron **OR** ondansetron (ZOFRAN) IV, traMADol   Vital Signs    Vitals:   04/08/17 1145 04/08/17 1506 04/08/17 2013 04/09/17 0421  BP: (!) 105/56 112/76 (!) 141/84 127/78  Pulse: 81 76 87 71  Resp:  18 18 18   Temp:   98 F (36.7 C) 97.6 F (36.4 C)  TempSrc:   Oral Oral  SpO2: 98% 98% 98% 97%  Weight:      Height:        Intake/Output Summary (Last 24 hours) at 04/09/2017 0753 Last data filed at 04/09/2017 0602 Gross per 24 hour  Intake 835 ml  Output 1100 ml  Net -265 ml   Filed Weights   04/06/17 1245  Weight: 233 lb (105.7 kg)    Telemetry    NSR, PVCs - Personally Reviewed  ECG    n/a - Personally Reviewed  Physical Exam   GEN: No acute distress.   Neck: No JVD. Cardiac: RRR, no murmurs, rubs, or gallops.  Respiratory: Clear to auscultation bilaterally.  GI: Soft, nontender, non-distended.   MS: No edema; No deformity. Neuro:  Alert and oriented x 3; Nonfocal.  Psych: Normal affect.  Labs    Chemistry Recent Labs  Lab 04/06/17 1245 04/09/17 0528  NA 136  --   K 4.5  --   CL 101  --   CO2 23  --   GLUCOSE 123*  --   BUN 17  --   CREATININE 1.05 1.27*  CALCIUM 9.9  --   GFRNONAA >60 >60    GFRAA >60 >60  ANIONGAP 12  --      Hematology Recent Labs  Lab 04/06/17 1245 04/07/17 0446 04/08/17 0529  WBC 9.9 10.9* 8.8  RBC 4.68 4.36* 4.21*  HGB 13.7 13.0 12.4*  HCT 41.2 38.4* 37.1*  MCV 87.9 88.0 88.1  MCH 29.3 29.7 29.5  MCHC 33.3 33.8 33.4  RDW 14.3 14.5 14.4  PLT 398 347 347    Cardiac Enzymes Recent Labs  Lab 04/06/17 1700 04/06/17 2333 04/07/17 0446 04/08/17 0529  TROPONINI 2.82* 4.05* 4.62* 10.27*   No results for input(s): TROPIPOC in the last 168 hours.   BNPNo results for input(s): BNP, PROBNP in the last 168 hours.   DDimer No results for input(s): DDIMER in the last 168 hours.   Radiology    No results found.  Cardiac Studies   LHC 02/27/2017: Conclusions: 1. Severe 3-vessel coronary artery disease, including sequential proximal and mid LAD lesions of 70-80% as well as diffuse 70-90% distal LAD disease, 80% ramus stenosis, 70% mid LCx disease, and occluded proximal RCA with faint collateral filling of the distal branches. 2. Moderately elevated left  ventricular filling pressure (LVEDP 25 mmHg).  Recommendations: 1. [facility], though his surgical targets are suboptimal. I suspect the proximal RCA lesion was his culprit. However, given that his symptoms began >3 days ago, he is currently chest pain free, and his troponin has peaked, there is no indication for urgent PCI at this time. 2. Aggressive secondary prevention. 3. Gentle diuresis. 4. Obtain transthoracic echocardiogram.  TTE 02/28/2017: Study Conclusions  - Left ventricle: The cavity size was normal. Systolic function was   severely reduced. The estimated ejection fraction was in the   range of 20% to 25%. Diffuse hypokinesis. The study is not   technically sufficient to allow evaluation of LV diastolic   function. Acoustic contrast opacification revealed no evidence   ofthrombus. - Mitral valve: There was mild regurgitation. - Right  atrium: The atrium was mildly dilated. - Tricuspid valve: There was mild regurgitation.  Patient Profile     51 y.o. male with history of alcohol abuse, tobacco abuse, hypertension, obesity, CAD, ischemic cardiomyopathy, and recent admission with late presenting STEMI/non-STEMI with finding of multivessel coronary artery disease-being medically managed as he is a poor surgical candidate- who is being seen today for the evaluation of non-STEMI and chest pain.  Assessment & Plan    1. NSTEMI: -Currently chest pain free -Continue to cycle troponin until peak -Lovenox (last dose PM of 11/18), hold this AM -ASA and Plavix -For LHC this morning, possible staged procedure given multi-vessel disease -IV fluids for renal protection -NPO -Risks and benefits of cardiac catheterization have been discussed with the patient including risks of bleeding, bruising, infection, kidney damage, stroke, heart attack, and death. The patient understands these risks and is willing to proceed with the procedure. All questions have been answered and concerns listened to  2. ICM: -EF 20-25% -He does not appear grossly volume up -Coreg, lisinopril, spironolactone  -Consider transition to ARB followed by Sherryll BurgerEntresto as an outpatient -Recent echo as above  3. HLD: -Lipitor  4. Hypertensive heart disease: -BP well controlled  5. Tobacco abuse: -Cessation advised   For questions or updates, please contact [facility] Please consult [email or web address] for contact info under Cardiology/STEMI.    Signed, Eula Listenyan Remi Rester, PA-C [facility] Pager: [phone] 04/09/2017, 7:53 AM

## 2017-04-09 NOTE — Interval H&P Note (Signed)
History and Physical Interval Note:  04/09/2017 8:59 AM  Bradley Bridges  has presented today for surgery, with the diagnosis of NSTEMI  The various methods of treatment have been discussed with the patient and family. After consideration of risks, benefits and other options for treatment, the patient has consented to  Procedure(s): LEFT HEART CATH AND CORONARY ANGIOGRAPHY (N/A) as a surgical intervention .  The patient's history has been reviewed, patient examined, no change in status, stable for surgery.  I have reviewed the patient's chart and labs.  Questions were answered to the patient's satisfaction.     Lorine BearsMuhammad Masae Lukacs

## 2017-04-09 NOTE — Care Management Note (Signed)
Case Management Note  Patient Details  Name: Bradley Bridges MRN: [id number] Date of Birth: 12-01-1965  Subjective/Objective:                 Action/Plan:   Expected Discharge Date:                  Expected Discharge Plan:     In-House Referral:     Discharge planning Services     Post Acute Care Choice:    Choice offered to:     DME Arranged:    DME Agency:     HH Arranged:    HH Agency:     Status of Service:     If discussed at [employer] Length of Stay Meetings, dates discussed:    Additional Comments:  Eber HongGreene, Parmvir Boomer R, RN 04/09/2017, 12:53 PM

## 2017-04-10 LAB — MAGNESIUM: MAGNESIUM: 1.9 mg/dL (ref 1.7–2.4)

## 2017-04-10 LAB — CBC
HCT: 37.6 % — ABNORMAL LOW (ref 40.0–52.0)
Hemoglobin: 12.9 g/dL — ABNORMAL LOW (ref 13.0–18.0)
MCH: 30 pg (ref 26.0–34.0)
MCHC: 34.4 g/dL (ref 32.0–36.0)
MCV: 87.3 fL (ref 80.0–100.0)
PLATELETS: 376 10*3/uL (ref 150–440)
RBC: 4.3 MIL/uL — ABNORMAL LOW (ref 4.40–5.90)
RDW: 14.6 % — AB (ref 11.5–14.5)
WBC: 10 10*3/uL (ref 3.8–10.6)

## 2017-04-10 LAB — BASIC METABOLIC PANEL
Anion gap: 9 (ref 5–15)
BUN: 17 mg/dL (ref 6–20)
CALCIUM: 9.1 mg/dL (ref 8.9–10.3)
CO2: 21 mmol/L — ABNORMAL LOW (ref 22–32)
CREATININE: 1.09 mg/dL (ref 0.61–1.24)
Chloride: 104 mmol/L (ref 101–111)
GFR calc Af Amer: >60 mL/min (ref >60)
Glucose, Bld: 104 mg/dL — ABNORMAL HIGH (ref 65–99)
Potassium: 4 mmol/L (ref 3.5–5.1)
SODIUM: 134 mmol/L — AB (ref 135–145)

## 2017-04-10 LAB — TSH: TSH: 1.74 u[IU]/mL (ref 0.350–4.500)

## 2017-04-10 MED ORDER — CARVEDILOL 12.5 MG PO TABS
12.5000 mg | ORAL_TABLET | Freq: Two times a day (BID) | ORAL | Status: DC
  Administered 2017-04-10: 12.5 mg via ORAL
  Filled 2017-04-10: qty 1

## 2017-04-10 MED ORDER — CARVEDILOL 12.5 MG PO TABS: 12.5000 mg | ORAL_TABLET | Freq: Two times a day (BID) | ORAL | 0 refills | Status: DC

## 2017-04-10 MED ORDER — CLOPIDOGREL BISULFATE 75 MG PO TABS: 75.0000 mg | ORAL_TABLET | Freq: Every day | ORAL | 0 refills | Status: DC

## 2017-04-10 NOTE — Care Management (Signed)
Provided patient with applications for [facility] and faxed his scripts to the clinic.  CM has provided these resources on previous admissions and he has not followed through with applications

## 2017-04-10 NOTE — Discharge Instructions (Signed)
Heart healthy diet  Do not lift heavy weights or exert till you follow up with Dr. Laneta Simmersbartle

## 2017-04-10 NOTE — Progress Notes (Signed)
Patient is discharge home in a stable condition, summary and f/u care given, verbalized understanding . 

## 2017-04-10 NOTE — Discharge Summary (Signed)
[employer] [employer] [facility] [place] [facility]   PATIENT NAME: Bradley Bridges    MR#:  [id number]  DATE OF BIRTH:  [birth year]  DATE OF ADMISSION:  04/06/2017 ADMITTING PHYSICIAN: Enedina Finner, MD  DATE OF DISCHARGE: 04/10/2017 11:29 AM  PRIMARY CARE PHYSICIAN: Patient, [name] Per   ADMISSION DIAGNOSIS:  NSTEMI (non-ST elevated myocardial infarction) (HCC) [I21.4]  DISCHARGE DIAGNOSIS:  Active Problems:   NSTEMI (non-ST elevated myocardial infarction) (HCC)   SECONDARY DIAGNOSIS:   Past Medical History:  Diagnosis Date  . Chest pain 02/2017  . Coronary artery disease    a. 02/2017 NSTEMI/Cath: LM 25, LAD 25ost, 70p, 55m, 70/90d, D1/2 mod dzs, RI 80, LCX 44m, OM1 small, OM2 mod dzs, RCA 100p, RPDA fills via L->R collats.  Marland Kitchen ETOH abuse   . GERD (gastroesophageal reflux disease)    a. x 20 yrs  . Gout   . HFrEF (heart failure with reduced ejection fraction) (HCC)    a. 02/2017 Echo: EF 20-25%, diff HK, mild MR, mildly dil RA, mild TR.  Marland Kitchen Hypertension    a. x 20 yrs - no meds since 2017.  . Ischemic cardiomyopathy    a. 02/2017 Echo: EF 20-25%, diff HK.  . NSTEMI (non-ST elevated myocardial infarction) (HCC)    a. 02/2017--sev 3VD-->turned down for CABG-->Med Rx.  . Obesity   . Tobacco abuse      ADMITTING HISTORY  HISTORY OF PRESENT ILLNESS:  Bradley Bridges  is a 51 y.o. male with a known history of coronary artery disease with three-vessel severe CAD on cardiac cath done October 2018, ischemic cardiomyopathy with EF of 20-25%, hypertension, GERD, tobacco abuse comes to the emergency room with on and off chest pain for last 3 days.  Patient initially thought it was indigestion did not wear off came to the emergency room found to have troponin of 1.67.  He started on heparin drip along with nitroglycerin drip.  Patient rates his chest pain 2 out of 10.  He is being admitted with acute non-STEMI.     HOSPITAL COURSE:   * NSTEMI Patient was admitted to telemetry floor.   Started on heparin drip.  Continued on aspirin, Plavix, statin and beta-blockers. Had some mild chest pain initially which has completely resolved.  He had cardiac catheterization which showed multivessel disease not amenable to PCI.  These results were unchanged from his last cardiac catheterization.  He is thought to be stable at this time according to cardiology with no pain or shortness of breath.  He will need follow-up with Dr. Laneta Simmers with cardiothoracic surgery as outpatient in [place] to see if he would be a candidate for CABG. He will continue his medications.  Coreg dose increased.  Added Plavix.  Stable for discharge home.  Cardiac rehab referral not done as patient needs further workup with cardiothoracic surgery prior to this.  CONSULTS OBTAINED:  Treatment Team:  Antonieta Iba, MD  DRUG ALLERGIES:   Allergies  Allergen Reactions  . Penicillins Rash    Has patient had a PCN reaction causing immediate rash, facial/tongue/throat swelling, SOB or lightheadedness with hypotension: Yes Has patient had a PCN reaction causing severe rash involving mucus membranes or skin necrosis: No Has patient had a PCN reaction that required hospitalization: No Has patient had a PCN reaction occurring within the last 10 years: No If all of the above answers are "NO", then may proceed with Cephalosporin use.    DISCHARGE MEDICATIONS:   Discharge Medication List as  of 04/10/2017 10:26 AM    START taking these medications   Details  clopidogrel (PLAVIX) 75 MG tablet Take 1 tablet (75 mg total) daily by mouth., Starting Tue 04/10/2017, Normal      CONTINUE these medications which have CHANGED   Details  carvedilol (COREG) 12.5 MG tablet Take 1 tablet (12.5 mg total) 2 (two) times daily by mouth., Starting Tue 04/10/2017, Normal      CONTINUE these medications which have NOT CHANGED   Details  acetaminophen (TYLENOL) 500 MG tablet Take 1,000 mg by mouth every 4 (four) hours as  needed., Historical Med    aspirin EC 81 MG EC tablet Take 1 tablet (81 mg total) by mouth daily., Starting Sat 03/03/2017, Print    atorvastatin (LIPITOR) 80 MG tablet Take 1 tablet (80 mg total) by mouth daily at 6 PM., Starting Fri 03/02/2017, Print    isosorbide mononitrate (IMDUR) 30 MG 24 hr tablet Take 1 tablet (30 mg total) by mouth daily., Starting Sat 03/03/2017, Print    lisinopril (PRINIVIL,ZESTRIL) 5 MG tablet Take 1 tablet (5 mg total) by mouth daily., Starting Fri 03/16/2017, Until Thu 06/14/2017, Normal    nitroGLYCERIN (NITROSTAT) 0.4 MG SL tablet Place 1 tablet (0.4 mg total) under the tongue every 5 (five) minutes x 3 doses as needed for chest pain., Starting Fri 03/02/2017, Print    omeprazole (PRILOSEC OTC) 20 MG tablet Take 20 mg daily by mouth. , Historical Med    spironolactone (ALDACTONE) 25 MG tablet Take 0.5 tablets (12.5 mg total) by mouth daily., Starting Fri 03/02/2017, Until Sat 03/02/2018, Print        Today   VITAL SIGNS:  Blood pressure 135/87, pulse 92, temperature 98.4 F (36.9 C), temperature source Oral, resp. rate 18, height 6\' 1"  (1.854 m), weight 103.9 kg (229 lb 1.6 oz), SpO2 99 %.  I/O:    Intake/Output Summary (Last 24 hours) at 04/10/2017 1428 Last data filed at 04/10/2017 1035 Gross per 24 hour  Intake 720 ml  Output 750 ml  Net -30 ml    PHYSICAL EXAMINATION:  Physical Exam  GENERAL:  51 y.o.-year-old patient lying in the bed with no acute distress.  LUNGS: Normal breath sounds bilaterally, no wheezing, rales,rhonchi or crepitation. No use of accessory muscles of respiration.  CARDIOVASCULAR: S1, S2 normal. No murmurs, rubs, or gallops.  ABDOMEN: Soft, non-tender, non-distended. Bowel sounds present. No organomegaly or mass.  NEUROLOGIC: Moves all 4 extremities. PSYCHIATRIC: The patient is alert and oriented x 3.  SKIN: No obvious rash, lesion, or ulcer.   DATA REVIEW:   CBC Recent Labs  Lab 04/10/17 0826  WBC 10.0   HGB 12.9*  HCT 37.6*  PLT 376    Chemistries  Recent Labs  Lab 04/10/17 0826  NA 134*  K 4.0  CL 104  CO2 21*  GLUCOSE 104*  BUN 17  CREATININE 1.09  CALCIUM 9.1  MG 1.9    Cardiac Enzymes Recent Labs  Lab 04/09/17 0800  TROPONINI 8.54*    Microbiology Results  Results for orders placed or performed during the hospital encounter of 07/27/07  Culture, blood (routine x 2)     Status: None   Collection Time: 07/27/07 10:00 AM  Result Value Ref Range Status   Specimen Description BLOOD LEFT ARM  Final   Special Requests BOTTLES DRAWN AEROBIC ONLY 10CC  Final   Culture NO GROWTH 5 DAYS  Final   Report Status 08/02/2007 FINAL  Final  Culture, blood (routine  x 2)     Status: None   Collection Time: 07/27/07 10:05 AM  Result Value Ref Range Status   Specimen Description BLOOD LEFT ARM  Final   Special Requests BOTTLES DRAWN AEROBIC AND ANAEROBIC 10CC EA  Final   Culture NO GROWTH 5 DAYS  Final   Report Status 08/02/2007 FINAL  Final  Urine culture     Status: None   Collection Time: 07/27/07  3:35 PM  Result Value Ref Range Status   Specimen Description URINE, RANDOM  Final   Special Requests NONE  Final   Colony Count 10,000 COLONIES/ML  Final   Culture   Final    STAPHYLOCOCCUS SPECIES (COAGULASE NEGATIVE) Note: RIFAMPIN AND GENTAMICIN SHOULD NOT BE USED AS SINGLE DRUGS FOR TREATMENT OF STAPH INFECTIONS.   Report Status 07/31/2007 FINAL  Final   Organism ID, Bacteria STAPHYLOCOCCUS SPECIES (COAGULASE NEGATIVE)  Final      Susceptibility   Staphylococcus species (coagulase negative) - MIC    GENTAMICIN <=0.5 Sensitive     LEVOFLOXACIN <=0.12 Sensitive     NITROFURANTOIN <=16 Sensitive     OXACILLIN >=4 Resistant     PENICILLIN >=0.5 Resistant     RIFAMPIN <=0.5 Sensitive     VANCOMYCIN 1 Sensitive     TETRACYCLINE 2 Sensitive   Culture, sputum-assessment     Status: None   Collection Time: 07/27/07  4:36 PM  Result Value Ref Range Status   Specimen  Description SPUTUM  Final   Special Requests IMMUNE:NORM  Final   Sputum evaluation   Final    MICROSCOPIC FINDINGS SUGGEST THAT THIS SPECIMEN IS NOT REPRESENTATIVE OF LOWER RESPIRATORY SECRETIONS. PLEASE RECOLLECT. CALLED TO S.MCDOWELL RN AT 2001 BY L.PITT 07/27/07.   Report Status 07/27/2007 FINAL  Final  Rapid strep screen     Status: None   Collection Time: 07/27/07  8:08 PM  Result Value Ref Range Status   Streptococcus, Group A Screen (Direct) NEGATIVE  Final  Culture, sputum-assessment     Status: None   Collection Time: 07/28/07 12:40 PM  Result Value Ref Range Status   Specimen Description SPUTUM  Final   Special Requests IMMUNE:NORM  Final   Sputum evaluation   Final    THIS SPECIMEN IS ACCEPTABLE. RESPIRATORY CULTURE REPORT TO FOLLOW.   Report Status 07/28/2007 FINAL  Final  Gram stain     Status: None   Collection Time: 07/28/07 12:40 PM  Result Value Ref Range Status   Specimen Description SPUTUM  Final   Special Requests IMMUNE:NORM  Final   Gram Stain   Final    MODERATE WBC PRESENT, PREDOMINANTLY PMN FEW GRAM POSITIVE COCCI IN PAIRS   Report Status 07/28/2007 FINAL  Final  Culture, respiratory     Status: None   Collection Time: 07/28/07 12:40 PM  Result Value Ref Range Status   Specimen Description SPUTUM  Final   Special Requests NONE  Final   Gram Stain   Final    MODERATE WBC PRESENT, PREDOMINANTLY PMN FEW GRAM POSITIVE COCCI IN PAIRS   Culture NORMAL OROPHARYNGEAL FLORA  Final   Report Status 07/31/2007 FINAL  Final    RADIOLOGY:  No results found.  Follow up with PCP in 1 week.  Management plans discussed with the patient, family and they are in agreement.  CODE STATUS:     Code Status Orders  (From admission, onward)        Start     Ordered   04/06/17 1653  Full code  Continuous     04/06/17 1652    Code Status History    Date Active Date Inactive Code Status Order ID Comments User Context   02/27/2017 15:29 03/02/2017 16:09 Full Code  [id number]  Ok Anis, NP Inpatient   02/26/2017 20:52 02/27/2017 14:46 Full Code [id number]  Enid Baas, MD Inpatient      TOTAL TIME TAKING CARE OF THIS PATIENT ON DAY OF DISCHARGE: more than 30 minutes.   Molinda Bailiff Takyla Kuchera M.D on 04/10/2017 at 2:28 PM  Between 7am to 6pm - Pager - [phone]  After 6pm go to [email or web address] - password [email or web address]  [facility] [place] [facility]  Office  [phone]  CC: Primary care physician; Patient, [name] Per  Note: This dictation was prepared with Dragon dictation along with smaller phrase technology. Any transcriptional errors that result from this process are unintentional.

## 2017-04-10 NOTE — Progress Notes (Signed)
Progress Note  [name] Date of Encounter: 04/10/2017  Primary Cardiologist: End  Subjective   No chest pain or SOB. Underwent repeat LHC 11/19 with recommendation for re-referral to CVTS for reconsideration of CABG. Troponin peaked at 10.27. No bmet or cbc this morning. BP mildly elevated in the 150s systolic.   Inpatient Medications    Scheduled Meds: . aspirin EC  81 mg Oral Daily  . atorvastatin  80 mg Oral q1800  . carvedilol  6.25 mg Oral BID  . clopidogrel  75 mg Oral Daily  . enoxaparin (LOVENOX) injection  40 mg Subcutaneous Q24H  . lisinopril  5 mg Oral Daily  . pantoprazole  40 mg Oral Daily  . sodium chloride flush  3 mL Intravenous Q12H  . sodium chloride flush  3 mL Intravenous Q12H  . spironolactone  12.5 mg Oral Daily   Continuous Infusions: . sodium chloride    . nitroGLYCERIN Stopped (04/09/17 1600)   PRN Meds: sodium chloride, acetaminophen **OR** acetaminophen, hydrocortisone cream, morphine injection, ondansetron **OR** ondansetron (ZOFRAN) IV, sodium chloride flush, traMADol   Vital Signs    Vitals:   04/09/17 1100 04/09/17 1112 04/09/17 1940 04/10/17 0359  BP: (!) 166/94 (!) 147/82 (!) 142/83 (!) 154/92  Pulse: 75 71 85 85  Resp: 18 17 18  (!) 22  Temp:  98 F (36.7 C) 99.1 F (37.3 C) 98.2 F (36.8 C)  TempSrc:  Oral Oral Oral  SpO2: 98% 100% 98% 99%  Weight:    229 lb 1.6 oz (103.9 kg)  Height:        Intake/Output Summary (Last 24 hours) at 04/10/2017 0729 Last data filed at 04/10/2017 0020 Gross per 24 hour  Intake 480 ml  Output 750 ml  Net -270 ml   Filed Weights   04/06/17 1245 04/10/17 0359  Weight: 233 lb (105.7 kg) 229 lb 1.6 oz (103.9 kg)    Telemetry    Sinus tachycardia, 110s to 120s bpm, occasional PVCs - Personally Reviewed  ECG    n/a - Personally Reviewed  Physical Exam   GEN: No acute distress.   Neck: No JVD. Cardiac: RRR, no murmurs, rubs, or gallops. Right radial cath  site without bleeding, bruising, swelling, erythema, warmth or TTP. Radial pulse 2+. Respiratory: Clear to auscultation bilaterally.  GI: Soft, nontender, non-distended.   MS: No edema; No deformity. Neuro:  Alert and oriented x 3; Nonfocal.  Psych: Normal affect.  Labs    Chemistry Recent Labs  Lab 04/06/17 1245 04/09/17 0528  NA 136  --   K 4.5  --   CL 101  --   CO2 23  --   GLUCOSE 123*  --   BUN 17  --   CREATININE 1.05 1.27*  CALCIUM 9.9  --   GFRNONAA >60 >60  GFRAA >60 >60  ANIONGAP 12  --      Hematology Recent Labs  Lab 04/06/17 1245 04/07/17 0446 04/08/17 0529  WBC 9.9 10.9* 8.8  RBC 4.68 4.36* 4.21*  HGB 13.7 13.0 12.4*  HCT 41.2 38.4* 37.1*  MCV 87.9 88.0 88.1  MCH 29.3 29.7 29.5  MCHC 33.3 33.8 33.4  RDW 14.3 14.5 14.4  PLT 398 347 347    Cardiac Enzymes Recent Labs  Lab 04/06/17 2333 04/07/17 0446 04/08/17 0529 04/09/17 0800  TROPONINI 4.05* 4.62* 10.27* 8.54*   No results for input(s): TROPIPOC in the last 168 hours.   BNPNo results for input(s): BNP, PROBNP  in the last 168 hours.   DDimer No results for input(s): DDIMER in the last 168 hours.   Radiology    No results found.  Cardiac Studies   LHC 02/27/2017: Conclusions: 1. Severe 3-vessel coronary artery disease, including sequential proximal and mid LAD lesions of 70-80% as well as diffuse 70-90% distal LAD disease, 80% ramus stenosis, 70% mid LCx disease, and occluded proximal RCA with faint collateral filling of the distal branches. 2. Moderately elevated left ventricular filling pressure (LVEDP 25 mmHg).  Recommendations: 1. [facility], though his surgical targets are suboptimal. I suspect the proximal RCA lesion was his culprit. However, given that his symptoms began >3 days ago, he is currently chest pain free, and his troponin has peaked, there is no indication for urgent PCI at this time. 2. Aggressive secondary prevention. 3. Gentle  diuresis. 4. Obtain transthoracic echocardiogram.  TTE 02/28/2017: Study Conclusions  - Left ventricle: The cavity size was normal. Systolic function was severely reduced. The estimated ejection fraction was in the range of 20% to 25%. Diffuse hypokinesis. The study is not technically sufficient to allow evaluation of LV diastolic function. Acoustic contrast opacification revealed no evidence ofthrombus. - Mitral valve: There was mild regurgitation. - Right atrium: The atrium was mildly dilated. - Tricuspid valve: There was mild regurgitation.  LHC 04/09/2017: Coronary Findings   Diagnostic  Dominance: Right  Left Main  Vessel is large.  LM lesion 25% stenosed  LM lesion is 25% stenosed.  Left Anterior Descending  Vessel is large.  Ost LAD lesion 25% stenosed  Ost LAD lesion is 25% stenosed.  Prox LAD lesion 70% stenosed  Prox LAD lesion is 70% stenosed.  Mid LAD lesion 80% stenosed  Mid LAD lesion is 80% stenosed.  Dist LAD-1 lesion 70% stenosed  Dist LAD-1 lesion is 70% stenosed.  Dist LAD-2 lesion 90% stenosed  Dist LAD-2 lesion is 90% stenosed.  First Diagonal Branch  Vessel is moderate in size. Vessel is angiographically normal.  Ost 1st Diag to 1st Diag lesion 70% stenosed  Ost 1st Diag to 1st Diag lesion is 70% stenosed.  Second Diagonal Branch  Vessel is moderate in size. Vessel is angiographically normal.  Ost 2nd Diag to 2nd Diag lesion 80% stenosed  Ost 2nd Diag to 2nd Diag lesion is 80% stenosed.  Ramus Intermedius  Vessel is moderate in size.  Ramus lesion 80% stenosed  Ramus lesion is 80% stenosed.  Left Circumflex  Vessel is moderate in size.  Prox Cx to Dist Cx lesion 80% stenosed  Prox Cx to Dist Cx lesion is 80% stenosed.  First Obtuse Marginal Branch  Vessel is small in size.  Ost 1st Mrg to 1st Mrg lesion 90% stenosed  Ost 1st Mrg to 1st Mrg lesion is 90% stenosed.  Second Obtuse Marginal Branch  Vessel is moderate in size.  There is moderate disease in the vessel.  Lateral Second Obtuse Marginal Branch  There is moderate disease in the vessel.  Right Coronary Artery  Prox RCA lesion 100% stenosed  Prox RCA lesion is 100% stenosed.  Right Posterior Descending Artery  Collaterals  RPDA filled by collaterals from 2nd Sept.    Intervention   No interventions have been documented.  Coronary Diagrams   Diagnostic Diagram            Patient Profile     51 y.o. male with history of alcohol abuse, tobacco abuse, hypertension, obesity, CAD, ischemic cardiomyopathy, and recent admission with  late presenting STEMI/non-STEMI with finding of multivessel coronary artery disease-being medically managed as he is a poor surgical candidate-who is being seen today for the evaluation of non-STEMI and chest pain.  Assessment & Plan    1. NSTEMI: -Currently chest pain free -Troponin peaked at 10.27 -ASA and Plavix -Repeat LHC on 11/19 with recommendation for re-referral to CVTS for reconsideration of CABG as an outpatient -Check bmet and cbc  2. ICM: -EF 20-25% -He does not appear grossly volume up -Coreg, lisinopril, spironolactone  -Consider transition to ARB followed by Sherryll Burger as an outpatient -Recent echo as above  3. HLD: -Lipitor  4. Hypertensive heart disease: -BP well controlled  5. Tobacco abuse: -Cessation advised  6. Sinus tachycardia/PVCs: -Check magnesium and potassium -Check tsh -Increase Coreg    For questions or updates, please contact [facility] Please consult [email or web address] for contact info under Cardiology/STEMI.    Signed, Eula Listen, PA-C [facility] Pager: [phone] 04/10/2017, 7:29 AM

## 2017-04-10 NOTE — Plan of Care (Signed)
  Progressing Health Behavior/Discharge Planning: Ability to manage health-related needs will improve 04/10/2017 1022 - Progressing by Tomie ChinaJackson, Dreyden Rohrman Cecelie, RN Clinical Measurements: Ability to maintain clinical measurements within normal limits will improve 04/10/2017 1022 - Progressing by Tomie ChinaJackson, Chee Kinslow Cecelie, RN Will remain free from infection 04/10/2017 1022 - Progressing by Tomie ChinaJackson, Murrel Bertram Cecelie, RN Diagnostic test results will improve 04/10/2017 1022 - Progressing by Tomie ChinaJackson, Loc Feinstein Cecelie, RN Respiratory complications will improve 04/10/2017 1022 - Progressing by Tomie ChinaJackson, Tanzania Basham Cecelie, RN Cardiovascular complication will be avoided 04/10/2017 1022 - Progressing by Tomie ChinaJackson, Joshau Code Cecelie, RN Activity: Risk for activity intolerance will decrease 04/10/2017 1022 - Progressing by Tomie ChinaJackson, Suhana Wilner Cecelie, RN Coping: Level of anxiety will decrease 04/10/2017 1022 - Progressing by Tomie ChinaJackson, Chanita Boden Cecelie, RN Elimination: Will not experience complications related to bowel motility 04/10/2017 1022 - Progressing by Tomie ChinaJackson, Kamee Bobst Cecelie, RN Will not experience complications related to urinary retention 04/10/2017 1022 - Progressing by Tomie ChinaJackson, Ernesto Zukowski Cecelie, RN Pain Managment: General experience of comfort will improve 04/10/2017 1022 - Progressing by Tomie ChinaJackson, Johnel Yielding Cecelie, RN Safety: Ability to remain free from injury will improve 04/10/2017 1022 - Progressing by Tomie ChinaJackson, Shawnya Mayor Cecelie, RN Skin Integrity: Risk for impaired skin integrity will decrease 04/10/2017 1022 - Progressing by Tomie ChinaJackson, Sharonlee Nine Cecelie, RN

## 2017-04-16 ENCOUNTER — Telehealth: Payer: Self-pay | Admitting: Nurse Practitioner

## 2017-04-16 NOTE — Telephone Encounter (Signed)
Pt calling stating he's gained 3 pounds over night Has no other changes he states just the weight gain.  Would like some advise   Please call back

## 2017-04-16 NOTE — Telephone Encounter (Signed)
I'm not sure if increased spironolactone will do much for his weight, as it is a fairly weak diuretic. He may need to be started on furosemide. If he otherwise feels ok, I think it is fine to defer this to his appointment with Ward Givenshris Berge, NP, tomorrow.  Yvonne Kendallhristopher Nikita Surman, MD [facility] Pager: [phone]

## 2017-04-16 NOTE — Telephone Encounter (Signed)
S/w patient. He states he has 3 lb weight gain overnight. Today's weight 11/26 232lb   11/25 229.4 lb   11/24 228.2lb   11/23 227.1 lb.  [facility] was 229 lb 1.6 oz. Patient denies shortness of breath, chest pain, palpitations, dizziness or any other symptoms. Verified patient's med list and he is taking as prescribed.  Advised patient to take spironolactone 25 mg (1 tablet) today verses his normal 1/2 tablet. Patient has appointment tomorrow with Ward Givenshris Berge, NP. Patient verbalized understanding to take extra dose of spironolactone and to keep appointment tomorrow. Will route to Dr End for review.

## 2017-04-17 ENCOUNTER — Ambulatory Visit (INDEPENDENT_AMBULATORY_CARE_PROVIDER_SITE_OTHER): Payer: Self-pay | Admitting: Nurse Practitioner

## 2017-04-17 ENCOUNTER — Encounter: Payer: Self-pay | Admitting: Nurse Practitioner

## 2017-04-17 VITALS — BP 132/80 | HR 67 | Ht 73.0 in | Wt 233.2 lb

## 2017-04-17 DIAGNOSIS — I214 Non-ST elevation (NSTEMI) myocardial infarction: Secondary | ICD-10-CM

## 2017-04-17 DIAGNOSIS — I5022 Chronic systolic (congestive) heart failure: Secondary | ICD-10-CM

## 2017-04-17 DIAGNOSIS — I2510 Atherosclerotic heart disease of native coronary artery without angina pectoris: Secondary | ICD-10-CM

## 2017-04-17 DIAGNOSIS — I255 Ischemic cardiomyopathy: Secondary | ICD-10-CM

## 2017-04-17 MED ORDER — SPIRONOLACTONE 25 MG PO TABS: 25.0000 mg | ORAL_TABLET | Freq: Every day | ORAL | 6 refills | Status: DC

## 2017-04-17 NOTE — Patient Instructions (Signed)
Medication Instructions:  Please INCREASE your spironolactone to a while 25 mg tablet once daily  Labwork: BMET in 1 week  Testing/Procedures: None  Follow-Up: 1 month with Dr. Okey DupreEnd.  If you need a refill on your cardiac medications before your next appointment, please call your pharmacy.

## 2017-04-17 NOTE — Progress Notes (Signed)
Office Visit    [facility] Date of Encounter: 04/17/2017  Primary Care Provider:  Patient, [name] Per Primary Cardiologist:  Wallace Cullens. End, MD   Chief Complaint    51 y/o ? with a history of alcohol abuse, tobacco abuse, hypertension, obesity, CAD, and ischemic cardiomyopathy, who presents for follow-up after recent recurrent non-STEMI.  Past Medical History    Past Medical History:  Diagnosis Date  . Coronary artery disease    a. 02/2017 NSTEMI/Cath: LM 25, LAD 25ost, 70p, 8842m, 70/90d, D1/2 mod dzs, RI 80, LCX 4862m, OM1 small, OM2 mod dzs, RCA 100p, RPDA fills via L->R collats-->Not felt to be a good surgical candidate-->Med Rx; b. 03/2017 NSTEMI/Cath: LM 25, LAD 25ost, 70p, 6842m, 70/90d, D1 70ost, D2 80ost, RI 80, LCX 80p->d, OM1 90, RCA 100p, RPDA fills from L->R collats-->Med Rx.  . ETOH abuse   . GERD (gastroesophageal reflux disease)    a. x 20 yrs  . Gout   . HFrEF (heart failure with reduced ejection fraction) (HCC)    a. 02/2017 Echo: EF 20-25%, diff HK, mild MR, mildly dil RA, mild TR.  Marland Kitchen. Hypertension    a. x 20 yrs - no meds since 2017.  . Ischemic cardiomyopathy    a. 02/2017 Echo: EF 20-25%, diff HK.  . NSTEMI (non-ST elevated myocardial infarction) (HCC)    a. 02/2017--sev 3VD-->turned down for CABG-->Med Rx.  . Obesity   . Tobacco abuse    Past Surgical History:  Procedure Laterality Date  . LEFT HEART CATH AND CORONARY ANGIOGRAPHY N/A 02/27/2017   Procedure: LEFT HEART CATH AND CORONARY ANGIOGRAPHY;  Surgeon: Yvonne KendallEnd, Yaslin Kirtley, MD;  Location: [facility] INVASIVE CV LAB;  Service: Cardiovascular;  Laterality: N/A;  . LEFT HEART CATH AND CORONARY ANGIOGRAPHY N/A 04/09/2017   Procedure: LEFT HEART CATH AND CORONARY ANGIOGRAPHY;  Surgeon: Iran OuchArida, Muhammad A, MD;  Location: [facility] INVASIVE CV LAB;  Service: Cardiovascular;  Laterality: N/A;    Allergies  Allergies  Allergen Reactions  . Penicillins Rash    Has patient had a PCN reaction causing  immediate rash, facial/tongue/throat swelling, SOB or lightheadedness with hypotension: Yes Has patient had a PCN reaction causing severe rash involving mucus membranes or skin necrosis: No Has patient had a PCN reaction that required hospitalization: No Has patient had a PCN reaction occurring within the last 10 years: No If all of the above answers are "NO", then may proceed with Cephalosporin use.    History of Present Illness    51 year old male with the above complex past medical history including hypertension, obesity, tobacco, and alcohol abuse.  In October, he was admitted to [facility] with a late presenting ST elevation MI.  Catheterization revealed significant multivessel disease and [facility] but was felt to be a poor candidate secondary tobacco abuse and LV dysfunction (see EF of 20-25%).  Medical therapy was recommended.  He was seen in clinic October 25 and was doing well was subsequently placed on ACE inhibitor therapy.  Unfortunately, he developed recurrent chest pain and was readmitted November 16 and peaked his troponin at 10.27.  He underwent repeat catheterization revealing no significant change in coronary anatomy since prior catheterization though collaterals from the LAD to the distal right coronary artery were felt to be more well-developed.  Medical therapy was again recommended as well as follow-up with CT surgery to reassess candidacy for bypass.  Since his hospitalization, he has not had  any chest pain.  He denies dyspnea, palpitations, PND, orthopnea, dizziness, syncope, edema, or early satiety.  His weight was up 3 pounds yesterday after eating copious amounts of Thanksgiving ham over the weekend.  He has not had any symptoms or limitations.  Home Medications    Prior to Admission medications   Medication Sig Start Date End Date Taking? Authorizing Provider  acetaminophen (TYLENOL) 500 MG tablet Take 1,000 mg by  mouth every 4 (four) hours as needed.   Yes [provider]  aspirin EC 81 MG EC tablet Take 1 tablet (81 mg total) by mouth daily. 03/03/17  Yes Bhagat, Bhavinkumar, PA  atorvastatin (LIPITOR) 80 MG tablet Take 1 tablet (80 mg total) by mouth daily at 6 PM. 03/02/17  Yes Bhagat, Bhavinkumar, PA  carvedilol (COREG) 12.5 MG tablet Take 1 tablet (12.5 mg total) 2 (two) times daily by mouth. 04/10/17  Yes Sudini, Wardell Heath, MD  clopidogrel (PLAVIX) 75 MG tablet Take 1 tablet (75 mg total) daily by mouth. 04/10/17  Yes Sudini, Wardell Heath, MD  isosorbide mononitrate (IMDUR) 30 MG 24 hr tablet Take 1 tablet (30 mg total) by mouth daily. 03/03/17  Yes Bhagat, Bhavinkumar, PA  lisinopril (PRINIVIL,ZESTRIL) 5 MG tablet Take 1 tablet (5 mg total) by mouth daily. 03/16/17 06/14/17 Yes Ok Anis, NP  nitroGLYCERIN (NITROSTAT) 0.4 MG SL tablet Place 1 tablet (0.4 mg total) under the tongue every 5 (five) minutes x 3 doses as needed for chest pain. 03/02/17  Yes Bhagat, Bhavinkumar, PA  omeprazole (PRILOSEC OTC) 20 MG tablet Take 20 mg daily by mouth.    Yes [provider]  spironolactone (ALDACTONE) 25 MG tablet Take 1 tablet (25 mg total) by mouth daily. 04/17/17 04/17/18 Yes Ok Anis, NP    Review of Systems    Recent readmission with non-STEMI.  Since then, he denies chest pain, dyspnea, palpitations, PND, orthopnea, dizziness, syncope, edema, or early satiety..  All other systems reviewed and are otherwise negative except as noted above.  Physical Exam    VS:  BP 132/80 (BP Location: Left Arm, Patient Position: Sitting, Cuff Size: Normal)   Pulse 67   Ht 6\' 1"  (1.854 m)   Wt 233 lb 4 oz (105.8 kg)   BMI 30.77 kg/m  , BMI Body mass index is 30.77 kg/m. GEN: Well nourished, well developed, in no acute distress.  HEENT: normal.  Neck: Supple, no JVD, carotid bruits, or masses. Cardiac: RRR, no murmurs, rubs, or gallops. No clubbing, cyanosis, edema.  Radials/DP/PT 2+  and equal bilaterally.  Right wrist catheterization site without bleeding, bruit, or hematoma.  Respiratory:  Respirations regular and unlabored, clear to auscultation bilaterally. GI: Soft, nontender, nondistended, BS + x 4. MS: no deformity or atrophy. Skin: warm and dry, no rash. Neuro:  Strength and sensation are intact. Psych: Normal affect.  Accessory Clinical Findings    ECG - Regular sinus rhythm, 67, left axis deviation, left atrial enlargement, prior inferior infarct, inferolateral ST and T abnormalities.  No acute changes.  Assessment & Plan    1.  Non-STEMI, subsequent episode of care/coronary artery disease: Patient was admitted in October with a late presenting STEMI and found to have multivessel coronary artery disease.  He was not felt to be a good candidate for surgery based on LV dysfunction, tobacco abuse, diffuse and small vessel nature of disease, and poor distal targets.  He represented earlier this month with recurrent chest pain and troponin to greater than 10.  Catheterization showed  similar anatomy as it did in October.  No targets for PCI.  He has been medically managed with aspirin, statin, beta-blocker, Plavix, nitrate, ACE inhibitor, and spironolactone.  He has not had any chest pain since discharge.  He cannot afford cardiac rehab.  Recommendation was made for follow-up with CT surgery and he has an appointment on December 12.  2.  Ischemic cardiomyopathy/HFrEF: EF 20-25% with diffuse hypokinesis by echo in October 2018.  He gained 3 pounds over the weekend in the setting of eating copious amounts of Thanksgiving him.  He is euvolemic on exam today.  Blood pressure is stable.  Renal function stable during recent hospitalization.  I will take this opportunity to increase his spironolactone to 25 mg daily.  I do not think he requires Lasix at this time.  Continue beta-blocker and ACE inhibitor therapy.  He was not able to afford losartan and as a result, I do not think  Sherryll Burgerntresto will be an option for us.  Pending CT surgical evaluation, we will have to decide on timing of follow-up echocardiogram to reevaluate LV function and determine candidacy for ICD therapy.  Follow-up basic metabolic panel in 1 week.  3.  Tobacco abuse: He continues to smoke 5 cigarettes a day.  He rolls his own.  He cannot afford patches.  4.  Alcohol abuse: He has not had a drink since his initial MI.  5.  Hyperlipidemia: LDL was 108 in October.  Continue high potency statin therapy.  We will have to arrange for fasting lipids and LFTs at follow-up visit.  6.  Hypertensive heart disease: Blood pressure stable.  7.  Disposition: Follow-up basic metabolic panel in 1 week.  He is CT surgical follow-up in December 12.  Follow-up with Dr. Okey DupreEnd in 1 month or sooner if necessary.  Nicolasa Duckinghristopher Sunita Demond, NP 04/17/2017, 5:35 PM

## 2017-04-24 ENCOUNTER — Other Ambulatory Visit: Payer: Self-pay

## 2017-04-24 DIAGNOSIS — I214 Non-ST elevation (NSTEMI) myocardial infarction: Secondary | ICD-10-CM

## 2017-04-25 ENCOUNTER — Other Ambulatory Visit
Admission: RE | Admit: 2017-04-25 | Discharge: 2017-04-25 | Disposition: A | Discharge status: Home / Self Care | Payer: Medicaid Other | Source: Ambulatory Visit | Attending: Nurse Practitioner | Admitting: Nurse Practitioner

## 2017-04-25 DIAGNOSIS — I214 Non-ST elevation (NSTEMI) myocardial infarction: Secondary | ICD-10-CM | POA: Diagnosis not present

## 2017-04-25 LAB — BASIC METABOLIC PANEL
ANION GAP: 9 (ref 5–15)
BUN: 11 mg/dL (ref 6–20)
CALCIUM: 9.3 mg/dL (ref 8.9–10.3)
CO2: 24 mmol/L (ref 22–32)
CREATININE: 1.2 mg/dL (ref 0.61–1.24)
Chloride: 104 mmol/L (ref 101–111)
GLUCOSE: 143 mg/dL — AB (ref 65–99)
Potassium: 4 mmol/L (ref 3.5–5.1)
Sodium: 137 mmol/L (ref 135–145)

## 2017-04-27 ENCOUNTER — Other Ambulatory Visit: Payer: Self-pay

## 2017-04-27 ENCOUNTER — Ambulatory Visit: Payer: Self-pay | Admitting: Family

## 2017-04-27 ENCOUNTER — Ambulatory Visit: Payer: Self-pay

## 2017-05-02 ENCOUNTER — Ambulatory Visit: Payer: Self-pay | Admitting: Surgery

## 2017-05-04 ENCOUNTER — Ambulatory Visit: Payer: Medicaid Other | Attending: Family | Admitting: Family

## 2017-05-04 ENCOUNTER — Encounter: Payer: Self-pay | Admitting: Family

## 2017-05-04 VITALS — BP 137/77 | HR 80 | Resp 18 | Ht 73.0 in | Wt 238.1 lb

## 2017-05-04 DIAGNOSIS — I110 Hypertensive heart disease with heart failure: Secondary | ICD-10-CM | POA: Insufficient documentation

## 2017-05-04 DIAGNOSIS — Z8249 Family history of ischemic heart disease and other diseases of the circulatory system: Secondary | ICD-10-CM | POA: Insufficient documentation

## 2017-05-04 DIAGNOSIS — Z833 Family history of diabetes mellitus: Secondary | ICD-10-CM | POA: Insufficient documentation

## 2017-05-04 DIAGNOSIS — I2510 Atherosclerotic heart disease of native coronary artery without angina pectoris: Secondary | ICD-10-CM

## 2017-05-04 DIAGNOSIS — F17210 Nicotine dependence, cigarettes, uncomplicated: Secondary | ICD-10-CM | POA: Diagnosis not present

## 2017-05-04 DIAGNOSIS — I252 Old myocardial infarction: Secondary | ICD-10-CM | POA: Diagnosis not present

## 2017-05-04 DIAGNOSIS — K219 Gastro-esophageal reflux disease without esophagitis: Secondary | ICD-10-CM | POA: Insufficient documentation

## 2017-05-04 DIAGNOSIS — Z8379 Family history of other diseases of the digestive system: Secondary | ICD-10-CM | POA: Insufficient documentation

## 2017-05-04 DIAGNOSIS — Z880 Allergy status to penicillin: Secondary | ICD-10-CM | POA: Diagnosis not present

## 2017-05-04 DIAGNOSIS — Z7982 Long term (current) use of aspirin: Secondary | ICD-10-CM | POA: Diagnosis not present

## 2017-05-04 DIAGNOSIS — Z720 Tobacco use: Secondary | ICD-10-CM

## 2017-05-04 DIAGNOSIS — Z6831 Body mass index (BMI) 31.0-31.9, adult: Secondary | ICD-10-CM | POA: Diagnosis not present

## 2017-05-04 DIAGNOSIS — Z7902 Long term (current) use of antithrombotics/antiplatelets: Secondary | ICD-10-CM | POA: Diagnosis not present

## 2017-05-04 DIAGNOSIS — I509 Heart failure, unspecified: Secondary | ICD-10-CM | POA: Diagnosis present

## 2017-05-04 DIAGNOSIS — I10 Essential (primary) hypertension: Secondary | ICD-10-CM | POA: Insufficient documentation

## 2017-05-04 DIAGNOSIS — Z79899 Other long term (current) drug therapy: Secondary | ICD-10-CM | POA: Insufficient documentation

## 2017-05-04 DIAGNOSIS — I255 Ischemic cardiomyopathy: Secondary | ICD-10-CM | POA: Diagnosis not present

## 2017-05-04 DIAGNOSIS — I5022 Chronic systolic (congestive) heart failure: Secondary | ICD-10-CM | POA: Diagnosis not present

## 2017-05-04 DIAGNOSIS — E669 Obesity, unspecified: Secondary | ICD-10-CM | POA: Diagnosis not present

## 2017-05-04 NOTE — Progress Notes (Signed)
Patient ID: Bradley Bridges, male    DOB: July 15, 1965, 51 y.o.   MRN: [id number]  HPI  Bradley Bridges is a 51 y/o male with a history of CAD, alcohol use, GERD, HTN, NSTEMI, current tobacco use and chronic heart failure.   Echo report from 02/28/17 reviewed and showed an EF of 20-25% along with mild Bradley/TR. EF in 2009 was 55%. Cardiac catheterization done 04/09/17 showed severe three vessel CAD with no significant change in coronary anatomy since previous catheterization. Well-developed collaterals from the LAD to the right RCA. Mildly elevated left ventricular end-diastolic pressure. Referral considered back to cardiothoracic surgery for CABG.   Admitted 04/06/17 due to NSTEMI. Cardiology consult was obtained. Catheterization done which showed multivessel disease. Initially needed heparin drip and NTG drip. Discharged after 4 days. Admitted 02/27/17 due to chest pain. Was transferred to [facility] for surgical evaluation. Discharged after 3 days.   Patient presents today for Bradley Bridges initial visit with a chief complaint of mild shortness of breath upon moderate exertion. Bradley Bridges describes this as chronic over the last several months with varying levels of severity. Bradley Bridges has associated fatigue, gout pain and difficulty sleeping along with this. Bradley Bridges denies any chest pain, edema, palpitations, dizziness or weight gain.   Past Medical History:  Diagnosis Date  . Coronary artery disease    a. 02/2017 NSTEMI/Cath: LM 25, LAD 25ost, 70p, 36m, 70/90d, D1/2 mod dzs, RI 80, LCX 70m, OM1 small, OM2 mod dzs, RCA 100p, RPDA fills via L->Bridges collats-->Not felt to be a good surgical candidate-->Med Rx; b. 03/2017 NSTEMI/Cath: LM 25, LAD 25ost, 70p, 70m, 70/90d, D1 70ost, D2 80ost, RI 80, LCX 80p->d, OM1 90, RCA 100p, RPDA fills from L->Bridges collats-->Med Rx.  . ETOH abuse   . GERD (gastroesophageal reflux disease)    a. x 20 yrs  . Gout   . HFrEF (heart failure with reduced ejection fraction) (HCC)    a. 02/2017 Echo: EF 20-25%,  diff HK, mild Bradley, mildly dil RA, mild TR.  Marland Kitchen Hypertension    a. x 20 yrs - no meds since 2017.  . Ischemic cardiomyopathy    a. 02/2017 Echo: EF 20-25%, diff HK.  . NSTEMI (non-ST elevated myocardial infarction) (HCC)    a. 02/2017--sev 3VD-->turned down for CABG-->Med Rx.  . Obesity   . Tobacco abuse    Past Surgical History:  Procedure Laterality Date  . LEFT HEART CATH AND CORONARY ANGIOGRAPHY N/A 02/27/2017   Procedure: LEFT HEART CATH AND CORONARY ANGIOGRAPHY;  Surgeon: Yvonne Kendall, MD;  Location: [facility] INVASIVE CV LAB;  Service: Cardiovascular;  Laterality: N/A;  . LEFT HEART CATH AND CORONARY ANGIOGRAPHY N/A 04/09/2017   Procedure: LEFT HEART CATH AND CORONARY ANGIOGRAPHY;  Surgeon: Iran Ouch, MD;  Location: [facility] INVASIVE CV LAB;  Service: Cardiovascular;  Laterality: N/A;   Family History  Problem Relation Age of Onset  . Rheum arthritis Mother   . CAD Father        First MI in Bradley Bridges 64's.  Died in Bradley Bridges 14's  . Other Father        liver failure  . Diabetes Sister   . CAD Brother        s/p stenting   Social History   Tobacco Use  . Smoking status: Current Every Day Smoker    Packs/day: 0.50    Years: 30.00    Pack years: 15.00    Types: Cigarettes  . Smokeless tobacco: Never Used  Substance Use Topics  .  Alcohol use: Yes    Comment: 1/5th of Vodka every weekend- states Bradley Bridges quit 03/15/17   Allergies  Allergen Reactions  . Penicillins Rash    Has patient had a PCN reaction causing immediate rash, facial/tongue/throat swelling, SOB or lightheadedness with hypotension: Yes Has patient had a PCN reaction causing severe rash involving mucus membranes or skin necrosis: No Has patient had a PCN reaction that required hospitalization: No Has patient had a PCN reaction occurring within the last 10 years: No If all of the above answers are "NO", then may proceed with Cephalosporin use.   Prior to Admission medications   Medication Sig Start Date End Date Taking?  Authorizing Provider  acetaminophen (TYLENOL) 500 MG tablet Take 1,000 mg by mouth every 4 (four) hours as needed.   Yes [provider]  aspirin EC 81 MG EC tablet Take 1 tablet (81 mg total) by mouth daily. 03/03/17  Yes Bhagat, Bhavinkumar, PA  atorvastatin (LIPITOR) 80 MG tablet Take 1 tablet (80 mg total) by mouth daily at 6 PM. 03/02/17  Yes Bhagat, Bhavinkumar, PA  carvedilol (COREG) 12.5 MG tablet Take 1 tablet (12.5 mg total) 2 (two) times daily by mouth. 04/10/17  Yes Sudini, Wardell HeathSrikar, MD  clopidogrel (PLAVIX) 75 MG tablet Take 1 tablet (75 mg total) daily by mouth. 04/10/17  Yes Sudini, Wardell HeathSrikar, MD  isosorbide mononitrate (IMDUR) 30 MG 24 hr tablet Take 1 tablet (30 mg total) by mouth daily. 03/03/17  Yes Bhagat, Bhavinkumar, PA  lisinopril (PRINIVIL,ZESTRIL) 5 MG tablet Take 1 tablet (5 mg total) by mouth daily. 03/16/17 06/14/17 Yes Ok AnisBerge, Bradley R, NP  nitroGLYCERIN (NITROSTAT) 0.4 MG SL tablet Place 1 tablet (0.4 mg total) under the tongue every 5 (five) minutes x 3 doses as needed for chest pain. 03/02/17  Yes Bhagat, Bhavinkumar, PA  omeprazole (PRILOSEC OTC) 20 MG tablet Take 20 mg daily by mouth.    Yes [provider]  spironolactone (ALDACTONE) 25 MG tablet Take 1 tablet (25 mg total) by mouth daily. 04/17/17 04/17/18 Yes Ok AnisBerge, Bradley R, NP   Review of Systems  Constitutional: Positive for fatigue. Negative for appetite change.  HENT: Positive for postnasal drip and sore throat. Negative for congestion.   Eyes: Negative.   Respiratory: Positive for shortness of breath (with heavy exertion). Negative for cough and chest tightness.   Cardiovascular: Negative for chest pain, palpitations and leg swelling.  Gastrointestinal: Negative for abdominal distention and abdominal pain.  Endocrine: Negative.   Genitourinary: Negative.   Musculoskeletal: Positive for arthralgias (gout in hands/fingers). Negative for back pain.  Skin: Negative.    Allergic/Immunologic: Negative.   Neurological: Negative for dizziness and light-headedness.  Hematological: Negative for adenopathy. Does not bruise/bleed easily.  Psychiatric/Behavioral: Positive for sleep disturbance (somedays sleep well and sometimes not). Negative for dysphoric mood. The patient is not nervous/anxious.    Vitals:   05/04/17 1257  BP: 137/77  Pulse: 80  Resp: 18  SpO2: 99%  Weight: 238 lb 2 oz (108 kg)  Height: 6\' 1"  (1.854 m)   Wt Readings from Last 3 Encounters:  05/04/17 238 lb 2 oz (108 kg)  04/17/17 233 lb 4 oz (105.8 kg)  04/10/17 229 lb 1.6 oz (103.9 kg)   Lab Results  Component Value Date   CREATININE 1.20 04/25/2017   CREATININE 1.09 04/10/2017   CREATININE 1.27 (H) 04/09/2017    Physical Exam  Constitutional: Bradley Bridges is oriented to person, place, and time. Bradley Bridges appears well-developed and well-nourished.  HENT:  Head:  Normocephalic and atraumatic.  Neck: Normal range of motion. Neck supple. No JVD present.  Cardiovascular: Normal rate and regular rhythm.  Pulmonary/Chest: Effort normal. Bradley Bridges has no wheezes. Bradley Bridges has no rales.  Abdominal: Soft. Bradley Bridges exhibits no distension. There is no tenderness.  Musculoskeletal: Bradley Bridges exhibits no edema or tenderness.  Neurological: Bradley Bridges is alert and oriented to person, place, and time.  Skin: Skin is warm and dry.  Psychiatric: Bradley Bridges has a normal mood and affect. Bradley Bridges behavior is normal. Thought content normal.  Nursing note and vitals reviewed.  Assessment & Plan:  1: Chronic heart failure with reduced ejection fraction- - NYHA class II - euvolemic today - weighing daily. Instructed to call for an overnight weight gain of >2 pounds or a weekly weight gain of >5 pounds - does add salt to eggs, tomatoes and potatoes. Discussed the importance of not adding any salt to Bradley Bridges food and closely following a 2000mg  sodium diet. Reviewed how to read food labels and written dietary information was given to him about this.  - drinking 2  cups coffee, 3-4 glasses of [facility] and some water daily - sees cardiology Brion Aliment(Bradley Bridges) 05/21/17 - discussed titrating up carvedilol in the future as well as changing Bradley Bridges lisinopril to entresto in the future  2: HTN- - BP looks good today - planning on getting established at [facility] - BMP from 04/25/17 reviewed and shows sodium 137, potassium 4.0 and GFR >60  3: CAD- - has appointment on 05/30/16 with cardiothoracic surgeon regarding possible CABG (Dr. Laneta SimmersBartle)  4: Tobacco use- - currently smoking ~ 1ppd of cigarettes - had stopped 3 times previously - complete cessation discussed for 3 minutes with him  Medication list was reviewed.  Return in 2 months or sooner for any questions/problems before then.

## 2017-05-04 NOTE — Patient Instructions (Addendum)
Continue weighing daily and call for an overnight weight gain of > 2 pounds or a weekly weight gain of >5 pounds.    Smoking Cessation Quitting smoking is important to your health and has many advantages. However, it is not always easy to quit since nicotine is a very addictive drug. Oftentimes, people try 3 times or more before being able to quit. This document explains the best ways for you to prepare to quit smoking. Quitting takes hard work and a lot of effort, but you can do it. ADVANTAGES OF QUITTING SMOKING  You will live longer, feel better, and live better.  Your body will feel the impact of quitting smoking almost immediately.  Within 20 minutes, blood pressure decreases. Your pulse returns to its normal level.  After 8 hours, carbon monoxide levels in the blood return to normal. Your oxygen level increases.  After 24 hours, the chance of having a heart attack starts to decrease. Your breath, hair, and body stop smelling like smoke.  After 48 hours, damaged nerve endings begin to recover. Your sense of taste and smell improve.  After 72 hours, the body is virtually free of nicotine. Your bronchial tubes relax and breathing becomes easier.  After 2 to 12 weeks, lungs can hold more air. Exercise becomes easier and circulation improves.  The risk of having a heart attack, stroke, cancer, or lung disease is greatly reduced.  After 1 year, the risk of coronary heart disease is cut in half.  After 5 years, the risk of stroke falls to the same as a nonsmoker.  After 10 years, the risk of lung cancer is cut in half and the risk of other cancers decreases significantly.  After 15 years, the risk of coronary heart disease drops, usually to the level of a nonsmoker.  If you are pregnant, quitting smoking will improve your chances of having a healthy baby.  The people you live with, especially any children, will be healthier.  You will have extra money to spend on things other  than cigarettes. QUESTIONS TO THINK ABOUT BEFORE ATTEMPTING TO QUIT You may want to talk about your answers with your health care provider.  Why do you want to quit?  If you tried to quit in the past, what helped and what did not?  What will be the most difficult situations for you after you quit? How will you plan to handle them?  Who can help you through the tough times? Your family? Friends? A health care provider?  What pleasures do you get from smoking? What ways can you still get pleasure if you quit? Here are some questions to ask your health care provider:  How can you help me to be successful at quitting?  What medicine do you think would be best for me and how should I take it?  What should I do if I need more help?  What is smoking withdrawal like? How can I get information on withdrawal? GET READY  Set a quit date.  Change your environment by getting rid of all cigarettes, ashtrays, matches, and lighters in your home, car, or work. Do not let people smoke in your home.  Review your past attempts to quit. Think about what worked and what did not. GET SUPPORT AND ENCOURAGEMENT You have a better chance of being successful if you have help. You can get support in many ways.  Tell your family, friends, and coworkers that you are going to quit and need their support. Ask   them not to smoke around you.  Get individual, group, or telephone counseling and support. Programs are available at local hospitals and health centers. Call your local health department for information about programs in your area.  Spiritual beliefs and practices may help some smokers quit.  Download a "quit meter" on your computer to keep track of quit statistics, such as how long you have gone without smoking, cigarettes not smoked, and money saved.  Get a self-help book about quitting smoking and staying off tobacco. LEARN NEW SKILLS AND BEHAVIORS  Distract yourself from urges to smoke. Talk to  someone, go for a walk, or occupy your time with a task.  Change your normal routine. Take a different route to work. Drink tea instead of coffee. Eat breakfast in a different place.  Reduce your stress. Take a hot bath, exercise, or read a book.  Plan something enjoyable to do every day. Reward yourself for not smoking.  Explore interactive web-based programs that specialize in helping you quit. GET MEDICINE AND USE IT CORRECTLY Medicines can help you stop smoking and decrease the urge to smoke. Combining medicine with the above behavioral methods and support can greatly increase your chances of successfully quitting smoking.  Nicotine replacement therapy helps deliver nicotine to your body without the negative effects and risks of smoking. Nicotine replacement therapy includes nicotine gum, lozenges, inhalers, nasal sprays, and skin patches. Some may be available over-the-counter and others require a prescription.  Antidepressant medicine helps people abstain from smoking, but how this works is unknown. This medicine is available by prescription.  Nicotinic receptor partial agonist medicine simulates the effect of nicotine in your brain. This medicine is available by prescription. Ask your health care provider for advice about which medicines to use and how to use them based on your health history. Your health care provider will tell you what side effects to look out for if you choose to be on a medicine or therapy. Carefully read the information on the package. Do not use any other product containing nicotine while using a nicotine replacement product.  RELAPSE OR DIFFICULT SITUATIONS Most relapses occur within the first 3 months after quitting. Do not be discouraged if you start smoking again. Remember, most people try several times before finally quitting. You may have symptoms of withdrawal because your body is used to nicotine. You may crave cigarettes, be irritable, feel very hungry, cough  often, get headaches, or have difficulty concentrating. The withdrawal symptoms are only temporary. They are strongest when you first quit, but they will go away within 10-14 days. To reduce the chances of relapse, try to:  Avoid drinking alcohol. Drinking lowers your chances of successfully quitting.  Reduce the amount of caffeine you consume. Once you quit smoking, the amount of caffeine in your body increases and can give you symptoms, such as a rapid heartbeat, sweating, and anxiety.  Avoid smokers because they can make you want to smoke.  Do not let weight gain distract you. Many smokers will gain weight when they quit, usually less than 10 pounds. Eat a healthy diet and stay active. You can always lose the weight gained after you quit.  Find ways to improve your mood other than smoking. FOR MORE INFORMATION  www.smokefree.gov  Document Released: 05/02/2001 Document Revised: 09/22/2013 Document Reviewed: 08/17/2011 ExitCare Patient Information 2015 [employer]. This information is not intended to replace advice given to you by your health care provider. Make sure you discuss any questions you have with your   health care provider.  

## 2017-05-07 ENCOUNTER — Telehealth: Payer: Self-pay | Admitting: Nurse Practitioner

## 2017-05-07 ENCOUNTER — Ambulatory Visit: Payer: Self-pay | Admitting: Pharmacy Technician

## 2017-05-07 ENCOUNTER — Other Ambulatory Visit: Payer: Self-pay | Admitting: *Deleted

## 2017-05-07 DIAGNOSIS — Z79899 Other long term (current) drug therapy: Secondary | ICD-10-CM

## 2017-05-07 MED ORDER — CLOPIDOGREL BISULFATE 75 MG PO TABS: 75.0000 mg | ORAL_TABLET | Freq: Every day | ORAL | 1 refills | Status: DC

## 2017-05-07 NOTE — Telephone Encounter (Signed)
Patient has called and stated [facility] no longer [facility] but instead needs Plavix 75 MG sent to [facility]  Fax [phone]

## 2017-05-07 NOTE — Telephone Encounter (Signed)
°*  STAT* If patient is at the pharmacy, call can be transferred to refill team.   1. Which medications need to be refilled? (please list name of each medication and dose if known) Plavix 75 mg po daily   2. Which pharmacy/location (including street and city if local pharmacy) is medication to be sent to? [facility] [place]   3. Do they need a 30 day or 90 day supply? 30

## 2017-05-07 NOTE — Telephone Encounter (Signed)
Pl[facility] 75 mg tablet [facility].

## 2017-05-07 NOTE — Telephone Encounter (Signed)
Plavix sent to [facility].

## 2017-05-08 NOTE — Progress Notes (Signed)
Met with patient completed financial assistance application for [facility] due to recent hospital visit.  Patient agreed to be responsible for gathering financial information and forwarding to appropriate department in [facility].    Completed [facility] application and contract.  Patient agreed to all terms of the [facility] contract.    Patient approved to receive medication assistance at [facility] thorugh 2018, as long as eligibility criteria continues to be met.    Provided patient with Civil engineer, contracting based on his particular needs.    [facility]

## 2017-05-10 ENCOUNTER — Other Ambulatory Visit: Payer: Self-pay | Admitting: Family

## 2017-05-10 MED ORDER — ATORVASTATIN CALCIUM 80 MG PO TABS: 80.0000 mg | ORAL_TABLET | Freq: Every day | ORAL | 3 refills | Status: DC

## 2017-05-10 MED ORDER — ISOSORBIDE MONONITRATE ER 30 MG PO TB24: 30.0000 mg | ORAL_TABLET | Freq: Every day | ORAL | 3 refills | Status: DC

## 2017-05-10 MED ORDER — SPIRONOLACTONE 25 MG PO TABS: 25.0000 mg | ORAL_TABLET | Freq: Every day | ORAL | 3 refills | Status: DC

## 2017-05-10 MED ORDER — CARVEDILOL 12.5 MG PO TABS: 12.5000 mg | ORAL_TABLET | Freq: Two times a day (BID) | ORAL | 3 refills | Status: DC

## 2017-05-10 MED ORDER — ASPIRIN 81 MG PO TBEC: 81.0000 mg | DELAYED_RELEASE_TABLET | Freq: Every day | ORAL | 3 refills | Status: AC

## 2017-05-10 MED ORDER — LISINOPRIL 5 MG PO TABS: 5.0000 mg | ORAL_TABLET | Freq: Every day | ORAL | 3 refills | Status: DC

## 2017-05-10 MED ORDER — CLOPIDOGREL BISULFATE 75 MG PO TABS: 75.0000 mg | ORAL_TABLET | Freq: Every day | ORAL | 3 refills | Status: DC

## 2017-05-10 MED ORDER — OMEPRAZOLE MAGNESIUM 20 MG PO TBEC: 20.0000 mg | DELAYED_RELEASE_TABLET | Freq: Every day | ORAL | 3 refills | Status: DC

## 2017-05-10 MED ORDER — NITROGLYCERIN 0.4 MG SL SUBL: 0.4000 mg | SUBLINGUAL_TABLET | SUBLINGUAL | 3 refills | Status: AC | PRN

## 2017-05-21 ENCOUNTER — Ambulatory Visit (INDEPENDENT_AMBULATORY_CARE_PROVIDER_SITE_OTHER): Payer: Self-pay | Admitting: Nurse Practitioner

## 2017-05-21 ENCOUNTER — Encounter: Payer: Self-pay | Admitting: Nurse Practitioner

## 2017-05-21 VITALS — BP 130/82 | HR 78 | Ht 73.0 in | Wt 240.2 lb

## 2017-05-21 DIAGNOSIS — I5023 Acute on chronic systolic (congestive) heart failure: Secondary | ICD-10-CM

## 2017-05-21 DIAGNOSIS — I110 Hypertensive heart disease with heart failure: Secondary | ICD-10-CM

## 2017-05-21 DIAGNOSIS — I2510 Atherosclerotic heart disease of native coronary artery without angina pectoris: Secondary | ICD-10-CM

## 2017-05-21 DIAGNOSIS — I255 Ischemic cardiomyopathy: Secondary | ICD-10-CM

## 2017-05-21 DIAGNOSIS — E782 Mixed hyperlipidemia: Secondary | ICD-10-CM

## 2017-05-21 MED ORDER — FUROSEMIDE 20 MG PO TABS: 20.0000 mg | ORAL_TABLET | Freq: Every day | ORAL | 3 refills | Status: DC

## 2017-05-21 NOTE — Progress Notes (Signed)
Office Visit    Patient Name: Bradley Bridges Date of Encounter: 05/21/2017  Primary Care Provider:  Patient, [name] Per Primary Cardiologist:  Yvonne Kendallhristopher End, MD  Chief Complaint    51 y/o ? with a history of alcohol abuse, tobacco abuse, hypertension, obesity, CAD, and ischemic cardiomyopathy, who presents for follow-up.  Past Medical History    Past Medical History:  Diagnosis Date  . Coronary artery disease    a. 02/2017 NSTEMI/Cath: LM 25, LAD 25ost, 70p, 2187m, 70/90d, D1/2 mod dzs, RI 80, LCX 8166m, OM1 small, OM2 mod dzs, RCA 100p, RPDA fills via L->R collats-->Not felt to be a good surgical candidate-->Med Rx; b. 03/2017 NSTEMI/Cath: LM 25, LAD 25ost, 70p, 5787m, 70/90d, D1 70ost, D2 80ost, RI 80, LCX 80p->d, OM1 90, RCA 100p, RPDA fills from L->R collats-->Med Rx.  . ETOH abuse   . GERD (gastroesophageal reflux disease)    a. x 20 yrs  . Gout   . HFrEF (heart failure with reduced ejection fraction) (HCC)    a. 02/2017 Echo: EF 20-25%, diff HK, mild MR, mildly dil RA, mild TR.  Marland Kitchen. Hypertension    a. x 20 yrs - no meds since 2017.  . Ischemic cardiomyopathy    a. 02/2017 Echo: EF 20-25%, diff HK.  . NSTEMI (non-ST elevated myocardial infarction) (HCC)    a. 02/2017--sev 3VD-->turned down for CABG-->Med Rx.  . Obesity   . Tobacco abuse    Past Surgical History:  Procedure Laterality Date  . LEFT HEART CATH AND CORONARY ANGIOGRAPHY N/A 02/27/2017   Procedure: LEFT HEART CATH AND CORONARY ANGIOGRAPHY;  Surgeon: Yvonne KendallEnd, Dannie Hattabaugh, MD;  Location: [facility] INVASIVE CV LAB;  Service: Cardiovascular;  Laterality: N/A;  . LEFT HEART CATH AND CORONARY ANGIOGRAPHY N/A 04/09/2017   Procedure: LEFT HEART CATH AND CORONARY ANGIOGRAPHY;  Surgeon: Iran OuchArida, Muhammad A, MD;  Location: [facility] INVASIVE CV LAB;  Service: Cardiovascular;  Laterality: N/A;    Allergies  Allergies  Allergen Reactions  . Penicillins Rash    Has patient had a PCN reaction causing immediate rash,  facial/tongue/throat swelling, SOB or lightheadedness with hypotension: Yes Has patient had a PCN reaction causing severe rash involving mucus membranes or skin necrosis: No Has patient had a PCN reaction that required hospitalization: No Has patient had a PCN reaction occurring within the last 10 years: No If all of the above answers are "NO", then may proceed with Cephalosporin use.    History of Present Illness    51 y/o ? with the above complex past medical history including hypertension, obesity, tobacco abuse, and alcohol abuse.  In October 2018, he was admitted to [facility] with a late presenting STEMI.  Catheterization revealed significant multivessel disease and he was transferred to [facility] surgical evaluation, but was felt to be a poor candidate secondary tobacco abuse and LV dysfunction (EF 20-25%).  Medical therapy was recommended.  Unfortunately, he was readmitted to [facility] on November 16 with non-STEMI and peak troponin of 10.27.  Repeat catheterization revealed no significant change in coronary anatomy since prior catheterization though collaterals from the LAD to the distal RCA were felt to be more well-developed.  Again, medical therapy was recommended and he was set up to follow-up with thoracic surgery.  When he was last seen in clinic on November 27, he was feeling relatively well.  He was supposed to see thoracic surgery on December 14 but he had to reschedule and he is now pending follow-up visit  January 9.  Since his last visit, he continues to experience dyspnea on exertion.  He exerts himself very little however.  He has also put on an additional 7 pounds since I last saw him in clinic.  With this, he has had increase in abdominal bloating.  He denies chest pain, palpitations, PND, orthopnea, dizziness, syncope, edema, or early satiety.  He reports compliance with medications.  He continues to smoke.  Home Medications    Prior to Admission  medications   Medication Sig Start Date End Date Taking? Authorizing Provider  acetaminophen (TYLENOL) 500 MG tablet Take 1,000 mg by mouth every 4 (four) hours as needed.   Yes [provider]  aspirin 81 MG EC tablet Take 1 tablet (81 mg total) by mouth daily. 05/10/17  Yes Clarisa KindredHackney, Tina A, FNP  atorvastatin (LIPITOR) 80 MG tablet Take 1 tablet (80 mg total) by mouth daily at 6 PM. 05/10/17  Yes Clarisa KindredHackney, Tina A, FNP  carvedilol (COREG) 12.5 MG tablet Take 1 tablet (12.5 mg total) by mouth 2 (two) times daily. 05/10/17  Yes Clarisa KindredHackney, Tina A, FNP  clopidogrel (PLAVIX) 75 MG tablet Take 1 tablet (75 mg total) by mouth daily. 05/10/17  Yes Clarisa KindredHackney, Tina A, FNP  isosorbide mononitrate (IMDUR) 30 MG 24 hr tablet Take 1 tablet (30 mg total) by mouth daily. 05/10/17  Yes Hackney, [name] A, FNP  lisinopril (PRINIVIL,ZESTRIL) 5 MG tablet Take 1 tablet (5 mg total) by mouth daily. 05/10/17 08/08/17 Yes Hackney, [name] A, FNP  nitroGLYCERIN (NITROSTAT) 0.4 MG SL tablet Place 1 tablet (0.4 mg total) under the tongue every 5 (five) minutes x 3 doses as needed for chest pain. 05/10/17  Yes Clarisa KindredHackney, Tina A, FNP  omeprazole (PRILOSEC OTC) 20 MG tablet Take 1 tablet (20 mg total) by mouth daily. 05/10/17  Yes Clarisa KindredHackney, Tina A, FNP  spironolactone (ALDACTONE) 25 MG tablet Take 1 tablet (25 mg total) by mouth daily. 05/10/17 05/10/18 Yes Delma FreezeHackney, Tina A, FNP    Review of Systems    Increased abdominal bloating and slightly progressive dyspnea on exertion.  Weight is up from 229 after his initial visit in October to 240 pounds today.  All other systems reviewed and are otherwise negative except as noted above.  Physical Exam    VS:  BP 130/82 (BP Location: Left Arm, Patient Position: Sitting, Cuff Size: Normal)   Pulse 78   Ht 6\' 1"  (1.854 m)   Wt 240 lb 4 oz (109 kg)   BMI 31.70 kg/m  , BMI Body mass index is 31.7 kg/m. GEN: Well nourished, well developed, in no acute distress.  HEENT: normal.  Neck:  Supple, no JVD, carotid bruits, or masses. Cardiac: RRR, no murmurs, rubs, or gallops. No clubbing, cyanosis, edema.  Radials/DP/PT 2+ and equal bilaterally.  Respiratory:  Respirations regular and unlabored, clear to auscultation bilaterally. GI: Semifirm and protuberant, nontender, BS + x 4. MS: no deformity or atrophy. Skin: warm and dry, no rash. Neuro:  Strength and sensation are intact. Psych: Normal affect.  Accessory Clinical Findings    ECG -regular sinus rhythm, 78, left axis deviation, inferior infarct with inferior and anterolateral T wave inversions.  No acute changes.  Assessment & Plan    1.  Coronary artery disease: Admitted in October with late presenting STEMI and found to have multivessel CAD.  Not felt to be a surgical candidate based on LV dysfunction, tobacco abuse, diffuse and small vessel nature of disease, and poor distal targets.  Recurrent  non-STEMI in late November with catheterization again showing multivessel disease and better developed collaterals.  Recommendation at that time was made for follow-up with CT surgery to reconsider candidacy for CABG.  The patient has not been having any chest pain over the past month.  He reports compliance with medications but unfortunately does continue to smoke.  He will follow-up with CT surgery in January.  He remains on aspirin, statin, beta-blocker, Plavix, nitrate, and ACE inhibitor therapy.  2.  Ischemic cardiomyopathy/Acute on chronic systolic CHF: EF 16-10% with diffuse hypokinesis by echo in October 2018.  His weight is up 7 pounds since I last saw him in 2 pounds since he was seen by heart failure clinic earlier this month.  With this, he has noted progressive dyspnea on exertion and also increasing abdominal girth.  We discussed the importance of daily weights, sodium restriction, medication compliance, and symptom reporting and he verbalizes understanding.  I am adding Lasix 20 mg daily to his current regimen.  Plan to  follow-up a basic metabolic panel in 1 week.  He has follow-up in heart failure clinic on February 1.  Otherwise continue beta-blocker, ACE inhibitor, and spironolactone.  We had previously considered Entresto but at that time, he was not even able to afford losartan.  Pending CT surgical evaluation, we will need to decide on timing of follow-up echocardiogram to reevaluate LV function and determine candidacy for ICD therapy.  3.  Tobacco abuse: He continues to smoke.  He rolls his own and says he cannot afford patches.  Complete cessation advised.  He understands that ongoing tobacco abuse is likely to impact his candidacy for high risk surgery.  4.  Alcohol abuse: He has not had a drink since his initial myocardial infarction in October.  5.  Hyperlipidemia: LDL was 108 in October.  Continue high potency statin therapy.  Plan to follow-up lipids and LFTs and will recheck labs next week.  6.  Hypertensive heart disease: Blood pressure stable on current regimen.  7.  Disposition: Follow-up basic metabolic panel, LFTs, and lipids next week.  Follow-up in heart failure clinic in 1 month as scheduled.  Follow-up here in 2-3 months.   Nicolasa Ducking, NP 05/21/2017, 11:01 AM

## 2017-05-21 NOTE — Patient Instructions (Addendum)
Medication Instructions: - Your physician has recommended you make the following change in your medication:  1) START lasix (fursemide) 20 mg- take 1 tablet by mouth once daily  Labwork: - Your physician recommends that you return for lab work in: 1 week- CMET/ direct LDL (1/7-1/[address] [facility] entrance- 1st desk on the right)  Procedures/Testing: - none ordered  Follow-Up: - Your physician recommends that you schedule a follow-up appointment in: 2 months with Dr. Okey DupreEnd.    Any Additional Special Instructions Will Be Listed Below (If Applicable).     If you need a refill on your cardiac medications before your next appointment, please call your pharmacy.

## 2017-05-24 ENCOUNTER — Telehealth: Payer: Self-pay | Admitting: Internal Medicine

## 2017-05-24 ENCOUNTER — Telehealth: Payer: Self-pay

## 2017-05-24 NOTE — Telephone Encounter (Signed)
I spoke with the patient. He states that he took a dose of lasix 12/31 & 1/1 and felt more SOB and dizzy after that. He has been weighing daily per his report: (baseline weight is 232-233 lbs).  12/31- 240 lbs in the office 1/2- 237 lbs 1/3- 235.5 lbs  He states he did not take his lasix yesterday/ today and he feels like his breathing has improved. I advised the patient that he should not be more SOB on lasix with his weight trending down, but he could have some dizziness with his fluid status dropping. He has not checked his BP at home due to the batteries in his cuff not working.  Reviewed with Ward Givenshris Berge, NP- per Thayer Ohmhris, increased SOB should not be coming from the lasix- he advised the patient try this again if agreeable.  I discussed Thayer OhmChris' recommendations with the patient.  Per the patient, he is not sure he wants to try lasix again. I have advised him to weigh in the morning and if his weight is not down to his baseline of 232-233 lbs, then he should take a dose of lasix 20 mg tomorrow.  If his weight is back at baseline, I have advised he may hold lasix tomorrow.  We discussed using lasix PRN if he does not want to take this daily- he is aware to take a dose if his weight is up 3 lbs or more in 24 hours or 5 lbs in a week.  The patient states he will check his weight tomorrow and decide if he will take his lasix. I have advised him to keep his lab appointment next week and his follow up with the cardiac surgeon. He voices understanding.

## 2017-05-24 NOTE — Telephone Encounter (Signed)
Pt c/o medication issue:  1. Name of Medication: Lasix 20 mg   2. How are you currently taking this medication (dosage and times per day)? 20 mg po daily    3. Are you having a reaction (difficulty breathing--STAT)? Sob lightheaded   4. What is your medication issue? Cannot tolerate Lasix he feels week sob lightheaded

## 2017-05-24 NOTE — Telephone Encounter (Signed)
Tried to call patient to set up a New Patient Appointment referred from [facility] Bradley Bridges.

## 2017-05-29 ENCOUNTER — Telehealth: Payer: Self-pay | Admitting: *Deleted

## 2017-05-29 NOTE — Telephone Encounter (Signed)
Received paperwork from [facility] for Dr End to sign Rx for Nitroglycerin.  Dr End signed necessary documents and staff message sent to Bradley Bridges to let her know paperwork is at front desk for pick up at her earliest convenience.

## 2017-05-30 ENCOUNTER — Other Ambulatory Visit: Payer: Self-pay

## 2017-05-30 ENCOUNTER — Ambulatory Visit (INDEPENDENT_AMBULATORY_CARE_PROVIDER_SITE_OTHER): Payer: Self-pay | Admitting: Surgery

## 2017-05-30 ENCOUNTER — Encounter: Payer: Self-pay | Admitting: Surgery

## 2017-05-30 VITALS — BP 130/86 | HR 83 | Resp 20 | Ht 73.0 in | Wt 241.0 lb

## 2017-05-30 DIAGNOSIS — I2510 Atherosclerotic heart disease of native coronary artery without angina pectoris: Secondary | ICD-10-CM

## 2017-05-31 ENCOUNTER — Encounter: Payer: Self-pay | Admitting: Surgery

## 2017-05-31 NOTE — Progress Notes (Signed)
HPI:  The patient is a 52 year old gentleman with a history of ongoing heavy smoking, hypertension, gout, and a family history of premature coronary artery disease who presented in October with waxing and waning pressure across the chest and upper abdomen associated with shortness of breath of several days duration. He developed some radiation to left arm and presented to [facility] ER on 10/7. ECG showed diffuse ST/T changes with an initial troponin of 13.83 that peaked at 16. His chest pain resolved and he underwent cath showing severe diffuse 3-vessel CAD with the culprit appearing to be a completely occluded proximal RCA.  The distal vessel was barely visible over a short segment by collaterals.  The LAD was diffusely diseased to the apex with multiple tight stenoses down the vessel.  The marginal branches were small and diffusely diseased and I do not feel that they were graftable.  Elevated LVEDP of 25 mm Hg. LV gram was not done and echo showed an EF of 20-25% with diffuse hypokinesis and mild MR. He was transferred to [facility] for surgical evaluation.  He was evaluated by me and I did not feel that he was a surgical candidate due to his poor distal vessels combined with low ejection fraction.  He was treated with medical therapy and readmitted to [facility] on November 16 with a non-ST segment elevation MI with peak troponin of 10.27.  Repeat catheterization was performed and there was no change in his anatomy although there was slightly improved collaterals to the distal RCA.  He has been seen in follow-up by cardiology and has been referred back to me for reconsideration of coronary artery bypass graft surgery.  He tells me that he has not had any further chest discomfort.  He has had some dyspnea on exertion but has not been very active.  He continues to smoke about 1 pack of cigarettes per day.  He said that he no longer drinks any alcohol and does not use any drugs.    Current Outpatient  Medications  Medication Sig Dispense Refill  . acetaminophen (TYLENOL) 500 MG tablet Take 1,000 mg by mouth every 4 (four) hours as needed.    Marland Kitchen aspirin 81 MG EC tablet Take 1 tablet (81 mg total) by mouth daily. 90 tablet 3  . atorvastatin (LIPITOR) 80 MG tablet Take 1 tablet (80 mg total) by mouth daily at 6 PM. 90 tablet 3  . carvedilol (COREG) 12.5 MG tablet Take 1 tablet (12.5 mg total) by mouth 2 (two) times daily. 180 tablet 3  . clopidogrel (PLAVIX) 75 MG tablet Take 1 tablet (75 mg total) by mouth daily. 90 tablet 3  . furosemide (LASIX) 20 MG tablet Take 1 tablet (20 mg total) by mouth daily. 30 tablet 3  . isosorbide mononitrate (IMDUR) 30 MG 24 hr tablet Take 1 tablet (30 mg total) by mouth daily. 90 tablet 3  . lisinopril (PRINIVIL,ZESTRIL) 5 MG tablet Take 1 tablet (5 mg total) by mouth daily. 90 tablet 3  . nitroGLYCERIN (NITROSTAT) 0.4 MG SL tablet Place 1 tablet (0.4 mg total) under the tongue every 5 (five) minutes x 3 doses as needed for chest pain. 25 tablet 3  . omeprazole (PRILOSEC OTC) 20 MG tablet Take 1 tablet (20 mg total) by mouth daily. 90 tablet 3  . spironolactone (ALDACTONE) 25 MG tablet Take 1 tablet (25 mg total) by mouth daily. 90 tablet 3   No current facility-administered medications for this visit.  Physical Exam: BP 130/86 (BP Location: Left Arm, Patient Position: Sitting, Cuff Size: Large)   Pulse 83   Resp 20   Ht 6\' 1"  (1.854 m)   Wt 241 lb (109.3 kg)   SpO2 98% Comment: RA  BMI 31.80 kg/m  Chronically ill-appearing gentleman in no distress. Cardiac exam shows a regular rate and rhythm with this.  There is no murmur. Lung exam reveals clear breath sounds. There is no peripheral edema.  Diagnostic Tests:  Physicians   Panel Physicians Referring Physician Case Authorizing Physician  Iran Ouch, MD (Primary)  Sondra Barges, PA-C  Procedures   LEFT HEART CATH AND CORONARY ANGIOGRAPHY  Conclusion     LM lesion is 25%  stenosed.  Ost LAD lesion is 25% stenosed.  Prox LAD lesion is 70% stenosed.  Mid LAD lesion is 80% stenosed.  Dist LAD-1 lesion is 70% stenosed.  Dist LAD-2 lesion is 90% stenosed.  Ramus lesion is 80% stenosed.  Prox RCA lesion is 100% stenosed.  Ost 1st Mrg to 1st Mrg lesion is 90% stenosed.  Prox Cx to Dist Cx lesion is 80% stenosed.  Ost 1st Diag to 1st Diag lesion is 70% stenosed.  Ost 2nd Diag to 2nd Diag lesion is 80% stenosed.   1.  Severe three-vessel coronary artery disease with no significant change in coronary anatomy since most recent cardiac catheterization.  There are now well-developed collaterals from the LAD to the right coronary artery which seems to be a relatively large sized vessel distally. 2.  Left ventricular angiography was not performed.  Mildly elevated left ventricular end-diastolic pressure.  Recommendations: Continue medical therapy.  Consider referral back to cardiothoracic surgery for reconsideration of CABG given that the distal right coronary artery appears to be a good target and there are reasonable targets in the diagonals.  The LAD is not an optimal target but probably graftable.   Indications   Non-ST elevation (NSTEMI) myocardial infarction (HCC) [I21.4 (ICD-10-CM)]  Procedural Details/Technique   Technical Details Procedural Details: The right wrist was prepped, draped, and anesthetized with 1% lidocaine. Using the modified Seldinger technique, a 5 French sheath was introduced into the right radial artery. 2.5 mg of verapamil was administered through the sheath, weight-based unfractionated heparin was administered intravenously. A Jackie catheter was used for selective coronary angiography. A pigtail catheter was used for LV pressure. Catheter exchanges were performed over an exchange length guidewire. There were no immediate procedural complications. A TR band was used for radial hemostasis at the completion of the procedure. The  patient was transferred to the post catheterization recovery area for further monitoring.   Estimated blood loss <50 mL.  During this procedure the patient was administered the following to achieve and maintain moderate conscious sedation: Versed 1 mg, Fentanyl 50 mcg, while the patient's heart rate, blood pressure, and oxygen saturation were continuously monitored. The period of conscious sedation was 26 minutes, of which I was present face-to-face 100% of this time.  Coronary Findings   Diagnostic  Dominance: Right  Left Main  Vessel is large.  LM lesion 25% stenosed  LM lesion is 25% stenosed.  Left Anterior Descending  Vessel is large.  Ost LAD lesion 25% stenosed  Ost LAD lesion is 25% stenosed.  Prox LAD lesion 70% stenosed  Prox LAD lesion is 70% stenosed.  Mid LAD lesion 80% stenosed  Mid LAD lesion is 80% stenosed.  Dist LAD-1 lesion 70% stenosed  Dist LAD-1 lesion is 70% stenosed.  Dist LAD-2  lesion 90% stenosed  Dist LAD-2 lesion is 90% stenosed.  First Diagonal Branch  Vessel is moderate in size. Vessel is angiographically normal.  Ost 1st Diag to 1st Diag lesion 70% stenosed  Ost 1st Diag to 1st Diag lesion is 70% stenosed.  Second Diagonal Branch  Vessel is moderate in size. Vessel is angiographically normal.  Ost 2nd Diag to 2nd Diag lesion 80% stenosed  Ost 2nd Diag to 2nd Diag lesion is 80% stenosed.  Ramus Intermedius  Vessel is moderate in size.  Ramus lesion 80% stenosed  Ramus lesion is 80% stenosed.  Left Circumflex  Vessel is moderate in size.  Prox Cx to Dist Cx lesion 80% stenosed  Prox Cx to Dist Cx lesion is 80% stenosed.  First Obtuse Marginal Branch  Vessel is small in size.  Ost 1st Mrg to 1st Mrg lesion 90% stenosed  Ost 1st Mrg to 1st Mrg lesion is 90% stenosed.  Second Obtuse Marginal Branch  Vessel is moderate in size. There is moderate disease in the vessel.  Lateral Second Obtuse Marginal Branch  There is moderate disease in the  vessel.  Right Coronary Artery  Prox RCA lesion 100% stenosed  Prox RCA lesion is 100% stenosed.  Right Posterior Descending Artery  Collaterals  RPDA filled by collaterals from 2nd Sept.    Intervention   No interventions have been documented.  Coronary Diagrams   Diagnostic Diagram       Implants     No implant documentation for this case.  MERGE Images   Show images for CARDIAC CATHETERIZATION   Link to Procedure Log   Procedure Log    Hemo Data 04/09/17 0846--04/09/17 0929 before discharge   AO Systolic Cath Pressure AO Diastolic Cath Pressure AO Mean Cath Pressure LV Systolic Cath Pressure LV End Diastolic  134 84 mmHg 99 mmHg - -  139 84 mmHg 106 mmHg - -  - - - 132 mmHg 15 mmHg  - - - 129 mmHg 14 mmHg  - - - 135 mmHg 19 mmHg  136 78 mmHg 99 mmHg -     Result status: Final result                              *[place]*                  *[facility]*                          [address]                        [place], [place] [address]                            [phone]  ------------------------------------------------------------------- Transthoracic Echocardiography  Patient:    Kenyata, Guess MR #:       [id number] Study Date: 02/28/2017 Gender:     M Age:        51 Height:     185.4 cm Weight:     110.2 kg BSA:        2.41 m^2 Pt. Status: Room:       4E17C   PERFORMING   [facility], Inpatient  ADMITTING    Chilton Si, MD  ATTENDING    Chilton Si, MD  ORDERING  Arty Baumgartner  REFERRING    Laverda Page B  SONOGRAPHER  Chelsea Androw  cc:  ------------------------------------------------------------------- LV EF: 20% -   25%  ------------------------------------------------------------------- Indications:      MI - acute 410.91.  ------------------------------------------------------------------- History:   PMH:  Hx of alcohol abuse History acquired from prior echo in 2009.   Chest pain.  Syncope.  ------------------------------------------------------------------- Study Conclusions  - Left ventricle: The cavity size was normal. Systolic function was   severely reduced. The estimated ejection fraction was in the   range of 20% to 25%. Diffuse hypokinesis. The study is not   technically sufficient to allow evaluation of LV diastolic   function. Acoustic contrast opacification revealed no evidence   ofthrombus. - Mitral valve: There was mild regurgitation. - Right atrium: The atrium was mildly dilated. - Tricuspid valve: There was mild regurgitation.  Recommendations:  When compared to prior, EF is reduced (prior 55%).  ------------------------------------------------------------------- Study data:  Comparison was made to the study of 07/29/2007.  Study status:  Routine.  Procedure:  The patient reported no pain pre or post test. Transthoracic echocardiography. Image quality was adequate.  Study completion:  There were no complications. Transthoracic echocardiography.  M-mode, complete 2D, spectral Doppler, and color Doppler.  Birthdate:  Patient birthdate: 1966-03-05.  Age:  Patient is 52 yr old.  Sex:  Gender: male. BMI: 32.1 kg/m^2.  Blood pressure:     131/89  Patient status: Inpatient.  Study date:  Study date: 02/28/2017. Study time: 01:08 PM.  Location:  Bedside.  -------------------------------------------------------------------  ------------------------------------------------------------------- Left ventricle:  The cavity size was normal. Systolic function was severely reduced. The estimated ejection fraction was in the range of 20% to 25%. Diffuse hypokinesis.  Acoustic contrast opacification revealed no evidence ofthrombus. The study is not technically sufficient to allow evaluation of LV diastolic function.  ------------------------------------------------------------------- Aortic valve:   Trileaflet; normal thickness leaflets.  Mobility was not restricted.  Doppler:  Transvalvular velocity was within the normal range. There was no stenosis. There was no regurgitation.   ------------------------------------------------------------------- Aorta:  Aortic root: The aortic root was normal in size.  ------------------------------------------------------------------- Mitral valve:   Structurally normal valve.   Mobility was not restricted.  Doppler:  Transvalvular velocity was within the normal range. There was no evidence for stenosis. There was mild regurgitation.  ------------------------------------------------------------------- Left atrium:  The atrium was normal in size.  ------------------------------------------------------------------- Right ventricle:  The cavity size was normal. Wall thickness was normal. Systolic function was normal.  ------------------------------------------------------------------- Pulmonic valve:    Structurally normal valve.   Cusp separation was normal.  Doppler:  Transvalvular velocity was within the normal range. There was no evidence for stenosis. There was trivial regurgitation.  ------------------------------------------------------------------- Tricuspid valve:   Structurally normal valve.    Doppler: Transvalvular velocity was within the normal range. There was mild regurgitation.  ------------------------------------------------------------------- Pulmonary artery:   The main pulmonary artery was normal-sized. Systolic pressure was within the normal range.  ------------------------------------------------------------------- Right atrium:  The atrium was mildly dilated.  ------------------------------------------------------------------- Pericardium:  There was no pericardial effusion.  ------------------------------------------------------------------- Systemic veins: Inferior vena cava: The vessel was dilated. The respirophasic diameter changes were  blunted (< 50%), consistent with elevated central venous pressure.  ------------------------------------------------------------------- Measurements   Left ventricle                         Value        Reference  LV ID, ED, PLAX chordal        (  H)     52.9  mm     43 - 52  LV ID, ES, PLAX chordal        (H)     43.6  mm     23 - 38  LV fx shortening, PLAX chordal (L)     18    %      >=29  LV PW thickness, ED                    13    mm     ----------  IVS/LV PW ratio, ED                    1.09         <=1.3  Stroke volume, 2D                      89    ml     ----------  Stroke volume/bsa, 2D                  37    ml/m^2 ----------  LV e&', lateral                         5.08  cm/s   ----------  LV E/e&', lateral                       10.96        ----------  LV e&', medial                          4.99  cm/s   ----------  LV E/e&', medial                        11.16        ----------  LV e&', average                         5.04  cm/s   ----------  LV E/e&', average                       11.06        ----------    Ventricular septum                     Value        Reference  IVS thickness, ED                      14.2  mm     ----------    LVOT                                   Value        Reference  LVOT ID, S                             25    mm     ----------  LVOT area                              4.91  cm^2   ----------  LVOT  peak velocity, S                  112   cm/s   ----------  LVOT mean velocity, S                  76.3  cm/s   ----------  LVOT VTI, S                            18.2  cm     ----------  LVOT peak gradient, S                  5     mm Hg  ----------    Aorta                                  Value        Reference  Aortic root ID, ED                     33    mm     ----------    Left atrium                            Value        Reference  LA ID, A-P, ES                         36    mm     ----------  LA ID/bsa, A-P                          1.49  cm/m^2 <=2.2  LA volume, S                           54.1  ml     ----------  LA volume/bsa, S                       22.4  ml/m^2 ----------  LA volume, ES, 1-p A4C                 41    ml     ----------  LA volume/bsa, ES, 1-p A4C             17    ml/m^2 ----------  LA volume, ES, 1-p A2C                 71.7  ml     ----------  LA volume/bsa, ES, 1-p A2C             29.7  ml/m^2 ----------    Mitral valve                           Value        Reference  Mitral E-wave peak velocity            55.7  cm/s   ----------  Mitral A-wave peak velocity            75.4  cm/s   ----------  Mitral deceleration time       (H)     268   ms  150 - 230  Mitral E/A ratio, peak                 0.7          ----------    Pulmonary arteries                     Value        Reference  PA pressure, S, DP                     28    mm Hg  <=30    Tricuspid valve                        Value        Reference  Tricuspid regurg peak velocity         180   cm/s   ----------  Tricuspid peak RV-RA gradient          13    mm Hg  ----------    Right atrium                           Value        Reference  RA ID, S-I, ES, A4C            (H)     56.1  mm     34 - 49  RA area, ES, A4C               (H)     21.7  cm^2   8.3 - 19.5  RA volume, ES, A/L                     68.1  ml     ----------  RA volume/bsa, ES, A/L                 28.2  ml/m^2 ----------    Systemic veins                         Value        Reference  Estimated CVP                          15    mm Hg  ----------    Right ventricle                        Value        Reference  TAPSE                                  8.65  mm     ----------  RV pressure, S, DP                     28    mm Hg  <=30  RV s&', lateral, S                      7.53  cm/s   ----------  Legend: (L)  and  (H)  mark values outside specified reference range.  ------------------------------------------------------------------- Prepared and Electronically  Authenticated by  Donato Schultz, M.D. 2018-10-10T15:31:23     Impression:  This 52 year old heavy smoker has  diffuse three-vessel coronary disease with severe left ventricular dysfunction.  His LAD has multiple sequential tight stenoses and diffuse disease and might possibly be graftable in the midportion jailed between 2 tight stenoses.  There is no way to graft beyond his tight stenoses which extend out to the apex.  His right coronary is chronically occluded at the ostium and there are some collaterals to the distal vessel but this portion is in the atrioventricular groove inferiorly and not accessible.  I do not see a graftable PDA or PLV branch.  The left circumflex marginal branches are small and diffusely diseased and not ideal for grafting.  I would still not recommend coronary artery bypass graft surgery for this patient because it would be at high risk and I do not think his vessels are suitable for grafting.  I think the best treatment for him is medical therapy with cardiac risk factor reduction including complete smoking cessation.  I reviewed the cath films with the patient and his mother and answered their questions.  Plan:  He is planning to follow-up with Dr. Okey DupreEnd in the near future.   I spent 15 minutes performing this established patient evaluation and > 50% of this time was spent face to face counseling and coordinating the care of this patient's severe multi-vessel coronary artery disease.    Alleen BorneBryan K Bartle, MD [facility] [phone]

## 2017-06-01 ENCOUNTER — Telehealth: Payer: Self-pay | Admitting: Pharmacist

## 2017-06-01 ENCOUNTER — Other Ambulatory Visit
Admission: RE | Admit: 2017-06-01 | Discharge: 2017-06-01 | Disposition: A | Discharge status: Home / Self Care | Payer: Medicaid Other | Source: Ambulatory Visit | Attending: Nurse Practitioner | Admitting: Nurse Practitioner

## 2017-06-01 DIAGNOSIS — I255 Ischemic cardiomyopathy: Secondary | ICD-10-CM | POA: Diagnosis present

## 2017-06-01 DIAGNOSIS — I2510 Atherosclerotic heart disease of native coronary artery without angina pectoris: Secondary | ICD-10-CM | POA: Diagnosis present

## 2017-06-01 DIAGNOSIS — I5023 Acute on chronic systolic (congestive) heart failure: Secondary | ICD-10-CM | POA: Diagnosis present

## 2017-06-01 LAB — COMPREHENSIVE METABOLIC PANEL
ALT: 21 U/L (ref 17–63)
ANION GAP: 9 (ref 5–15)
AST: 21 U/L (ref 15–41)
Albumin: 4.1 g/dL (ref 3.5–5.0)
Alkaline Phosphatase: 53 U/L (ref 38–126)
BUN: 17 mg/dL (ref 6–20)
CALCIUM: 9.3 mg/dL (ref 8.9–10.3)
CHLORIDE: 104 mmol/L (ref 101–111)
CO2: 26 mmol/L (ref 22–32)
Creatinine, Ser: 1.19 mg/dL (ref 0.61–1.24)
GFR calc non Af Amer: >60 mL/min (ref >60)
Glucose, Bld: 106 mg/dL — ABNORMAL HIGH (ref 65–99)
POTASSIUM: 4.1 mmol/L (ref 3.5–5.1)
SODIUM: 139 mmol/L (ref 135–145)
Total Bilirubin: 0.7 mg/dL (ref 0.3–1.2)
Total Protein: 7.9 g/dL (ref 6.5–8.1)

## 2017-06-01 NOTE — Telephone Encounter (Signed)
06/01/17 Faxed [employer] application for [employer]stat 0.4mg  Disolve 1 tablet under the tongue every 5 minutes as needed for chest pain, Do not exceed a total of 3 in 15 minutes.Forde RadonAJ

## 2017-06-02 LAB — MISC LABCORP TEST (SEND OUT): Labcorp test code: 120295

## 2017-06-04 ENCOUNTER — Telehealth: Payer: Self-pay | Admitting: Internal Medicine

## 2017-06-04 MED ORDER — EZETIMIBE 10 MG PO TABS: 10.0000 mg | ORAL_TABLET | Freq: Every day | ORAL | 3 refills | Status: DC

## 2017-06-04 NOTE — Telephone Encounter (Signed)
Spoke with patient and reviewed results and recommendations. He verbalized understanding and confirmed that he has been taking his atorvastatin daily. Reviewed Zetia and recommendations in order to get his LDL down and he was agreeable to try that. Advised that I would send that prescription in for him and confirmed pharmacy. Patient also reported that he his weights remain the same and that he has not noticed any difference yet. Reviewed medications and confirmed he is taking Furosemide and Spirolactone. Recommended that he monitor his daily weights and if he should have increase of 2 pounds in one day or 5 pounds in one week to let us know. He verbalized understanding of our conversation, agreement what plan, and had no further questions at this time.

## 2017-06-04 NOTE — Telephone Encounter (Signed)
-----   Message from Creig Hineshristopher Ronald Berge, NP sent at 06/04/2017  7:58 AM EST ----- Lytes/renal fxn stable.  LDL cholesterol = 100.  Down from 101108 in October (when he wasn't on a statin yet).  Please ensure that he is taking his lipitor as we would have expected to see a more dramatic drop in LDL while on it for the past 3 months.  Could consider zetia in addition to lipitor however that is likely to be cost prohibitive.

## 2017-06-04 NOTE — Telephone Encounter (Signed)
Patient returning call for lab results please call

## 2017-06-04 NOTE — Telephone Encounter (Signed)
-----   Message from Christopher Ronald Berge, NP sent at 06/04/2017  7:58 AM EST ----- Lytes/renal fxn stable.  LDL cholesterol = 100.  Down from 108 in October (when he wasn't on a statin yet).  Please ensure that he is taking his lipitor as we would have expected to see a more dramatic drop in LDL while on it for the past 3 months.  Could consider zetia in addition to lipitor however that is likely to be cost prohibitive. 

## 2017-06-06 ENCOUNTER — Telehealth: Payer: Self-pay | Admitting: Nurse Practitioner

## 2017-06-06 NOTE — Telephone Encounter (Signed)
Paper work received.  Holding for Ward Givenshris Berge, NP to sign.

## 2017-06-06 NOTE — Telephone Encounter (Signed)
Annette from [employer] dropped off paperwork to be completed and signed °Placed in Nurse Box °

## 2017-06-07 ENCOUNTER — Telehealth: Payer: Self-pay | Admitting: Pharmacist

## 2017-06-07 NOTE — Telephone Encounter (Signed)
06/07/17 Received a pharmacy printout for Zetia 10mg  Take 1 tablet daily, printed [employer] application, mailing patient his portion to sign & return, also taking [facility] Nicolas[facility] to sign.Forde RadonAJ

## 2017-06-08 NOTE — Telephone Encounter (Signed)
I spoke with Bradley Bridges at [employer]- she is aware the patient's paperwork has been signed and is ready for pick up.

## 2017-06-14 ENCOUNTER — Telehealth: Payer: Self-pay | Admitting: Pharmacist

## 2017-06-14 NOTE — Telephone Encounter (Signed)
06/14/17 Mailing [employer] application for [employer] 10mg  Take 1 tablet daily.Forde RadonAJ

## 2017-06-21 NOTE — Progress Notes (Signed)
Patient ID: Bradley Bridges, male    DOB: [birth year], 52 y.o.   MRN: [id number]  HPI  Mr Bradley Bridges is a 52 y/o male with a history of CAD, alcohol use, GERD, HTN, NSTEMI, current tobacco use and chronic heart failure.   Echo report from 02/28/17 reviewed and showed an EF of 20-25% along with mild MR/TR. EF in 2009 was 55%. Cardiac catheterization done 04/09/17 showed severe three vessel CAD with no significant change in coronary anatomy since previous catheterization. Well-developed collaterals from the LAD to the right RCA. Mildly elevated left ventricular end-diastolic pressure. Referral considered back to cardiothoracic surgery for CABG.   Admitted 04/06/17 due to NSTEMI. Cardiology consult was obtained. Catheterization done which showed multivessel disease. Initially needed heparin drip and NTG drip. Discharged after 4 days. Admitted 02/27/17 due to chest pain. [facility]. Discharged after 3 days.   Patient presents today for a follow-up visit with a chief complaint of minimal fatigue upon moderate exertion. He says this has been present for several months. He has associated fatigue, head congestion, sore throat and difficulty sleeping along with this. He denies any chest pain, cough, edema, palpitations, abdominal distention, dizziness or weight gain.   Past Medical History:  Diagnosis Date  . Coronary artery disease    a. 02/2017 NSTEMI/Cath: LM 25, LAD 25ost, 70p, 10966m, 70/90d, D1/2 mod dzs, RI 80, LCX 5565m, OM1 small, OM2 mod dzs, RCA 100p, RPDA fills via L->R collats-->Not felt to be a good surgical candidate-->Med Rx; b. 03/2017 NSTEMI/Cath: LM 25, LAD 25ost, 70p, 4966m, 70/90d, D1 70ost, D2 80ost, RI 80, LCX 80p->d, OM1 90, RCA 100p, RPDA fills from L->R collats-->Med Rx.  . ETOH abuse   . GERD (gastroesophageal reflux disease)    a. x 20 yrs  . Gout   . HFrEF (heart failure with reduced ejection fraction) (HCC)    a. 02/2017 Echo: EF 20-25%,  diff HK, mild MR, mildly dil RA, mild TR.  Bradley Bridges. Hypertension    a. x 20 yrs - no meds since 2017.  . Ischemic cardiomyopathy    a. 02/2017 Echo: EF 20-25%, diff HK.  . NSTEMI (non-ST elevated myocardial infarction) (HCC)    a. 02/2017--sev 3VD-->turned down for CABG-->Med Rx.  . Obesity   . Tobacco abuse    Past Surgical History:  Procedure Laterality Date  . LEFT HEART CATH AND CORONARY ANGIOGRAPHY N/A 02/27/2017   Procedure: LEFT HEART CATH AND CORONARY ANGIOGRAPHY;  Surgeon: Yvonne KendallEnd, Christopher, MD;  Location: [facility] INVASIVE CV LAB;  Service: Cardiovascular;  Laterality: N/A;  . LEFT HEART CATH AND CORONARY ANGIOGRAPHY N/A 04/09/2017   Procedure: LEFT HEART CATH AND CORONARY ANGIOGRAPHY;  Surgeon: Iran OuchArida, Muhammad A, MD;  Location: [facility] INVASIVE CV LAB;  Service: Cardiovascular;  Laterality: N/A;   Family History  Problem Relation Age of Onset  . Rheum arthritis Mother   . CAD Father        First MI in his 4240's.  Died in his 1950's  . Other Father        liver failure  . Diabetes Sister   . CAD Brother        s/p stenting   Social History   Tobacco Use  . Smoking status: Current Every Day Smoker    Packs/day: 1.00    Years: 30.00    Pack years: 30.00    Types: Cigarettes  . Smokeless tobacco: Never Used  Substance Use Topics  . Alcohol use: Yes  Comment: 1/5th of Vodka every weekend- states he quit 03/15/17   Allergies  Allergen Reactions  . Penicillins Rash    Has patient had a PCN reaction causing immediate rash, facial/tongue/throat swelling, SOB or lightheadedness with hypotension: Yes Has patient had a PCN reaction causing severe rash involving mucus membranes or skin necrosis: No Has patient had a PCN reaction that required hospitalization: No Has patient had a PCN reaction occurring within the last 10 years: No If all of the above answers are "NO", then may proceed with Cephalosporin use.   Prior to Admission medications   Medication Sig Start Date End Date Taking?  Authorizing Provider  acetaminophen (TYLENOL) 500 MG tablet Take 1,000 mg by mouth every 4 (four) hours as needed.   Yes [provider]  aspirin 81 MG EC tablet Take 1 tablet (81 mg total) by mouth daily. 05/10/17  Yes Clarisa Kindred A, FNP  atorvastatin (LIPITOR) 80 MG tablet Take 1 tablet (80 mg total) by mouth daily at 6 PM. 05/10/17  Yes Clarisa Kindred A, FNP  carvedilol (COREG) 12.5 MG tablet Take 1 tablet (12.5 mg total) by mouth 2 (two) times daily. 05/10/17  Yes Clarisa Kindred A, FNP  clopidogrel (PLAVIX) 75 MG tablet Take 1 tablet (75 mg total) by mouth daily. 05/10/17  Yes Clarisa Kindred A, FNP  ezetimibe (ZETIA) 10 MG tablet Take 1 tablet (10 mg total) by mouth daily. 06/04/17 09/02/17 Yes Creig Hines, NP  furosemide (LASIX) 20 MG tablet Take 1 tablet (20 mg total) by mouth daily. 05/21/17 08/19/17 Yes Creig Hines, NP  isosorbide mononitrate (IMDUR) 30 MG 24 hr tablet Take 1 tablet (30 mg total) by mouth daily. 05/10/17  Yes Talor Desrosiers, Inetta Fermo A, FNP  lisinopril (PRINIVIL,ZESTRIL) 5 MG tablet Take 1 tablet (5 mg total) by mouth daily. 05/10/17 08/08/17 Yes Mersadies Petree, Inetta Fermo A, FNP  nitroGLYCERIN (NITROSTAT) 0.4 MG SL tablet Place 1 tablet (0.4 mg total) under the tongue every 5 (five) minutes x 3 doses as needed for chest pain. 05/10/17  Yes Clarisa Kindred A, FNP  omeprazole (PRILOSEC OTC) 20 MG tablet Take 1 tablet (20 mg total) by mouth daily. 05/10/17  Yes Darrien Laakso, Inetta Fermo A, FNP  ranitidine (ZANTAC) 150 MG tablet Take 150 mg by mouth 2 (two) times daily.   Yes [provider]  spironolactone (ALDACTONE) 25 MG tablet Take 1 tablet (25 mg total) by mouth daily. 05/10/17 05/10/18 Yes Delma Freeze, FNP    Review of Systems  Constitutional: Positive for fatigue. Negative for appetite change.  HENT: Positive for congestion, postnasal drip and sore throat.   Eyes: Negative.   Respiratory: Positive for shortness of breath (with heavy exertion). Negative for  cough and chest tightness.   Cardiovascular: Negative for chest pain, palpitations and leg swelling.  Gastrointestinal: Negative for abdominal distention and abdominal pain.  Endocrine: Negative.   Genitourinary: Negative.   Musculoskeletal: Positive for arthralgias (gout in hands/fingers). Negative for back pain.  Skin: Negative.   Allergic/Immunologic: Negative.   Neurological: Negative for dizziness and light-headedness.  Hematological: Negative for adenopathy. Does not bruise/bleed easily.  Psychiatric/Behavioral: Positive for sleep disturbance (somedays sleep well and sometimes not). Negative for dysphoric mood. The patient is not nervous/anxious.    Vitals:   06/22/17 1318  BP: 131/80  Pulse: 95  Resp: 18  SpO2: 98%  Weight: 241 lb 4 oz (109.4 kg)  Height: 6\' 1"  (1.854 m)   Wt Readings from Last 3 Encounters:  06/22/17 241 lb 4 oz (  109.4 kg)  05/30/17 241 lb (109.3 kg)  05/21/17 240 lb 4 oz (109 kg)   Lab Results  Component Value Date   CREATININE 1.19 06/01/2017   CREATININE 1.20 04/25/2017   CREATININE 1.09 04/10/2017    Physical Exam  Constitutional: He is oriented to person, place, and time. He appears well-developed and well-nourished.  HENT:  Head: Normocephalic and atraumatic.  Neck: Normal range of motion. Neck supple. No JVD present.  Cardiovascular: Normal rate and regular rhythm.  Pulmonary/Chest: Effort normal. He has no wheezes. He has no rales.  Abdominal: Soft. He exhibits no distension. There is no tenderness.  Musculoskeletal: He exhibits no edema or tenderness.  Neurological: He is alert and oriented to person, place, and time.  Skin: Skin is warm and dry.  Psychiatric: He has a normal mood and affect. His behavior is normal. Thought content normal.  Nursing note and vitals reviewed.  Assessment & Plan:  1: Chronic heart failure with reduced ejection fraction- - NYHA class II - euvolemic today - weighing daily. Reminded to call for an  overnight weight gain of >2 pounds or a weekly weight gain of >5 pounds - does add salt to eggs, tomatoes and potatoes. Reviewed the importance of not adding any salt to his food and closely following a 2000mg  sodium diet.   - drinking 2 cups coffee, 3-4 glasses of [place] and some water daily - saw cardiology Brion Aliment) 05/21/17 and returns 07/25/17 - he is to finish out his lisinopril, skip one day and then begin entresto 24/26mg  twice daily. 30 voucher given to him with an RX sent to [facility] for his free 30 days and a 90 day RX sent to medication management clinic - will check a BMP at his next visit since adding entresto  2: HTN- - BP looks good today - planning on getting established at [facility] - BMP from 04/25/17 reviewed and shows sodium 137, potassium 4.0 and GFR >60  3: CAD- - saw cardiothoracic surgeon Laneta Simmers) 05/30/17 and CABG was not recommended  4: Tobacco use- - currently smoking ~ 1ppd of cigarettes - had stopped 3 times previously - complete cessation discussed for 3 minutes with him  Medication list was reviewed.  Return here in 6 weeks or sooner for any questions/problems before then.

## 2017-06-22 ENCOUNTER — Encounter: Payer: Self-pay | Admitting: Family

## 2017-06-22 ENCOUNTER — Ambulatory Visit: Payer: Medicaid Other | Attending: Family | Admitting: Family

## 2017-06-22 VITALS — BP 131/80 | HR 95 | Resp 18 | Ht 73.0 in | Wt 241.2 lb

## 2017-06-22 DIAGNOSIS — Z7982 Long term (current) use of aspirin: Secondary | ICD-10-CM | POA: Diagnosis not present

## 2017-06-22 DIAGNOSIS — I252 Old myocardial infarction: Secondary | ICD-10-CM | POA: Insufficient documentation

## 2017-06-22 DIAGNOSIS — M109 Gout, unspecified: Secondary | ICD-10-CM | POA: Diagnosis not present

## 2017-06-22 DIAGNOSIS — I255 Ischemic cardiomyopathy: Secondary | ICD-10-CM | POA: Insufficient documentation

## 2017-06-22 DIAGNOSIS — Z79899 Other long term (current) drug therapy: Secondary | ICD-10-CM | POA: Diagnosis not present

## 2017-06-22 DIAGNOSIS — F17210 Nicotine dependence, cigarettes, uncomplicated: Secondary | ICD-10-CM | POA: Diagnosis not present

## 2017-06-22 DIAGNOSIS — K219 Gastro-esophageal reflux disease without esophagitis: Secondary | ICD-10-CM | POA: Insufficient documentation

## 2017-06-22 DIAGNOSIS — I2510 Atherosclerotic heart disease of native coronary artery without angina pectoris: Secondary | ICD-10-CM | POA: Diagnosis present

## 2017-06-22 DIAGNOSIS — I5022 Chronic systolic (congestive) heart failure: Secondary | ICD-10-CM | POA: Diagnosis present

## 2017-06-22 DIAGNOSIS — Z880 Allergy status to penicillin: Secondary | ICD-10-CM | POA: Diagnosis not present

## 2017-06-22 DIAGNOSIS — E669 Obesity, unspecified: Secondary | ICD-10-CM | POA: Insufficient documentation

## 2017-06-22 DIAGNOSIS — Z7289 Other problems related to lifestyle: Secondary | ICD-10-CM | POA: Insufficient documentation

## 2017-06-22 DIAGNOSIS — I10 Essential (primary) hypertension: Secondary | ICD-10-CM

## 2017-06-22 DIAGNOSIS — Z720 Tobacco use: Secondary | ICD-10-CM

## 2017-06-22 DIAGNOSIS — I110 Hypertensive heart disease with heart failure: Secondary | ICD-10-CM | POA: Insufficient documentation

## 2017-06-22 MED ORDER — SACUBITRIL-VALSARTAN 24-26 MG PO TABS: 1.0000 | ORAL_TABLET | Freq: Two times a day (BID) | ORAL | 3 refills | Status: DC

## 2017-06-22 MED ORDER — SACUBITRIL-VALSARTAN 24-26 MG PO TABS: 1.0000 | ORAL_TABLET | Freq: Two times a day (BID) | ORAL | 0 refills | Status: DC

## 2017-06-22 NOTE — Patient Instructions (Addendum)
Continue weighing daily and call for an overnight weight gain of > 2 pounds or a weekly weight gain of >5 pounds.  Finish taking the lisinopril. When you finish that bottle, you will skip one day and then begin entresto twice daily. Take all your other medications as ordered.   Can take ranitidine (zantac) as needed

## 2017-07-02 ENCOUNTER — Telehealth: Payer: Self-pay | Admitting: Pharmacist

## 2017-07-02 NOTE — Telephone Encounter (Signed)
07/02/17 Faxed [employer] application for Ball CorporationEntresto 24/26 mg Take 1 tablet by mouth two times a day, ICD10: I50.22.Forde RadonAJ

## 2017-07-25 ENCOUNTER — Encounter: Payer: Self-pay | Admitting: Internal Medicine

## 2017-07-25 ENCOUNTER — Ambulatory Visit (INDEPENDENT_AMBULATORY_CARE_PROVIDER_SITE_OTHER): Payer: Self-pay | Admitting: Internal Medicine

## 2017-07-25 VITALS — BP 140/72 | HR 80 | Ht 73.0 in | Wt 245.0 lb

## 2017-07-25 DIAGNOSIS — I10 Essential (primary) hypertension: Secondary | ICD-10-CM

## 2017-07-25 DIAGNOSIS — E785 Hyperlipidemia, unspecified: Secondary | ICD-10-CM

## 2017-07-25 DIAGNOSIS — I25118 Atherosclerotic heart disease of native coronary artery with other forms of angina pectoris: Secondary | ICD-10-CM

## 2017-07-25 DIAGNOSIS — Z720 Tobacco use: Secondary | ICD-10-CM

## 2017-07-25 DIAGNOSIS — I255 Ischemic cardiomyopathy: Secondary | ICD-10-CM

## 2017-07-25 DIAGNOSIS — I5022 Chronic systolic (congestive) heart failure: Secondary | ICD-10-CM

## 2017-07-25 MED ORDER — CARVEDILOL 25 MG PO TABS: 25.0000 mg | ORAL_TABLET | Freq: Two times a day (BID) | ORAL | 3 refills | Status: DC

## 2017-07-25 MED ORDER — NICOTINE 14 MG/24HR TD PT24: 14.0000 mg | MEDICATED_PATCH | Freq: Every day | TRANSDERMAL | 3 refills | Status: DC

## 2017-07-25 NOTE — Progress Notes (Signed)
Follow-up Outpatient Visit Date: 07/25/2017  Primary Care Provider: Patient, [name] Per No address on file  Chief Complaint: Fatigue  HPI:  Bradley Bridges is a 52 y.o. year-old male with history of multivessel coronary artery disease not amenable to CABG or PCI, chronic systolic heart failure due to ischemic cardiomyopathy, hypertension, obesity, and tobacco/alcohol abuse, who presents for follow-up of coronary artery disease and heart failure. He was last seen in our office by Ward Givens, NP, on 05/21/17. At that time, he noted stable exertional dyspnea with 7 pound weight gain over the preceding month. He was reassessed by Dr. Laneta Simmers in the cardiac surgery clinic and was again deemed a poor operative candidate due to his extensive CAD with poor targets. He was seen by Clarisa Kindred, NP, on 06/22/17, at which time she recommended switching him from lisinopril to [facility].  Today, Bradley Bridges repots doing well except for persistent fatigue. He states that he does not have the desire to get up and do much. He is able to carry out most activities without chest pain or shortness of breath. He has stable dyspnea when walking extended distances. He notes a vague uneasy feeling in his chest when he "gets excited" or does strenuous activities. He has not had frank chest pain. He is tolerating his current medications well. He denies bleeding, remaining on DAPT with ASA and clopidogrel. Bradley Bridges is interested in using nicotine patches to help him stop smoking.  --------------------------------------------------------------------------------------------------  Past Medical History:  Diagnosis Date  . Coronary artery disease    a. 02/2017 NSTEMI/Cath: LM 25, LAD 25ost, 70p, 59m, 70/90d, D1/2 mod dzs, RI 80, LCX 15m, OM1 small, OM2 mod dzs, RCA 100p, RPDA fills via L->R collats-->Not felt to be a good surgical candidate-->Med Rx; b. 03/2017 NSTEMI/Cath: LM 25, LAD 25ost, 70p, 59m, 70/90d, D1 70ost, D2 80ost,  RI 80, LCX 80p->d, OM1 90, RCA 100p, RPDA fills from L->R collats-->Med Rx.  . ETOH abuse   . GERD (gastroesophageal reflux disease)    a. x 20 yrs  . Gout   . HFrEF (heart failure with reduced ejection fraction) (HCC)    a. 02/2017 Echo: EF 20-25%, diff HK, mild MR, mildly dil RA, mild TR.  Marland Kitchen Hypertension    a. x 20 yrs - no meds since 2017.  . Ischemic cardiomyopathy    a. 02/2017 Echo: EF 20-25%, diff HK.  . NSTEMI (non-ST elevated myocardial infarction) (HCC)    a. 02/2017--sev 3VD-->turned down for CABG-->Med Rx.  . Obesity   . Tobacco abuse    Past Surgical History:  Procedure Laterality Date  . LEFT HEART CATH AND CORONARY ANGIOGRAPHY N/A 02/27/2017   Procedure: LEFT HEART CATH AND CORONARY ANGIOGRAPHY;  Surgeon: Yvonne Kendall, MD;  Location: [facility] INVASIVE CV LAB;  Service: Cardiovascular;  Laterality: N/A;  . LEFT HEART CATH AND CORONARY ANGIOGRAPHY N/A 04/09/2017   Procedure: LEFT HEART CATH AND CORONARY ANGIOGRAPHY;  Surgeon: Iran Ouch, MD;  Location: [facility] INVASIVE CV LAB;  Service: Cardiovascular;  Laterality: N/A;    Current Meds  Medication Sig  . acetaminophen (TYLENOL) 500 MG tablet Take 1,000 mg by mouth every 4 (four) hours as needed.  Marland Kitchen aspirin 81 MG EC tablet Take 1 tablet (81 mg total) by mouth daily.  Marland Kitchen atorvastatin (LIPITOR) 80 MG tablet Take 1 tablet (80 mg total) by mouth daily at 6 PM.  . carvedilol (COREG) 12.5 MG tablet Take 1 tablet (12.5 mg total) by mouth 2 (two) times daily.  Marland Kitchen  clopidogrel (PLAVIX) 75 MG tablet Take 1 tablet (75 mg total) by mouth daily.  . furosemide (LASIX) 20 MG tablet Take 1 tablet (20 mg total) by mouth daily.  . isosorbide mononitrate (IMDUR) 30 MG 24 hr tablet Take 1 tablet (30 mg total) by mouth daily.  . nitroGLYCERIN (NITROSTAT) 0.4 MG SL tablet Place 1 tablet (0.4 mg total) under the tongue every 5 (five) minutes x 3 doses as needed for chest pain.  Marland Kitchen omeprazole (PRILOSEC OTC) 20 MG tablet Take 1 tablet (20 mg  total) by mouth daily.  . ranitidine (ZANTAC) 150 MG tablet Take 150 mg by mouth 2 (two) times daily.  . sacubitril-valsartan (ENTRESTO) 24-26 MG Take 1 tablet by mouth 2 (two) times daily.  Marland Kitchen spironolactone (ALDACTONE) 25 MG tablet Take 1 tablet (25 mg total) by mouth daily.    Allergies: Penicillins  Social History   Socioeconomic History  . Marital status: Single    Spouse name: Not on file  . Number of children: Not on file  . Years of education: Not on file  . Highest education level: Not on file  Social Needs  . Financial resource strain: Not on file  . Food insecurity - worry: Not on file  . Food insecurity - inability: Not on file  . Transportation needs - medical: Not on file  . Transportation needs - non-medical: Not on file  Occupational History  . Not on file  Tobacco Use  . Smoking status: Current Every Day Smoker    Packs/day: 0.50    Years: 30.00    Pack years: 15.00    Types: Cigarettes  . Smokeless tobacco: Never Used  Substance and Sexual Activity  . Alcohol use: Yes    Comment: 1/5th of Vodka every weekend- states he quit 03/15/17  . 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 Sexual activity: Not on file  Other Topics Concern  . Not on file  Social History Narrative   Independent at baseline.  [place]  Does not routinely exercise.    Family History  Problem Relation Age of Onset  . Rheum arthritis Mother   . CAD Father        First MI in his 42's.  Died in his 36's  . Other Father        liver failure  . Diabetes Sister   . CAD Brother        s/p stenting    Review of Systems: A 12-system review of systems was performed and was negative except as noted in the HPI.  --------------------------------------------------------------------------------------------------  Physical Exam: BP 140/72 (BP Location: Left Arm, Patient Position: Sitting, Cuff Size: Normal)    Pulse 80   Ht 6\' 1"  (1.854 m)   Wt 245 lb (111.1 kg)   BMI 32.32 kg/m   General:  NAD HEENT: No conjunctival pallor or scleral icterus. Moist mucous membranes.  OP clear. Neck: Supple without lymphadenopathy, thyromegaly, JVD, or HJR. No carotid bruit. Lungs: Normal work of breathing. Clear to auscultation bilaterally without wheezes or crackles. Heart: Regular rate and rhythm without murmurs, rubs, or gallops. Non-displaced PMI. Abd: Bowel sounds present. Soft, NT/ND without hepatosplenomegaly Ext: No lower extremity edema. Radial, PT, and DP pulses are 2+ bilaterally. Skin: Warm and dry without rash.  EKG:  NSR with frequent PVCs, inferior Q-waves, and non-specific anterolateral ST/T changes.  Lab Results  Component Value Date  WBC 10.0 04/10/2017   HGB 12.9 (L) 04/10/2017   HCT 37.6 (L) 04/10/2017   MCV 87.3 04/10/2017   PLT 376 04/10/2017    Lab Results  Component Value Date   NA 139 06/01/2017   K 4.1 06/01/2017   CL 104 06/01/2017   CO2 26 06/01/2017   BUN 17 06/01/2017   CREATININE 1.19 06/01/2017   GLUCOSE 106 (H) 06/01/2017   ALT 21 06/01/2017    Lab Results  Component Value Date   CHOL 196 02/26/2017   HDL 44 02/26/2017   LDLCALC 108 (H) 02/26/2017   TRIG 218 (H) 02/26/2017   CHOLHDL 4.5 02/26/2017    --------------------------------------------------------------------------------------------------  ASSESSMENT AND PLAN: Coronary artery disease with stable angina No significant chest pain in the setting of severe multivessel CAD. He is not a candidate for CABG given his diffuse disease and poor targets. I have reviewed his angiograms and do not see any reasonable PCI targets either, given the diffuse nature of his disease. We will continue with aggressive medical therapy, including DAPT, carvedilol, and isosorbide mononitrate.  Chronic systolic heart failure due to ischemic cardiomyopathy Bradley Bridges appears euvolemic with NYHA class II-III symptoms. We  have agreed to increase carvedilol to 25 mg BID. He should remain on his current dose of Entresto. I will check a BMP to ensure stable renal function since transitioning from lisinopril. I will also check a Mg level, given frequent PVCs on EKG today. If his blood pressure tolerates, Entresto can uptitrated when Bradley Bridges follows up with us or Clarisa Kindredina Hackney, NP. Once his evidence based heart failure therapy has been optimized, we will need to repeat an echo to reassess Bradley Bridges' LVEF to help guide the need for ICD placement for primary prevention.  Hypertension Blood pressure is mildly elevated. We will increase carvedilol, as above.  Hyperlipidemia (goal LDL < 70) Patient is tolerating atorvastatin 80 mg daily well. He has not yet received ezetimibe from his pharmacy. He will need a repeat lipid panel 6-8 weeks after starting ezetimibe.  Tobacco use Smoking cessation encouraged. We discussed quitting strategies and have agreed to try nicotine patches. If this proves unsuccessful, Chantix could be considered in the future.  Follow-up: Return to clinic in 6 weeks with Ward Givenshris Berge, NP.  Yvonne Kendallhristopher Marston Mccadden, MD 07/25/2017 3:43 PM

## 2017-07-25 NOTE — Patient Instructions (Signed)
Medication Instructions:  Your physician has recommended you make the following change in your medication:  1- INCREASE Carvedilol to 25 mg by mouth two times a day. 2- Nicotine patch 14 mg, 1 patch transdermal every 24 hours. Change every 24 hours.   Labwork: Your physician recommends that you return for lab work in: TODAY (BMET, MG).   Testing/Procedures: none  Follow-Up: Your physician recommends that you schedule a follow-up appointment in: 6 WEEKS WITH CHRIS BERGE, NP.  If you need a refill on your cardiac medications before your next appointment, please call your pharmacy.     Coping with Quitting Smoking Quitting smoking is a physical and mental challenge. You will face cravings, withdrawal symptoms, and temptation. Before quitting, work with your health care provider to make a plan that can help you cope. Preparation can help you quit and keep you from giving in. How can I cope with cravings? Cravings usually last for 5-10 minutes. If you get through it, the craving will pass. Consider taking the following actions to help you cope with cravings:  Keep your mouth busy: ? Chew sugar-free gum. ? Suck on hard candies or a straw. ? Brush your teeth.  Keep your hands and body busy: ? Immediately change to a different activity when you feel a craving. ? Squeeze or play with a ball. ? Do an activity or a hobby, like making bead jewelry, practicing needlepoint, or working with wood. ? Mix up your normal routine. ? Take a short exercise break. Go for a quick walk or run up and down stairs. ? Spend time in public places where smoking is not allowed.  Focus on doing something kind or helpful for someone else.  Call a friend or family member to talk during a craving.  Join a support group.  Call a quit line, such as 1-800-QUIT-NOW.  Talk with your health care provider about medicines that might help you cope with cravings and make quitting easier for you.  How can I deal  with withdrawal symptoms? Your body may experience negative effects as it tries to get used to not having nicotine in the system. These effects are called withdrawal symptoms. They may include:  Feeling hungrier than normal.  Trouble concentrating.  Irritability.  Trouble sleeping.  Feeling depressed.  Restlessness and agitation.  Craving a cigarette.  To manage withdrawal symptoms:  Avoid places, people, and activities that trigger your cravings.  Remember why you want to quit.  Get plenty of sleep.  Avoid coffee and other caffeinated drinks. These may worsen some of your symptoms.  How can I handle social situations? Social situations can be difficult when you are quitting smoking, especially in the first few weeks. To manage this, you can:  Avoid parties, bars, and other social situations where people might be smoking.  Avoid alcohol.  Leave right away if you have the urge to smoke.  Explain to your family and friends that you are quitting smoking. Ask for understanding and support.  Plan activities with friends or family where smoking is not an option.  What are some ways I can cope with stress? Wanting to smoke may cause stress, and stress can make you want to smoke. Find ways to manage your stress. Relaxation techniques can help. For example:  Breathe slowly and deeply, in through your nose and out through your mouth.  Listen to soothing, relaxing music.  Talk with a family member or friend about your stress.  Light a candle.  Soak in  a bath or take a shower.  Think about a peaceful place.  What are some ways I can prevent weight gain? Be aware that many people gain weight after they quit smoking. However, not everyone does. To keep from gaining weight, have a plan in place before you quit and stick to the plan after you quit. Your plan should include:  Having healthy snacks. When you have a craving, it may help to: ? Eat plain popcorn, crunchy  carrots, celery, or other cut vegetables. ? Chew sugar-free gum.  Changing how you eat: ? Eat small portion sizes at meals. ? Eat 4-6 small meals throughout the day instead of 1-2 large meals a day. ? Be mindful when you eat. Do not watch television or do other things that might distract you as you eat.  Exercising regularly: ? Make time to exercise each day. If you do not have time for a long workout, do short bouts of exercise for 5-10 minutes several times a day. ? Do some form of strengthening exercise, like weight lifting, and some form of aerobic exercise, like running or swimming.  Drinking plenty of water or other low-calorie or no-calorie drinks. Drink 6-8 glasses of water daily, or as much as instructed by your health care provider.  Summary  Quitting smoking is a physical and mental challenge. You will face cravings, withdrawal symptoms, and temptation to smoke again. Preparation can help you as you go through these challenges.  You can cope with cravings by keeping your mouth busy (such as by chewing gum), keeping your body and hands busy, and making calls to family, friends, or a helpline for people who want to quit smoking.  You can cope with withdrawal symptoms by avoiding places where people smoke, avoiding drinks with caffeine, and getting plenty of rest.  Ask your health care provider about the different ways to prevent weight gain, avoid stress, and handle social situations. This information is not intended to replace advice given to you by your health care provider. Make sure you discuss any questions you have with your health care provider. Document Released: 05/05/2016 Document Revised: 05/05/2016 Document Reviewed: 05/05/2016 Elsevier Interactive Patient Education  2018 [employer].     Nicotine skin patches What is this medicine? NICOTINE (NIK oh teen) helps people stop smoking. The patches replace the nicotine found in cigarettes and help to decrease  withdrawal effects. They are most effective when used in combination with a stop-smoking program. This medicine may be used for other purposes; ask your health care provider or pharmacist if you have questions. COMMON BRAND NAME(S): Habitrol, Nicoderm CQ, Nicotrol What should I tell my health care provider before I take this medicine? They need to know if you have any of these conditions: -diabetes -heart disease, angina, irregular heartbeat or previous heart attack -high blood pressure -lung disease, including asthma -overactive thyroid -pheochromocytoma -seizures or a history of seizures -skin problems, like eczema -stomach problems or ulcers -an unusual or allergic reaction to nicotine, adhesives, other medicines, foods, dyes, or preservatives -pregnant or trying to get pregnant -breast-feeding How should I use this medicine? This medicine is for use on the skin. Follow the directions that come with the patches. Find an area of skin on your upper arm, chest, or back that is clean, dry, greaseless, undamaged and hairless. Wash hands with plain soap and water. Do not use anything that contains aloe, lanolin or glycerin as these may prevent the patch from sticking. Dry thoroughly. Remove the patch from  the sealed pouch. Do not try to cut or trim the patch. Using your palm, press the patch firmly in place for 10 seconds to make sure that there is good contact with your skin. After applying the patch, wash your hands. Change the patch every day, keeping to a regular schedule. When you apply a new patch, use a new area of skin. Wait at least 1 week before using the same area again. Talk to your pediatrician regarding the use of this medicine in children. Special care may be needed. Overdosage: If you think you have taken too much of this medicine contact a poison control center or emergency room at once. NOTE: This medicine is only for you. Do not share this medicine with others. What if I miss a  dose? If you forget to replace a patch, use it as soon as you can. Only use one patch at a time and do not leave on the skin for longer than directed. If a patch falls off, you can replace it, but keep to your schedule and remove the patch at the right time. What may interact with this medicine? -medicines for asthma -medicines for blood pressure -medicines for mental depression This list may not describe all possible interactions. Give your health care provider a list of all the medicines, herbs, non-prescription drugs, or dietary supplements you use. Also tell them if you smoke, drink alcohol, or use illegal drugs. Some items may interact with your medicine. What should I watch for while using this medicine? You should begin using the nicotine patch the day you stop smoking. It is okay if you do not succeed at your attempt to quit and have a cigarette. You can still continue your quit attempt and keep using the product as directed. Just throw away your cigarettes and get back to your quit plan. You can keep the patch in place during swimming, bathing, and showering. If your patch falls off during these activities, replace it. When you first apply the patch, your skin may itch or burn. This should go away soon. When you remove a patch, the skin may look red, but this should only last for a few days. Call your doctor or health care professional if skin redness does not go away after 4 days, if your skin swells, or if you get a rash. If you are a diabetic and you quit smoking, the effects of insulin may be increased and you may need to reduce your insulin dose. Check with your doctor or health care professional about how you should adjust your insulin dose. If you are going to have a magnetic resonance imaging (MRI) procedure, tell your MRI technician if you have this patch on your body. It must be removed before a MRI. What side effects may I notice from receiving this medicine? Side effects that you  should report to your doctor or health care professional as soon as possible: -allergic reactions like skin rash, itching or hives, swelling of the face, lips, or tongue -breathing problems -changes in hearing -changes in vision -chest pain -cold sweats -confusion -fast, irregular heartbeat -feeling faint or lightheaded, falls -headache -increased saliva -skin redness that lasts more than 4 days -stomach pain -signs and symptoms of nicotine overdose like nausea; vomiting; dizziness; weakness; and rapid heartbeat Side effects that usually do not require medical attention (report to your doctor or health care professional if they continue or are bothersome): -diarrhea -dry mouth -hiccups -irritability -nervousness or restlessness -trouble sleeping or vivid dreams  This list may not describe all possible side effects. Call your doctor for medical advice about side effects. You may report side effects to FDA at 1-800-FDA-1088. Where should I keep my medicine? Keep out of the reach of children. Store at room temperature between 20 and 25 degrees C (68 and 77 degrees F). Protect from heat and light. Store in Tax inspector until ready to use. Throw away unused medicine after the expiration date. When you remove a patch, fold with sticky sides together; put in an empty opened pouch and throw away. NOTE: This sheet is a summary. It may not cover all possible information. If you have questions about this medicine, talk to your doctor, pharmacist, or health care provider.  2018 Elsevier/Gold Standard (2014-04-06 15:46:21)

## 2017-07-26 LAB — BASIC METABOLIC PANEL
BUN/Creatinine Ratio: 12 (ref 9–20)
BUN: 14 mg/dL (ref 6–24)
CALCIUM: 9.9 mg/dL (ref 8.7–10.2)
CO2: 21 mmol/L (ref 20–29)
CREATININE: 1.2 mg/dL (ref 0.76–1.27)
Chloride: 100 mmol/L (ref 96–106)
GFR calc Af Amer: 81 mL/min/{1.73_m2} (ref >59)
GFR, EST NON AFRICAN AMERICAN: 70 mL/min/{1.73_m2} (ref >59)
Glucose: 123 mg/dL — ABNORMAL HIGH (ref 65–99)
Potassium: 4.7 mmol/L (ref 3.5–5.2)
Sodium: 136 mmol/L (ref 134–144)

## 2017-07-26 LAB — MAGNESIUM: Magnesium: 1.8 mg/dL (ref 1.6–2.3)

## 2017-07-27 DIAGNOSIS — Z736 Limitation of activities due to disability: Secondary | ICD-10-CM

## 2017-07-31 NOTE — Progress Notes (Signed)
Patient ID: Bradley Bridges, male    DOB: May 28, 1965, 52 y.o.   MRN: [id number]  HPI  Bradley Bridges is a 52 y/o male with a history of CAD, alcohol use, GERD, HTN, NSTEMI, current tobacco use and chronic heart failure.   Echo report from 02/28/17 reviewed and showed an EF of 20-25% along with mild Bradley/TR. EF in 2009 was 55%. Cardiac catheterization done 04/09/17 showed severe three vessel CAD with no significant change in coronary anatomy since previous catheterization. Well-developed collaterals from the LAD to the right RCA. Mildly elevated left ventricular end-diastolic pressure. Referral considered back to cardiothoracic surgery for CABG.   Admitted 04/06/17 due to NSTEMI. Cardiology consult was obtained. Catheterization done which showed multivessel disease. Initially needed heparin drip and NTG drip. Discharged after 4 days. Admitted 02/27/17 due to chest pain. Was transferred to [facility] for surgical evaluation. Discharged after 3 days.   Patient presents today for a follow-up visit with a chief complaint of mild shortness of breath upon moderate exertion. He describes this as chronic in nature having been present for several months. He has associated fatigue, light-headedness, gout and difficulty sleeping. He denies abdominal distention, palpitations, edema, chest pain or cough.   Past Medical History:  Diagnosis Date  . Coronary artery disease    a. 02/2017 NSTEMI/Cath: LM 25, LAD 25ost, 70p, 38m, 70/90d, D1/2 mod dzs, RI 80, LCX 28m, OM1 small, OM2 mod dzs, RCA 100p, RPDA fills via L->R collats-->Not felt to be a good surgical candidate-->Med Rx; b. 03/2017 NSTEMI/Cath: LM 25, LAD 25ost, 70p, 74m, 70/90d, D1 70ost, D2 80ost, RI 80, LCX 80p->d, OM1 90, RCA 100p, RPDA fills from L->R collats-->Med Rx.  . ETOH abuse   . GERD (gastroesophageal reflux disease)    a. x 20 yrs  . Gout   . HFrEF (heart failure with reduced ejection fraction) (HCC)    a. 02/2017 Echo: EF 20-25%, diff HK, mild  Bradley, mildly dil RA, mild TR.  Marland Kitchen Hypertension    a. x 20 yrs - no meds since 2017.  . Ischemic cardiomyopathy    a. 02/2017 Echo: EF 20-25%, diff HK.  . NSTEMI (non-ST elevated myocardial infarction) (HCC)    a. 02/2017--sev 3VD-->turned down for CABG-->Med Rx.  . Obesity   . Tobacco abuse    Past Surgical History:  Procedure Laterality Date  . LEFT HEART CATH AND CORONARY ANGIOGRAPHY N/A 02/27/2017   Procedure: LEFT HEART CATH AND CORONARY ANGIOGRAPHY;  Surgeon: Yvonne Kendall, MD;  Location: [facility] INVASIVE CV LAB;  Service: Cardiovascular;  Laterality: N/A;  . LEFT HEART CATH AND CORONARY ANGIOGRAPHY N/A 04/09/2017   Procedure: LEFT HEART CATH AND CORONARY ANGIOGRAPHY;  Surgeon: Iran Ouch, MD;  Location: [facility] INVASIVE CV LAB;  Service: Cardiovascular;  Laterality: N/A;   Family History  Problem Relation Age of Onset  . Rheum arthritis Mother   . CAD Father        First MI in his 39's.  Died in his 58's  . Other Father        liver failure  . Diabetes Sister   . CAD Brother        s/p stenting   Social History   Tobacco Use  . Smoking status: Current Every Day Smoker    Packs/day: 0.50    Years: 30.00    Pack years: 15.00    Types: Cigarettes  . Smokeless tobacco: Never Used  Substance Use Topics  . Alcohol use: Yes  Comment: 1/5th of Vodka every weekend- states he quit 03/15/17   Allergies  Allergen Reactions  . Penicillins Rash    Has patient had a PCN reaction causing immediate rash, facial/tongue/throat swelling, SOB or lightheadedness with hypotension: Yes Has patient had a PCN reaction causing severe rash involving mucus membranes or skin necrosis: No Has patient had a PCN reaction that required hospitalization: No Has patient had a PCN reaction occurring within the last 10 years: No If all of the above answers are "NO", then may proceed with Cephalosporin use.   Prior to Admission medications   Medication Sig Start Date End Date Taking? Authorizing  Provider  acetaminophen (TYLENOL) 500 MG tablet Take 1,000 mg by mouth every 4 (four) hours as needed.   Yes [provider]  aspirin 81 MG EC tablet Take 1 tablet (81 mg total) by mouth daily. 05/10/17  Yes Clarisa Kindred A, FNP  atorvastatin (LIPITOR) 80 MG tablet Take 1 tablet (80 mg total) by mouth daily at 6 PM. 05/10/17  Yes Clarisa Kindred A, FNP  carvedilol (COREG) 25 MG tablet Take 1 tablet (25 mg total) by mouth 2 (two) times daily. 07/25/17 10/23/17 Yes End, Cristal Deer, MD  clopidogrel (PLAVIX) 75 MG tablet Take 1 tablet (75 mg total) by mouth daily. 05/10/17  Yes Clarisa Kindred A, FNP  furosemide (LASIX) 20 MG tablet Take 1 tablet (20 mg total) by mouth daily. 05/21/17 08/19/17 Yes Creig Hines, NP  isosorbide mononitrate (IMDUR) 30 MG 24 hr tablet Take 1 tablet (30 mg total) by mouth daily. 05/10/17  Yes Hackney, Inetta Fermo A, FNP  nitroGLYCERIN (NITROSTAT) 0.4 MG SL tablet Place 1 tablet (0.4 mg total) under the tongue every 5 (five) minutes x 3 doses as needed for chest pain. 05/10/17  Yes Clarisa Kindred A, FNP  omeprazole (PRILOSEC OTC) 20 MG tablet Take 1 tablet (20 mg total) by mouth daily. 05/10/17  Yes Hackney, Tina A, FNP  sacubitril-valsartan (ENTRESTO) 24-26 MG Take 1 tablet by mouth 2 (two) times daily. 06/22/17  Yes Clarisa Kindred A, FNP  spironolactone (ALDACTONE) 25 MG tablet Take 1 tablet (25 mg total) by mouth daily. 05/10/17 05/10/18 Yes Clarisa Kindred A, FNP  ezetimibe (ZETIA) 10 MG tablet Take 1 tablet (10 mg total) by mouth daily. Patient not taking: Reported on 07/25/2017 06/04/17 09/02/17  Creig Hines, NP  nicotine (NICODERM CQ - DOSED IN MG/24 HOURS) 14 mg/24hr patch Place 1 patch (14 mg total) onto the skin daily. 07/25/17   End, Cristal Deer, MD   Review of Systems  Constitutional: Positive for fatigue. Negative for appetite change.  HENT: Positive for congestion. Negative for postnasal drip and sore throat.   Eyes: Negative.   Respiratory: Positive  for shortness of breath (with heavy exertion). Negative for cough and chest tightness.   Cardiovascular: Negative for chest pain, palpitations and leg swelling.  Gastrointestinal: Negative for abdominal distention and abdominal pain.  Endocrine: Negative.   Genitourinary: Negative.   Musculoskeletal: Positive for arthralgias (gout in hands/fingers). Negative for back pain.  Skin: Negative.   Allergic/Immunologic: Negative.   Neurological: Positive for light-headedness (on occasion). Negative for dizziness.  Hematological: Negative for adenopathy. Does not bruise/bleed easily.  Psychiatric/Behavioral: Positive for sleep disturbance (somedays sleep well and sometimes not). Negative for dysphoric mood. The patient is not nervous/anxious.    Vitals:   08/01/17 1304  BP: (!) 155/71  Pulse: 87  Resp: 18  Temp: 97.6 F (36.4 C)  TempSrc: Oral  SpO2: 100%  Weight: 242  lb 8 oz (110 kg)  Height: 6\' 1"  (1.854 m)   Wt Readings from Last 3 Encounters:  08/01/17 242 lb 8 oz (110 kg)  07/25/17 245 lb (111.1 kg)  06/22/17 241 lb 4 oz (109.4 kg)   Lab Results  Component Value Date   CREATININE 1.20 07/25/2017   CREATININE 1.19 06/01/2017   CREATININE 1.20 04/25/2017    Physical Exam  Constitutional: He is oriented to person, place, and time. He appears well-developed and well-nourished.  HENT:  Head: Normocephalic and atraumatic.  Neck: Normal range of motion. Neck supple. No JVD present.  Cardiovascular: Normal rate and regular rhythm.  Pulmonary/Chest: Effort normal. He has no wheezes. He has no rales.  Abdominal: Soft. He exhibits no distension. There is no tenderness.  Musculoskeletal: He exhibits no edema or tenderness.  Neurological: He is alert and oriented to person, place, and time.  Skin: Skin is warm and dry.  Psychiatric: He has a normal mood and affect. His behavior is normal. Thought content normal.  Nursing note and vitals reviewed.  Assessment & Plan:  1: Chronic  heart failure with reduced ejection fraction- - NYHA class II - euvolemic today - weighing daily. Reminded to call for an overnight weight gain of >2 pounds or a weekly weight gain of >5 pounds - weight down 3 pounds from 07/25/17 - does add salt to eggs, tomatoes and potatoes. Reviewed the importance of not adding any salt to his food and closely following a 2000mg  sodium diet.   - drinking 2 cups coffee, 3-4 glasses of [facility] [place] [place] and 32 ounces of water daily - saw cardiology (End) 07/25/17 & carvedilol was increased to 25mg  BID - will increase entresto to 49/51mg  BID. Advised patient that he could finish his current dose by taking 2 tablets twice daily until gone and then begin the 49/51mg  dose and then it'll be 1 tablet twice daily - sees cardiology 09/04/17 so will see if they'll check a BMP at that visit  2: HTN- - BP mildly elevated today - planning on getting established at [facility] - BMP from 07/25/17 reviewed and shows sodium 136, potassium 4.7 and GFR 70 - increasing entresto per above  3: CAD- - saw cardiothoracic surgeon Laneta Simmers([place]) 05/30/17 and CABG was not recommended  4: Tobacco use- - currently smoking ~ 1ppd of cigarettes & has recently gotten a prescription for nicotine patches but he hasn't gotten it filled yet - had stopped 3 times previously - complete cessation discussed for 3 minutes with him  Medication list was reviewed.  Since he sees cardiology April 2019 will have patient return here May 2019 or sooner for any questions/problems before then.

## 2017-08-01 ENCOUNTER — Other Ambulatory Visit: Payer: Self-pay

## 2017-08-01 ENCOUNTER — Ambulatory Visit: Payer: Medicaid Other | Attending: Family | Admitting: Family

## 2017-08-01 ENCOUNTER — Encounter: Payer: Self-pay | Admitting: Family

## 2017-08-01 VITALS — BP 155/71 | HR 87 | Temp 97.6°F | Resp 18 | Ht 73.0 in | Wt 242.5 lb

## 2017-08-01 DIAGNOSIS — F17210 Nicotine dependence, cigarettes, uncomplicated: Secondary | ICD-10-CM | POA: Insufficient documentation

## 2017-08-01 DIAGNOSIS — Z833 Family history of diabetes mellitus: Secondary | ICD-10-CM | POA: Insufficient documentation

## 2017-08-01 DIAGNOSIS — Z955 Presence of coronary angioplasty implant and graft: Secondary | ICD-10-CM | POA: Diagnosis not present

## 2017-08-01 DIAGNOSIS — Z8249 Family history of ischemic heart disease and other diseases of the circulatory system: Secondary | ICD-10-CM | POA: Insufficient documentation

## 2017-08-01 DIAGNOSIS — I5022 Chronic systolic (congestive) heart failure: Secondary | ICD-10-CM

## 2017-08-01 DIAGNOSIS — Z6831 Body mass index (BMI) 31.0-31.9, adult: Secondary | ICD-10-CM | POA: Diagnosis not present

## 2017-08-01 DIAGNOSIS — E669 Obesity, unspecified: Secondary | ICD-10-CM | POA: Insufficient documentation

## 2017-08-01 DIAGNOSIS — Z8379 Family history of other diseases of the digestive system: Secondary | ICD-10-CM | POA: Insufficient documentation

## 2017-08-01 DIAGNOSIS — Z79899 Other long term (current) drug therapy: Secondary | ICD-10-CM | POA: Diagnosis not present

## 2017-08-01 DIAGNOSIS — Z880 Allergy status to penicillin: Secondary | ICD-10-CM | POA: Diagnosis not present

## 2017-08-01 DIAGNOSIS — Z720 Tobacco use: Secondary | ICD-10-CM

## 2017-08-01 DIAGNOSIS — Z7902 Long term (current) use of antithrombotics/antiplatelets: Secondary | ICD-10-CM | POA: Insufficient documentation

## 2017-08-01 DIAGNOSIS — I252 Old myocardial infarction: Secondary | ICD-10-CM | POA: Insufficient documentation

## 2017-08-01 DIAGNOSIS — M109 Gout, unspecified: Secondary | ICD-10-CM | POA: Diagnosis not present

## 2017-08-01 DIAGNOSIS — K219 Gastro-esophageal reflux disease without esophagitis: Secondary | ICD-10-CM | POA: Diagnosis not present

## 2017-08-01 DIAGNOSIS — Z7982 Long term (current) use of aspirin: Secondary | ICD-10-CM | POA: Diagnosis not present

## 2017-08-01 DIAGNOSIS — Z951 Presence of aortocoronary bypass graft: Secondary | ICD-10-CM | POA: Insufficient documentation

## 2017-08-01 DIAGNOSIS — I110 Hypertensive heart disease with heart failure: Secondary | ICD-10-CM | POA: Insufficient documentation

## 2017-08-01 DIAGNOSIS — Z8261 Family history of arthritis: Secondary | ICD-10-CM | POA: Diagnosis not present

## 2017-08-01 DIAGNOSIS — I2510 Atherosclerotic heart disease of native coronary artery without angina pectoris: Secondary | ICD-10-CM | POA: Diagnosis not present

## 2017-08-01 DIAGNOSIS — I255 Ischemic cardiomyopathy: Secondary | ICD-10-CM | POA: Insufficient documentation

## 2017-08-01 DIAGNOSIS — I10 Essential (primary) hypertension: Secondary | ICD-10-CM

## 2017-08-01 MED ORDER — SACUBITRIL-VALSARTAN 49-51 MG PO TABS: 1.0000 | ORAL_TABLET | Freq: Two times a day (BID) | ORAL | 3 refills | Status: DC

## 2017-08-01 NOTE — Patient Instructions (Addendum)
Continue weighing daily and call for an overnight weight gain of > 2 pounds or a weekly weight gain of >5 pounds.  Increase your entresto by taking 2 tablets (24/26mg ) twice daily until they are gone. Then get the new prescription of 49/51mg  picked up at [facility] and then you'll take 1 pill twice daily.

## 2017-08-02 ENCOUNTER — Telehealth: Payer: Self-pay | Admitting: Pharmacist

## 2017-08-02 NOTE — Telephone Encounter (Signed)
08/02/2017 10:11:19 AM - Colcrys 0.6  08/02/17 Received pharmacy printout for Colcrys 0.6mg  Take 2 tablets by mouth at first sign of Gout Flare, then after 1 hours take one tablet. Printed [employer] application, mailing patient his portion to sign & return, also mailing provider Ubaldo GlassingNicole Spencer,NP @ [facility] her portion to sign & return.Forde RadonAJ

## 2017-08-03 ENCOUNTER — Telehealth: Payer: Self-pay | Admitting: Internal Medicine

## 2017-08-03 NOTE — Telephone Encounter (Signed)
Spoke with pt and his pharmacy. The pharmacy does not carry the nicotine patches ordered, however they can order the Nicotrole Inhaler. The pharmacy advised the patient to fill out a form and contact [facility] who can send his nicotine patches in the mail for free. She stated it would take the same amount of time to get his medication in either way.  I called the patient back and advised him to follow up with [place] [facility] and to be sure he answer their call to get set up with his patches. He stated he would follow up and had no further questions.

## 2017-08-03 NOTE — Telephone Encounter (Signed)
Pt states his pharmacy is unable to get the nicotine patches that was prescribed for him last week. Please call reagarding alternative.

## 2017-08-07 ENCOUNTER — Telehealth: Payer: Self-pay | Admitting: Pharmacist

## 2017-08-07 NOTE — Telephone Encounter (Signed)
08/07/2017 1:55:51 PM - Zetia  08/07/17 I have received a note to follow up on patient's Zetia 10mg  that I mailed [employer] application on 06/14/17. I called [employer] spoke with Verlon AuLeslie they have No record of receiving this application, this patient is not in their system. She got permission from supervisor for me to fax to her Attn @ [phone]. I have faxed for them to process.Forde RadonAJ

## 2017-08-27 ENCOUNTER — Telehealth: Payer: Self-pay | Admitting: Pharmacist

## 2017-08-27 NOTE — Telephone Encounter (Signed)
08/27/2017 10:36:21 AM - colcrys  08/27/17 Faxed [employer] application for enrollment-Colcrys 0.6mg  Take 2 tablet by mouth at first sign of Gout Flare, then after 1 hours take one tablet.Forde RadonAJ

## 2017-08-28 ENCOUNTER — Telehealth: Payer: Self-pay

## 2017-08-28 NOTE — Telephone Encounter (Signed)
Contacted the office and left a voicemail stating he had a question on his medication. I attempted to reach patient but was unable to. I left a message on his voicemail to contact the office back

## 2017-08-29 NOTE — Telephone Encounter (Signed)
Mr. Bradley Bridges contacted the office to request a refill on his omeprazole. I advised that I would put a request in for [name] but he may need to have this medication filled by [facility] at [facility].

## 2017-09-03 ENCOUNTER — Other Ambulatory Visit: Payer: Self-pay

## 2017-09-03 DIAGNOSIS — Z79899 Other long term (current) drug therapy: Secondary | ICD-10-CM

## 2017-09-04 ENCOUNTER — Ambulatory Visit: Payer: Self-pay | Admitting: Nurse Practitioner

## 2017-09-04 ENCOUNTER — Telehealth: Payer: Self-pay | Admitting: Pharmacist

## 2017-09-04 NOTE — Telephone Encounter (Signed)
09/04/2017 2:01:40 PM - Sherryll BurgerEntresto 49/51 Dose Increase  09/04/17 Today I received a pharmacy printout for Dose Increase--Entresto 49/51 Take 1 tablet by mouth two times a day. I have printed script and page 3 of application, taking to [facility] Hackney to sign. Patient already enrolled till 07/02/2018 with [employer] ID# [id number].Forde RadonAJ

## 2017-09-21 ENCOUNTER — Telehealth: Payer: Self-pay | Admitting: Pharmacist

## 2017-09-21 NOTE — Telephone Encounter (Signed)
09/21/2017 11:21:10 AM - Sherryll Burger 49/51 script to [facility]  09/21/17 Faxed script & page 3 of application to [employer] for Dose Increase-Entresto 49/51 Take one tablet by mouth two times a day.Forde Radon

## 2017-09-30 NOTE — Progress Notes (Deleted)
Patient ID: Bradley Bridges, male    DOB: December 14, 1965, 52 y.o.   MRN: [id number]  HPI  Bradley Bridges is a 52 y/o male with a history of CAD, alcohol use, GERD, HTN, NSTEMI, current tobacco use and chronic heart failure.   Echo report from 02/28/17 reviewed and showed an EF of 20-25% along with mild Bradley/TR. EF in 2009 was 55%. Cardiac catheterization done 04/09/17 showed severe three vessel CAD with no significant change in coronary anatomy since previous catheterization. Well-developed collaterals from the LAD to the right RCA. Mildly elevated left ventricular end-diastolic pressure. Referral considered back to cardiothoracic surgery for CABG.   Admitted 04/06/17 due to NSTEMI. Cardiology consult was obtained. Catheterization done which showed multivessel disease. Initially needed heparin drip and NTG drip. Discharged after 4 days.    Patient presents today for a follow-up visit with a chief complaint of   Past Medical History:  Diagnosis Date  . Coronary artery disease    a. 02/2017 NSTEMI/Cath: LM 25, LAD 25ost, 70p, 61m, 70/90d, D1/2 mod dzs, RI 80, LCX 59m, OM1 small, OM2 mod dzs, RCA 100p, RPDA fills via L->R collats-->Not felt to be a good surgical candidate-->Med Rx; b. 03/2017 NSTEMI/Cath: LM 25, LAD 25ost, 70p, 43m, 70/90d, D1 70ost, D2 80ost, RI 80, LCX 80p->d, OM1 90, RCA 100p, RPDA fills from L->R collats-->Med Rx.  . ETOH abuse   . GERD (gastroesophageal reflux disease)    a. x 20 yrs  . Gout   . HFrEF (heart failure with reduced ejection fraction) (HCC)    a. 02/2017 Echo: EF 20-25%, diff HK, mild Bradley, mildly dil RA, mild TR.  Marland Kitchen Hypertension    a. x 20 yrs - no meds since 2017.  . Ischemic cardiomyopathy    a. 02/2017 Echo: EF 20-25%, diff HK.  . NSTEMI (non-ST elevated myocardial infarction) (HCC)    a. 02/2017--sev 3VD-->turned down for CABG-->Med Rx.  . Obesity   . Tobacco abuse    Past Surgical History:  Procedure Laterality Date  . LEFT HEART CATH AND CORONARY  ANGIOGRAPHY N/A 02/27/2017   Procedure: LEFT HEART CATH AND CORONARY ANGIOGRAPHY;  Surgeon: Bradley Kendall, MD;  Location: [facility] INVASIVE CV LAB;  Service: Cardiovascular;  Laterality: N/A;  . LEFT HEART CATH AND CORONARY ANGIOGRAPHY N/A 04/09/2017   Procedure: LEFT HEART CATH AND CORONARY ANGIOGRAPHY;  Surgeon: Bradley Ouch, MD;  Location: [facility] INVASIVE CV LAB;  Service: Cardiovascular;  Laterality: N/A;   Family History  Problem Relation Age of Onset  . Rheum arthritis Mother   . CAD Father        First MI in his 61's.  Died in his 68's  . Other Father        liver failure  . Diabetes Sister   . CAD Brother        s/p stenting   Social History   Tobacco Use  . Smoking status: Current Every Day Smoker    Packs/day: 0.50    Years: 30.00    Pack years: 15.00    Types: Cigarettes  . Smokeless tobacco: Never Used  Substance Use Topics  . Alcohol use: Yes    Comment: 1/5th of Vodka every weekend- states he quit 03/15/17   Allergies  Allergen Reactions  . Penicillins Rash    Has patient had a PCN reaction causing immediate rash, facial/tongue/throat swelling, SOB or lightheadedness with hypotension: Yes Has patient had a PCN reaction causing severe rash involving mucus membranes or skin necrosis: No  Has patient had a PCN reaction that required hospitalization: No Has patient had a PCN reaction occurring within the last 10 years: No If all of the above answers are "NO", then may proceed with Cephalosporin use.    Review of Systems  Constitutional: Positive for fatigue. Negative for appetite change.  HENT: Positive for congestion. Negative for postnasal drip and sore throat.   Eyes: Negative.   Respiratory: Positive for shortness of breath (with heavy exertion). Negative for cough and chest tightness.   Cardiovascular: Negative for chest pain, palpitations and leg swelling.  Gastrointestinal: Negative for abdominal distention and abdominal pain.  Endocrine: Negative.    Genitourinary: Negative.   Musculoskeletal: Positive for arthralgias (gout in hands/fingers). Negative for back pain.  Skin: Negative.   Allergic/Immunologic: Negative.   Neurological: Positive for light-headedness (on occasion). Negative for dizziness.  Hematological: Negative for adenopathy. Does not bruise/bleed easily.  Psychiatric/Behavioral: Positive for sleep disturbance (somedays sleep well and sometimes not). Negative for dysphoric mood. The patient is not nervous/anxious.      Physical Exam  Constitutional: He is oriented to person, place, and time. He appears well-developed and well-nourished.  HENT:  Head: Normocephalic and atraumatic.  Neck: Normal range of motion. Neck supple. No JVD present.  Cardiovascular: Normal rate and regular rhythm.  Pulmonary/Chest: Effort normal. He has no wheezes. He has no rales.  Abdominal: Soft. He exhibits no distension. There is no tenderness.  Musculoskeletal: He exhibits no edema or tenderness.  Neurological: He is alert and oriented to person, place, and time.  Skin: Skin is warm and dry.  Psychiatric: He has a normal mood and affect. His behavior is normal. Thought content normal.  Nursing note and vitals reviewed.  Assessment & Plan:  1: Chronic heart failure with reduced ejection fraction- - NYHA class II - euvolemic today - weighing daily. Reminded to call for an overnight weight gain of >2 pounds or a weekly weight gain of >5 pounds - weight down 3 pounds from 07/25/17 - does add salt to eggs, tomatoes and potatoes. Reviewed the importance of not adding any salt to his food and closely following a  sodium diet.   - drinking 2 cups coffee, 3-4 glasses of [employer] and 32 ounces of water daily - saw cardiology (End) 07/25/17  - will check a BMP today since entresto was increased at his last visit  2: HTN- - BP mildly elevated today - planning on getting established at [facility] - BMP from 07/25/17 reviewed and  shows sodium 136, potassium 4.7 and GFR 70 -   3: CAD- - saw cardiothoracic surgeon Bradley Bridges) 05/30/17 and CABG was not recommended  4: Tobacco use- - currently smoking ~ 1ppd of cigarettes & has recently gotten a prescription for nicotine patches but he hasn't gotten it filled yet - had stopped 3 times previously - complete cessation discussed for 3 minutes with him  Medication list was reviewed.

## 2017-10-01 ENCOUNTER — Other Ambulatory Visit: Payer: Self-pay | Admitting: Nurse Practitioner

## 2017-10-02 ENCOUNTER — Encounter: Payer: Self-pay | Admitting: Nurse Practitioner

## 2017-10-02 ENCOUNTER — Ambulatory Visit (INDEPENDENT_AMBULATORY_CARE_PROVIDER_SITE_OTHER): Payer: Medicaid Other | Admitting: Nurse Practitioner

## 2017-10-02 VITALS — BP 150/84 | HR 78 | Ht 73.0 in | Wt 252.8 lb

## 2017-10-02 DIAGNOSIS — I10 Essential (primary) hypertension: Secondary | ICD-10-CM | POA: Diagnosis not present

## 2017-10-02 DIAGNOSIS — I5022 Chronic systolic (congestive) heart failure: Secondary | ICD-10-CM

## 2017-10-02 DIAGNOSIS — I2510 Atherosclerotic heart disease of native coronary artery without angina pectoris: Secondary | ICD-10-CM | POA: Diagnosis not present

## 2017-10-02 DIAGNOSIS — E785 Hyperlipidemia, unspecified: Secondary | ICD-10-CM

## 2017-10-02 DIAGNOSIS — Z720 Tobacco use: Secondary | ICD-10-CM

## 2017-10-02 DIAGNOSIS — I255 Ischemic cardiomyopathy: Secondary | ICD-10-CM | POA: Diagnosis not present

## 2017-10-02 NOTE — Patient Instructions (Signed)
Medication Instructions:  Your physician recommends that you continue on your current medications as directed. Please refer to the Current Medication list given to you today.   Labwork: none  Testing/Procedures: none  Follow-Up: Your physician recommends that you schedule a follow-up appointment in: 6 WEEKS WITH TINA HACKNEY. Please reschedule.   Your physician recommends that you schedule a follow-up appointment in: 3 MONTHS WITH DR END.    If you need a refill on your cardiac medications before your next appointment, please call your pharmacy.

## 2017-10-02 NOTE — Progress Notes (Signed)
Office Visit    Patient Name: Jhaden Pizzuto Date of Encounter: 10/02/2017  Primary Care Provider:  [facility] Primary Cardiologist:  Yvonne Kendall, MD  Chief Complaint    52 y/o ? with a history of CAD, ischemic cardiomyopathy, HFrEF, ongoing tobacco abuse, hypertension, hyperlipidemia, obesity, and remote alcohol abuse, who presents for follow-up.  Past Medical History    Past Medical History:  Diagnosis Date  . Coronary artery disease    a. 02/2017 NSTEMI/Cath: LM 25, LAD 25ost, 70p, 52m, 70/90d, D1/2 mod dzs, RI 80, LCX 46m, OM1 small, OM2 mod dzs, RCA 100p, RPDA fills via L->R collats-->Not felt to be a good surgical candidate-->Med Rx; b. 03/2017 NSTEMI/Cath: LM 25, LAD 25ost, 70p, 93m, 70/90d, D1 70ost, D2 80ost, RI 80, LCX 80p->d, OM1 90, RCA 100p, RPDA fills from L->R collats-->Med Rx.  . ETOH abuse   . GERD (gastroesophageal reflux disease)    a. x 20 yrs  . Gout   . HFrEF (heart failure with reduced ejection fraction) (HCC)    a. 02/2017 Echo: EF 20-25%, diff HK, mild MR, mildly dil RA, mild TR.  Marland Kitchen Hypertension    a. x 20 yrs - no meds since 2017.  . Ischemic cardiomyopathy    a. 02/2017 Echo: EF 20-25%, diff HK.  . NSTEMI (non-ST elevated myocardial infarction) (HCC)    a. 02/2017--sev 3VD-->turned down for CABG-->Med Rx.  . Obesity   . Tobacco abuse    Past Surgical History:  Procedure Laterality Date  . LEFT HEART CATH AND CORONARY ANGIOGRAPHY N/A 02/27/2017   Procedure: LEFT HEART CATH AND CORONARY ANGIOGRAPHY;  Surgeon: Yvonne Kendall, MD;  Location: [facility] INVASIVE CV LAB;  Service: Cardiovascular;  Laterality: N/A;  . LEFT HEART CATH AND CORONARY ANGIOGRAPHY N/A 04/09/2017   Procedure: LEFT HEART CATH AND CORONARY ANGIOGRAPHY;  Surgeon: Iran Ouch, MD;  Location: [facility] INVASIVE CV LAB;  Service: Cardiovascular;  Laterality: N/A;    Allergies  Allergies  Allergen Reactions  . Penicillins Rash    Has patient had a PCN  reaction causing immediate rash, facial/tongue/throat swelling, SOB or lightheadedness with hypotension: Yes Has patient had a PCN reaction causing severe rash involving mucus membranes or skin necrosis: No Has patient had a PCN reaction that required hospitalization: No Has patient had a PCN reaction occurring within the last 10 years: No If all of the above answers are "NO", then may proceed with Cephalosporin use.    History of Present Illness    52 year old male with the above complex past medical history including hypertension, obesity, tobacco abuse, and alcohol abuse.  In October 2018, he was admitted to [facility] with late presenting STEMI.  Catheterization revealed significant multivessel disease and he was transferred to [facility] for CT surgical evaluation, but was felt to be a poor candidate secondary tobacco abuse and LV dysfunction with an EF of 20 to 25%.  Medical therapy was recommended and he was subsequently readmitted to [facility] in November 2018 with non-STEMI and peak troponin of 10.26.  Repeat cath revealed no significant change in coronary anatomy since prior catheterization though collaterals from the LAD to the distal RCA were felt to be more well-developed.  Again medical therapy was recommended.  He was referred back to thoracic surgery in January 2019 and due to ongoing comorbidities and poor targets for grafting, he again was not felt to be a candidate for bypass.  He has been followed closely here and also in heart  failure clinic.  He continues to smoke cigarettes but reports compliance with his medications.  He is chronic, stable dyspnea on exertion.  He has had occasional orthostatic lightheadedness which he feels has worsened recently.  He has not been having any chest pain and denies palpitations, PND, orthopnea, syncope, edema, or early satiety.  Home Medications    Prior to Admission medications   Medication Sig Start Date End Date Taking? Authorizing  Provider  acetaminophen (TYLENOL) 500 MG tablet Take 1,000 mg by mouth every 4 (four) hours as needed.   Yes [provider]  aspirin 81 MG EC tablet Take 1 tablet (81 mg total) by mouth daily. 05/10/17  Yes Clarisa Kindred A, FNP  atorvastatin (LIPITOR) 80 MG tablet Take 1 tablet (80 mg total) by mouth daily at 6 PM. 05/10/17  Yes Clarisa Kindred A, FNP  carvedilol (COREG) 25 MG tablet Take 1 tablet (25 mg total) by mouth 2 (two) times daily. 07/25/17 10/23/17 Yes End, Cristal Deer, MD  clopidogrel (PLAVIX) 75 MG tablet Take 1 tablet (75 mg total) by mouth daily. 05/10/17  Yes Clarisa Kindred A, FNP  ezetimibe (ZETIA) 10 MG tablet Take 1 tablet (10 mg total) by mouth daily. 06/04/17 10/02/17 Yes Creig Hines, NP  furosemide (LASIX) 20 MG tablet TAKE ONE TABLET BY MOUTH EVERY DAY 10/01/17  Yes Creig Hines, NP  isosorbide mononitrate (IMDUR) 30 MG 24 hr tablet Take 1 tablet (30 mg total) by mouth daily. 05/10/17  Yes Hackney, Tina A, FNP  nicotine (NICODERM CQ - DOSED IN MG/24 HOURS) 14 mg/24hr patch Place 1 patch (14 mg total) onto the skin daily. 07/25/17  Yes End, Cristal Deer, MD  nitroGLYCERIN (NITROSTAT) 0.4 MG SL tablet Place 1 tablet (0.4 mg total) under the tongue every 5 (five) minutes x 3 doses as needed for chest pain. 05/10/17  Yes Clarisa Kindred A, FNP  omeprazole (PRILOSEC OTC) 20 MG tablet Take 1 tablet (20 mg total) by mouth daily. 05/10/17  Yes Hackney, Tina A, FNP  sacubitril-valsartan (ENTRESTO) 49-51 MG Take 1 tablet by mouth 2 (two) times daily. 08/01/17  Yes Clarisa Kindred A, FNP  spironolactone (ALDACTONE) 25 MG tablet Take 1 tablet (25 mg total) by mouth daily. 05/10/17 05/10/18 Yes Delma Freeze, FNP    Review of Systems    Stable, chronic dyspnea on exertion without recent worsening.  He has been experiencing an increase in frequency of orthostatic lightheadedness.  He denies palpitations, PND, orthopnea, chest pain, syncope, edema, or early satiety.  All  other systems reviewed and are otherwise negative except as noted above.  Physical Exam    VS:  BP (!) 150/84 (BP Location: Left Arm, Patient Position: Sitting, Cuff Size: Normal)   Pulse 78   Ht  (1.854 m)   Wt 252 lb 12 oz (114.6 kg)   BMI 33.35 kg/m  , BMI Body mass index is 33.35 kg/m.  Blood pressure lying 140/86, heart rate 66 Blood pressure sitting 140/90, heart rate 73 Blood pressure standing-0 minutes 144/90, heart rate 76 Blood pressure standing-3 minutes 140/90, heart rate 78 Asymptomatic throughout orthostatic vital signs. GEN: Obese, in no acute distress.  HEENT: normal.  Neck: Supple, no JVD, carotid bruits, or masses. Cardiac: RRR, no murmurs, rubs, or gallops. No clubbing, cyanosis, edema.  Radials/DP/PT 2+ and equal bilaterally.  Respiratory:  Respirations regular and unlabored, clear to auscultation bilaterally. GI: Obese, soft, nontender, nondistended, BS + x 4. MS: no deformity or atrophy. Skin: warm and dry, no  rash. Neuro:  Strength and sensation are intact. Psych: Normal affect.  Accessory Clinical Findings    ECG -regular sinus rhythm, 78, PAC, inferior infarct, no acute changes.  Labs from [facility] dated September 03, 2017 Sodium 141, potassium 4.2, chloride 101, CO2 24, BUN 15, creatinine 1.20, calcium 9.6, total protein 7.4, albumin 4.5, total bilirubin 0.3, alkaline phosphatase 58, AST 15, ALT 15 Total cholesterol 136, triglycerides 189, HDL 31, LDL 67 TSH 2.680 Uric acid 8.6  Assessment & Plan    1.  Coronary artery disease: Known, severe multivessel CAD.  He has been evaluated by CT surgery twice and is not felt to be a good candidate secondary to poor grafting options and ongoing comorbidity.  He has not been having any chest pain.  He remains on aspirin, statin, beta-blocker, Plavix, Zetia, and nitrate therapy.  2.  Ischemic cardiomyopathy/chronic HFrEF: His weight is up on our scale at 252 pounds.  He was 245 when last  seen in our clinic in March and 242 when last seen in heart failure clinic a week later.  He says his weight at home has been running about 249 on his scale.  Despite increase in weight, he is euvolemic on exam.  Symptoms have been stable.  He is very sedentary and I have encouraged increase activity as tolerated.  His blood pressure is elevated today at 150/84 however, he reports orthostatic symptoms at home, frequently getting lightheaded when up and about, bending, or stooping.  He was not orthostatic by exam today.  Given symptoms, I will not titrate Entresto at this time.  I have asked him to check his blood pressure when he is feeling lightheaded at home, as he does own a cuff.  I have asked him to bring this information with him when he follows up in heart failure clinic in about 6 weeks, at which point we can reconsider further titration of Entresto.  He otherwise will remain on carvedilol, low-dose Lasix, and Spironolactone therapy.  Once he is on maximally tolerated heart failure therapy, we can plan to repeat echo to reevaluate LV function and determine candidacy for ICD.  3.  Essential hypertension: As above, elevated today however, he has been having orthostatic symptoms at home.  He was not orthostatic on examination today.  He will check his blood pressure at home during symptoms of lightheadedness to help US guide decision making with regards to further titration of Entresto.  4.  Hyperlipidemia: LDL 67 on recent evaluation in April with normal LFTs.  Continue statin and Zetia therapy.  5.  Tobacco abuse: Still smoking.  He has patches at home but says he is not currently interested in quitting.  Complete cessation advised.  6.  History of alcohol abuse: He says he has not been drinking regularly but did get drunk on his birthday and actually fell in his bathroom and struck his head on a countertop.  Fortunately, he did not have any significant trauma.  He says he has not had a drink since  then.  We discussed the importance of avoiding alcohol in the setting of severe heart failure.  7.  Disposition: Follow-up in heart failure clinic in approximately 6 weeks.  Follow-up here in approximately 3 months.   Nicolasa Ducking, NP 10/02/2017, 3:26 PM

## 2017-10-03 ENCOUNTER — Ambulatory Visit: Payer: Self-pay | Admitting: Family

## 2017-11-16 ENCOUNTER — Encounter: Payer: Self-pay | Admitting: Family

## 2017-11-16 ENCOUNTER — Ambulatory Visit: Payer: Medicaid Other | Attending: Family | Admitting: Family

## 2017-11-16 VITALS — BP 151/83 | HR 70 | Resp 18 | Ht 73.0 in | Wt 254.1 lb

## 2017-11-16 DIAGNOSIS — Z833 Family history of diabetes mellitus: Secondary | ICD-10-CM | POA: Insufficient documentation

## 2017-11-16 DIAGNOSIS — R42 Dizziness and giddiness: Secondary | ICD-10-CM | POA: Diagnosis not present

## 2017-11-16 DIAGNOSIS — Z9889 Other specified postprocedural states: Secondary | ICD-10-CM | POA: Diagnosis not present

## 2017-11-16 DIAGNOSIS — Z880 Allergy status to penicillin: Secondary | ICD-10-CM | POA: Insufficient documentation

## 2017-11-16 DIAGNOSIS — K219 Gastro-esophageal reflux disease without esophagitis: Secondary | ICD-10-CM | POA: Diagnosis not present

## 2017-11-16 DIAGNOSIS — Z8379 Family history of other diseases of the digestive system: Secondary | ICD-10-CM | POA: Diagnosis not present

## 2017-11-16 DIAGNOSIS — I5022 Chronic systolic (congestive) heart failure: Secondary | ICD-10-CM

## 2017-11-16 DIAGNOSIS — I252 Old myocardial infarction: Secondary | ICD-10-CM | POA: Diagnosis not present

## 2017-11-16 DIAGNOSIS — Z7982 Long term (current) use of aspirin: Secondary | ICD-10-CM | POA: Insufficient documentation

## 2017-11-16 DIAGNOSIS — I110 Hypertensive heart disease with heart failure: Secondary | ICD-10-CM | POA: Insufficient documentation

## 2017-11-16 DIAGNOSIS — M109 Gout, unspecified: Secondary | ICD-10-CM | POA: Diagnosis not present

## 2017-11-16 DIAGNOSIS — Z6833 Body mass index (BMI) 33.0-33.9, adult: Secondary | ICD-10-CM | POA: Insufficient documentation

## 2017-11-16 DIAGNOSIS — Z720 Tobacco use: Secondary | ICD-10-CM | POA: Insufficient documentation

## 2017-11-16 DIAGNOSIS — I2510 Atherosclerotic heart disease of native coronary artery without angina pectoris: Secondary | ICD-10-CM | POA: Diagnosis not present

## 2017-11-16 DIAGNOSIS — Z8249 Family history of ischemic heart disease and other diseases of the circulatory system: Secondary | ICD-10-CM | POA: Insufficient documentation

## 2017-11-16 DIAGNOSIS — Z79899 Other long term (current) drug therapy: Secondary | ICD-10-CM | POA: Insufficient documentation

## 2017-11-16 DIAGNOSIS — Z09 Encounter for follow-up examination after completed treatment for conditions other than malignant neoplasm: Secondary | ICD-10-CM | POA: Diagnosis present

## 2017-11-16 DIAGNOSIS — Z8261 Family history of arthritis: Secondary | ICD-10-CM | POA: Insufficient documentation

## 2017-11-16 DIAGNOSIS — Z7902 Long term (current) use of antithrombotics/antiplatelets: Secondary | ICD-10-CM | POA: Diagnosis not present

## 2017-11-16 DIAGNOSIS — E669 Obesity, unspecified: Secondary | ICD-10-CM | POA: Diagnosis not present

## 2017-11-16 DIAGNOSIS — I10 Essential (primary) hypertension: Secondary | ICD-10-CM

## 2017-11-16 NOTE — Patient Instructions (Addendum)
Continue weighing daily and call for an overnight weight gain of > 2 pounds or a weekly weight gain of >5 pounds.  Have your PCP check your eyes and your ears at your next visit.    Smoking Cessation Quitting smoking is important to your health and has many advantages. However, it is not always easy to quit since nicotine is a very addictive drug. Oftentimes, people try 3 times or more before being able to quit. This document explains the best ways for you to prepare to quit smoking. Quitting takes hard work and a lot of effort, but you can do it. ADVANTAGES OF QUITTING SMOKING  You will live longer, feel better, and live better.  Your body will feel the impact of quitting smoking almost immediately.  Within 20 minutes, blood pressure decreases. Your pulse returns to its normal level.  After 8 hours, carbon monoxide levels in the blood return to normal. Your oxygen level increases.  After 24 hours, the chance of having a heart attack starts to decrease. Your breath, hair, and body stop smelling like smoke.  After 48 hours, damaged nerve endings begin to recover. Your sense of taste and smell improve.  After 72 hours, the body is virtually free of nicotine. Your bronchial tubes relax and breathing becomes easier.  After 2 to 12 weeks, lungs can hold more air. Exercise becomes easier and circulation improves.  The risk of having a heart attack, stroke, cancer, or lung disease is greatly reduced.  After 1 year, the risk of coronary heart disease is cut in half.  After 5 years, the risk of stroke falls to the same as a nonsmoker.  After 10 years, the risk of lung cancer is cut in half and the risk of other cancers decreases significantly.  After 15 years, the risk of coronary heart disease drops, usually to the level of a nonsmoker.  If you are pregnant, quitting smoking will improve your chances of having a healthy baby.  The people you live with, especially any children, will be  healthier.  You will have extra money to spend on things other than cigarettes. QUESTIONS TO THINK ABOUT BEFORE ATTEMPTING TO QUIT You may want to talk about your answers with your health care provider.  Why do you want to quit?  If you tried to quit in the past, what helped and what did not?  What will be the most difficult situations for you after you quit? How will you plan to handle them?  Who can help you through the tough times? Your family? Friends? A health care provider?  What pleasures do you get from smoking? What ways can you still get pleasure if you quit? Here are some questions to ask your health care provider:  How can you help me to be successful at quitting?  What medicine do you think would be best for me and how should I take it?  What should I do if I need more help?  What is smoking withdrawal like? How can I get information on withdrawal? GET READY  Set a quit date.  Change your environment by getting rid of all cigarettes, ashtrays, matches, and lighters in your home, car, or work. Do not let people smoke in your home.  Review your past attempts to quit. Think about what worked and what did not. GET SUPPORT AND ENCOURAGEMENT You have a better chance of being successful if you have help. You can get support in many ways.  Tell your family,  friends, and coworkers that you are going to quit and need their support. Ask them not to smoke around you.  Get individual, group, or telephone counseling and support. Programs are available at [employer] and health centers. Call your local health department for information about programs in your area.  Spiritual beliefs and practices may help some smokers quit.  Download a "quit meter" on your computer to keep track of quit statistics, such as how long you have gone without smoking, cigarettes not smoked, and money saved.  Get a self-help book about quitting smoking and staying off tobacco. Custer yourself from urges to smoke. Talk to someone, go for a walk, or occupy your time with a task.  Change your normal routine. Take a different route to work. Drink tea instead of coffee. Eat breakfast in a different place.  Reduce your stress. Take a hot bath, exercise, or read a book.  Plan something enjoyable to do every day. Reward yourself for not smoking.  Explore interactive web-based programs that specialize in helping you quit. GET MEDICINE AND USE IT CORRECTLY Medicines can help you stop smoking and decrease the urge to smoke. Combining medicine with the above behavioral methods and support can greatly increase your chances of successfully quitting smoking.  Nicotine replacement therapy helps deliver nicotine to your body without the negative effects and risks of smoking. Nicotine replacement therapy includes nicotine gum, lozenges, inhalers, nasal sprays, and skin patches. Some may be available over-the-counter and others require a prescription.  Antidepressant medicine helps people abstain from smoking, but how this works is unknown. This medicine is available by prescription.  Nicotinic receptor partial agonist medicine simulates the effect of nicotine in your brain. This medicine is available by prescription. Ask your health care provider for advice about which medicines to use and how to use them based on your health history. Your health care provider will tell you what side effects to look out for if you choose to be on a medicine or therapy. Carefully read the information on the package. Do not use any other product containing nicotine while using a nicotine replacement product.  RELAPSE OR DIFFICULT SITUATIONS Most relapses occur within the first 3 months after quitting. Do not be discouraged if you start smoking again. Remember, most people try several times before finally quitting. You may have symptoms of withdrawal because your body is used to nicotine.  You may crave cigarettes, be irritable, feel very hungry, cough often, get headaches, or have difficulty concentrating. The withdrawal symptoms are only temporary. They are strongest when you first quit, but they will go away within 10-14 days. To reduce the chances of relapse, try to:  Avoid drinking alcohol. Drinking lowers your chances of successfully quitting.  Reduce the amount of caffeine you consume. Once you quit smoking, the amount of caffeine in your body increases and can give you symptoms, such as a rapid heartbeat, sweating, and anxiety.  Avoid smokers because they can make you want to smoke.  Do not let weight gain distract you. Many smokers will gain weight when they quit, usually less than 10 pounds. Eat a healthy diet and stay active. You can always lose the weight gained after you quit.  Find ways to improve your mood other than smoking. FOR MORE INFORMATION  www.smokefree.gov  Document Released: 05/02/2001 Document Revised: 09/22/2013 Document Reviewed: 08/17/2011 [facility] Patient Information 2015 [place], [place]. This information is not intended to replace advice given to you by  your health care provider. Make sure you discuss any questions you have with your health care provider.  

## 2017-11-16 NOTE — Progress Notes (Signed)
Patient ID: Bradley Bridges, male    DOB: 07-02-1965, 52 y.o.   MRN: [id number]  HPI  Bradley Bridges is a 52 y/o male with a history of CAD, alcohol use, GERD, HTN, NSTEMI, current tobacco use and chronic heart failure.   Echo report from 02/28/17 reviewed and showed an EF of 20-25% along with mild Bradley/TR. EF in 2009 was 55%. Cardiac catheterization done 04/09/17 showed severe three vessel CAD with no significant change in coronary anatomy since previous catheterization. Well-developed collaterals from the LAD to the right RCA. Mildly elevated left ventricular end-diastolic pressure. Referral considered back to cardiothoracic surgery for CABG.   Has not been admitted or been in the ED in the last 6 months.   Patient presents today for a follow-up visit with a chief complaint of minimal shortness of breath upon moderate exertion. He describes this as chronic in nature having been present for several years. He has associated fatigue, dizziness, light-headedness, difficulty sleeping, gout pain in right hand and gradual weight gain. He denies any abdominal distention, palpitations, pedal edema, chest pain or cough. Says that his dizziness is worse in the mornings after drinking coffee and having a cigarette but also occurs throughout the day. Hasn't had his eyes checked in "a long time" and doesn't remember when his ears were looked at either.   Past Medical History:  Diagnosis Date  . Coronary artery disease    a. 02/2017 NSTEMI/Cath: LM 25, LAD 25ost, 70p, 33m, 70/90d, D1/2 mod dzs, RI 80, LCX 52m, OM1 small, OM2 mod dzs, RCA 100p, RPDA fills via L->R collats-->Not felt to be a good surgical candidate-->Med Rx; b. 03/2017 NSTEMI/Cath: LM 25, LAD 25ost, 70p, 38m, 70/90d, D1 70ost, D2 80ost, RI 80, LCX 80p->d, OM1 90, RCA 100p, RPDA fills from L->R collats-->Med Rx.  . ETOH abuse   . GERD (gastroesophageal reflux disease)    a. x 20 yrs  . Gout   . HFrEF (heart failure with reduced ejection fraction)  (HCC)    a. 02/2017 Echo: EF 20-25%, diff HK, mild Bradley, mildly dil RA, mild TR.  Marland Kitchen Hypertension    a. x 20 yrs - no meds since 2017.  . Ischemic cardiomyopathy    a. 02/2017 Echo: EF 20-25%, diff HK.  . NSTEMI (non-ST elevated myocardial infarction) (HCC)    a. 02/2017--sev 3VD-->turned down for CABG-->Med Rx.  . Obesity   . Tobacco abuse    Past Surgical History:  Procedure Laterality Date  . LEFT HEART CATH AND CORONARY ANGIOGRAPHY N/A 02/27/2017   Procedure: LEFT HEART CATH AND CORONARY ANGIOGRAPHY;  Surgeon: Yvonne Kendall, MD;  Location: [facility] INVASIVE CV LAB;  Service: Cardiovascular;  Laterality: N/A;  . LEFT HEART CATH AND CORONARY ANGIOGRAPHY N/A 04/09/2017   Procedure: LEFT HEART CATH AND CORONARY ANGIOGRAPHY;  Surgeon: Iran Ouch, MD;  Location: [facility] INVASIVE CV LAB;  Service: Cardiovascular;  Laterality: N/A;   Family History  Problem Relation Age of Onset  . Rheum arthritis Mother   . CAD Father        First MI in his 6's.  Died in his 58's  . Other Father        liver failure  . Diabetes Sister   . CAD Brother        s/p stenting   Social History   Tobacco Use  . Smoking status: Current Every Day Smoker    Packs/day: 1.00    Years: 30.00    Pack years: 30.00  Types: Cigarettes  . Smokeless tobacco: Never Used  Substance Use Topics  . Alcohol use: Yes    Comment: 1/5th of Vodka every weekend- states he quit 03/15/17   Allergies  Allergen Reactions  . Penicillins Rash    Has patient had a PCN reaction causing immediate rash, facial/tongue/throat swelling, SOB or lightheadedness with hypotension: Yes Has patient had a PCN reaction causing severe rash involving mucus membranes or skin necrosis: No Has patient had a PCN reaction that required hospitalization: No Has patient had a PCN reaction occurring within the last 10 years: No If all of the above answers are "NO", then may proceed with Cephalosporin use.   Prior to Admission medications    Medication Sig Start Date End Date Taking? Authorizing Provider  acetaminophen (TYLENOL) 500 MG tablet Take 1,000 mg by mouth every 4 (four) hours as needed.   Yes [provider]  allopurinol (ZYLOPRIM) 300 MG tablet Take 300 mg by mouth daily.   Yes [provider]  aspirin 81 MG EC tablet Take 1 tablet (81 mg total) by mouth daily. 05/10/17  Yes Clarisa KindredHackney, Seanne Chirico A, FNP  atorvastatin (LIPITOR) 80 MG tablet Take 1 tablet (80 mg total) by mouth daily at 6 PM. 05/10/17  Yes Clarisa KindredHackney, Yvonnia Tango A, FNP  carvedilol (COREG) 25 MG tablet Take 1 tablet (25 mg total) by mouth 2 (two) times daily. 07/25/17 11/16/17 Yes End, Cristal Deerhristopher, MD  clopidogrel (PLAVIX) 75 MG tablet Take 1 tablet (75 mg total) by mouth daily. 05/10/17  Yes Clarisa KindredHackney, Zyere Jiminez A, FNP  colchicine 0.6 MG tablet Take 0.6 mg by mouth as needed.    Yes [provider]  ezetimibe (ZETIA) 10 MG tablet Take 1 tablet (10 mg total) by mouth daily. 06/04/17 11/16/17 Yes Creig HinesBerge, Christopher Ronald, NP  furosemide (LASIX) 20 MG tablet TAKE ONE TABLET BY MOUTH EVERY DAY 10/01/17  Yes Creig HinesBerge, Christopher Ronald, NP  isosorbide mononitrate (IMDUR) 30 MG 24 hr tablet Take 1 tablet (30 mg total) by mouth daily. 05/10/17  Yes Gerianne Simonet A, FNP  nicotine (NICODERM CQ - DOSED IN MG/24 HOURS) 14 mg/24hr patch Place 1 patch (14 mg total) onto the skin daily. 07/25/17  Yes End, Cristal Deerhristopher, MD  nitroGLYCERIN (NITROSTAT) 0.4 MG SL tablet Place 1 tablet (0.4 mg total) under the tongue every 5 (five) minutes x 3 doses as needed for chest pain. 05/10/17  Yes Clarisa KindredHackney, Merrel Crabbe A, FNP  omeprazole (PRILOSEC OTC) 20 MG tablet Take 1 tablet (20 mg total) by mouth daily. 05/10/17  Yes Yoseline Andersson A, FNP  sacubitril-valsartan (ENTRESTO) 49-51 MG Take 1 tablet by mouth 2 (two) times daily. 08/01/17  Yes Clarisa KindredHackney, Pasqualina Colasurdo A, FNP  spironolactone (ALDACTONE) 25 MG tablet Take 1 tablet (25 mg total) by mouth daily. 05/10/17 05/10/18 Yes Delma FreezeHackney, Zykerria Tanton A, FNP    Review of  Systems  Constitutional: Positive for fatigue. Negative for appetite change.  HENT: Positive for congestion and tinnitus (at times). Negative for ear pain, postnasal drip and sore throat.   Eyes: Negative.   Respiratory: Positive for shortness of breath (with heavy exertion). Negative for cough and chest tightness.   Cardiovascular: Negative for chest pain, palpitations and leg swelling.  Gastrointestinal: Negative for abdominal distention and abdominal pain.  Endocrine: Negative.   Genitourinary: Negative.   Musculoskeletal: Positive for arthralgias (gout in right index finger). Negative for back pain.  Skin: Negative.   Allergic/Immunologic: Negative.   Neurological: Positive for dizziness and light-headedness.  Hematological: Negative for adenopathy. Does not bruise/bleed  easily.  Psychiatric/Behavioral: Positive for sleep disturbance (somedays sleep well and sometimes not). Negative for dysphoric mood. The patient is not nervous/anxious.    Vitals:   11/16/17 1315  BP: (!) 151/83  Pulse: 70  Resp: 18  SpO2: 99%  Weight: 254 lb 2 oz (115.3 kg)  Height: 6\' 1"  (1.854 m)   Wt Readings from Last 3 Encounters:  11/16/17 254 lb 2 oz (115.3 kg)  10/02/17 252 lb 12 oz (114.6 kg)  08/01/17 242 lb 8 oz (110 kg)   Lab Results  Component Value Date   CREATININE 1.20 07/25/2017   CREATININE 1.19 06/01/2017   CREATININE 1.20 04/25/2017    Physical Exam  Constitutional: He is oriented to person, place, and time. He appears well-developed and well-nourished.  HENT:  Head: Normocephalic and atraumatic.  Neck: Normal range of motion. Neck supple. No JVD present.  Cardiovascular: Normal rate and regular rhythm.  Pulmonary/Chest: Effort normal. He has no wheezes. He has no rales.  Abdominal: Soft. He exhibits no distension. There is no tenderness.  Musculoskeletal: He exhibits no edema or tenderness.  Neurological: He is alert and oriented to person, place, and time.  Skin: Skin is warm  and dry.  Psychiatric: He has a normal mood and affect. His behavior is normal. Thought content normal.  Nursing note and vitals reviewed.  Assessment & Plan:  1: Chronic heart failure with reduced ejection fraction- - NYHA class II - euvolemic today - not weighing daily. Encouraged to resume weighing daily so that he can call for an overnight weight gain of >2 pounds or a weekly weight gain of >5 pounds - weight up 12 pounds from last time he was here 3 months ago as he says that he's not exercising much - does add salt to eggs, tomatoes and potatoes. Reviewed the importance of not adding any salt to his food and closely following a 2000mg  sodium diet.   - drinking 2 cups coffee, 3-4 glasses of [facility] and 32 ounces of water daily - saw cardiology Brion Aliment) 10/02/17 - will not titrate up entresto today due to continued dizziness  2: HTN- - BP mildly elevated today - BP logs given to patient for him to continue tracking BP at home - sees PCP at [facility] - [facility]  Labs from [facility] dated September 03, 2017 Sodium 141, potassium 4.2, chloride 101, CO2 24, BUN 15, creatinine 1.20, calcium 9.6, total protein 7.4, albumin 4.5, total bilirubin 0.3, alkaline phosphatase 58, AST 15, ALT 15 Total cholesterol 136, triglycerides 189, HDL 31, LDL 67 TSH 2.680 Uric acid 8.6   3: CAD- - saw cardiothoracic surgeon Laneta Simmers) 05/30/17 and CABG was not recommended  4: Tobacco use- - currently smoking ~ 1ppd of cigarettes  - had stopped 3 times previously - complete cessation discussed for 3 minutes with him  5: Dizziness- - BP readings at home range from 122-171/ 62-104 - encouraged him to make an appointment to get his eyes checked as well as an appointment with PCP to check his ears - leary about adjusting entresto today due to continued dizziness  Patient did not bring his medications nor a list. Each medication was verbally reviewed with the patient and he was encouraged to  bring the bottles to every visit to confirm accuracy of list.  Return in 2 months or sooner for any questions/problems before then.

## 2017-12-11 ENCOUNTER — Telehealth: Payer: Self-pay | Admitting: Pharmacy Technician

## 2017-12-11 NOTE — Telephone Encounter (Signed)
Patient has full Medicaid that provides prescription coverage.  Patient no longer meets [facility]'s eligibility criteria.  Patient understands that no medication assistance will be provided by [facility].    Bradley DacostaBetty J. [name] Care Manager [facility]

## 2017-12-13 DIAGNOSIS — Z736 Limitation of activities due to disability: Secondary | ICD-10-CM

## 2018-01-03 ENCOUNTER — Telehealth: Payer: Self-pay | Admitting: Internal Medicine

## 2018-01-03 NOTE — Telephone Encounter (Signed)
Patient had stable NYHA class II-III heart failure symptoms at most recent follow-up visits with Ward Givenshris Berge, NP, and Clarisa Kindredina Hackney, NP.  As long as his symptoms are stable without worsening shortness of breath or chest pain, I think it is okay for him to proceed with his dental procedure.  Overall, this is a low risk procedure, though the patient's on revascularizable multivessel CAD certainly places him at higher risk for perioperative complications.  If possible, I would advocate for avoidance of general anesthesia.  If necessary, clopidogrel can be held 5 to 7 days prior to the procedure and restarted when felt safe to do so by his oral surgeon.  Aspirin should be maintained in the periprocedural period.  Bradley Kendallhristopher Megon Kalina, MD [facility] Pager: [phone]

## 2018-01-03 NOTE — Telephone Encounter (Signed)
   [facility] Pre-operative Risk Assessment    Request for surgical clearance:  1. What type of surgery is being performed?  Full mouth extractions  2. When is this surgery scheduled? Not listed  What type of clearance is required (medical clearance vs. Pharmacy clearance to hold med vs. Both)? Pharmacy  3. Are there any medications that need to be held prior to surgery and how long? Blood thinner, name not listed  4. Practice name and name of physician performing surgery? [place]. Alexis Trendowski  5. What is your office phone number [phone]   7.   What is your office fax number [phone]  8.   Anesthesia type (None, local, MAC, general) ? Not listed   Bradley Bridges 01/03/2018, 8:55 AM  _________________________________________________________________   (provider comments below)

## 2018-01-03 NOTE — Telephone Encounter (Signed)
No answer with patient. Left message to call back to reassess symptoms.

## 2018-01-03 NOTE — Telephone Encounter (Signed)
Next appointment is 01/16/18 with Dr End. Routing to Dr End.

## 2018-01-04 NOTE — Telephone Encounter (Signed)
S/w patient. He denies any worsening shortness of breath or chest pain. He is aware of his upcoming appointment with Dr End on 01/16/18. He does not think he will have the surgery before then.  Faxing this encounter to oral surgeon.

## 2018-01-07 ENCOUNTER — Telehealth: Payer: Self-pay | Admitting: Internal Medicine

## 2018-01-07 NOTE — Telephone Encounter (Signed)
Almeta MonasJohnson, Annette A  Clark, Jennifer O, RN        When I received your message, and I researched found that we did receive the Entresto 49/51-this has not been dispensed - found that patient has Medicaid effective 11/19/17. We will dispense this bottle to patient-he will receive a call when this is ready for pick up.   This will be the last order of this medication for this medication due to having Medicaid. [facility] and they will require a prior authorization for this med.   I have also included Denice Paradisehristan Holt our pharmacist on this, if you have any questions you can contact both of us.   Thanks,  Rhetta MuraAnnette Johnson  Prescription Assistance Coordinator  [facility]

## 2018-01-07 NOTE — Telephone Encounter (Signed)
Please call regarding Entresto prescription.

## 2018-01-07 NOTE — Telephone Encounter (Signed)
Patient calling States that [facility] did find his medication and he will be going to pick it up soon Wanted to make office aware

## 2018-01-07 NOTE — Telephone Encounter (Signed)
  Patient says he's due to get a shipment of Entresto. He usually gets it from [facility] but they dont have it right now. [facility] who said they had shipped it already. Advised patient to call [facility] back to see if they can help him determine what to do next.  I checked in our office and we did not have it. I will route this to [facility] at [facility] as well.

## 2018-01-10 ENCOUNTER — Ambulatory Visit: Payer: Medicaid Other | Admitting: Family

## 2018-01-11 ENCOUNTER — Telehealth: Payer: Self-pay | Admitting: Pharmacy Technician

## 2018-01-11 NOTE — Telephone Encounter (Signed)
Has Medicaid.  Patient acknowledged that he understood that [facility] would no longer be able to provide medication assistance.  Sherilyn DacostaBetty J. Zhaniya Swallows Care Manager [facility]

## 2018-01-16 ENCOUNTER — Encounter: Payer: Self-pay | Admitting: Internal Medicine

## 2018-01-16 ENCOUNTER — Ambulatory Visit (INDEPENDENT_AMBULATORY_CARE_PROVIDER_SITE_OTHER): Payer: Medicaid Other | Admitting: Internal Medicine

## 2018-01-16 VITALS — BP 144/80 | Ht 73.0 in | Wt 254.8 lb

## 2018-01-16 DIAGNOSIS — I255 Ischemic cardiomyopathy: Secondary | ICD-10-CM

## 2018-01-16 DIAGNOSIS — I10 Essential (primary) hypertension: Secondary | ICD-10-CM

## 2018-01-16 DIAGNOSIS — I5022 Chronic systolic (congestive) heart failure: Secondary | ICD-10-CM

## 2018-01-16 DIAGNOSIS — R42 Dizziness and giddiness: Secondary | ICD-10-CM | POA: Diagnosis not present

## 2018-01-16 DIAGNOSIS — I25118 Atherosclerotic heart disease of native coronary artery with other forms of angina pectoris: Secondary | ICD-10-CM | POA: Diagnosis not present

## 2018-01-16 DIAGNOSIS — E785 Hyperlipidemia, unspecified: Secondary | ICD-10-CM

## 2018-01-16 MED ORDER — SACUBITRIL-VALSARTAN 97-103 MG PO TABS: 1.0000 | ORAL_TABLET | Freq: Two times a day (BID) | ORAL | 3 refills | Status: DC

## 2018-01-16 NOTE — Patient Instructions (Signed)
Medication Instructions:  Your physician has recommended you make the following change in your medication:  1- INCREASE Entresto to 97-103 mg by mouth two times a day.   Labwork: Your physician recommends that you return for lab work in: TODAY - BMET.   Your physician recommends that you return for lab work in: 2 WEEKS FOR BMET. - AT THE MEDICAL MALL. - [facility]. You will check in at the front desk to the right as you walk into the atrium. Valet Parking is offered if needed.    Testing/Procedures: Your physician has requested that you have a carotid duplex. This test is an ultrasound of the carotid arteries in your neck. It looks at blood flow through these arteries that supply the brain with blood. Allow one hour for this exam. There are no restrictions or special instructions.    Follow-Up: Your physician recommends that you schedule a follow-up appointment in: 3 MONTHS WITH DR END.   If you need a refill on your cardiac medications before your next appointment, please call your pharmacy.

## 2018-01-16 NOTE — Progress Notes (Signed)
Follow-up Outpatient Visit Date: 01/16/2018  Primary Care Provider: [facility], [facility] [address] [place] [address]  Chief Complaint: Shortness of breath  HPI:  Bradley Bridges is a 52 y.o. year-old male with history of multivessel coronary artery disease not amenable to CABG or PCI, chronic systolic heart failure due to ischemic cardiomyopathy, hypertension, obesity, and tobacco/alcohol abuse, who presents for follow-up of coronary artery disease and heart failure.  He was last seen by Ward Givens, NP, in May, which time he reported chronic stable exertional dyspnea and intermittent orthostatic lightheadedness.  He reported being compliant with his medications but continued to smoke.  No medication changes were made at that time.  Today, Bradley Bridges feels well.  He continues to be somewhat limited in his activities due to exertional shortness of breath.  He describes a feeling "like the heart is expanding" when he does modest or strenuous activities.  This has been present ever since his first MI in 02/2017 but is gradually improving.  His biggest limiting factor has been recurrent gout flares.  He is now using allopurinol and as needed colchicine under the guidance of his PCP.  He denies orthopnea, PND, and edema, as well as palpitations.  He continues to have frequent lightheadedness, though it is not positional.  He has not passed out or fallen.  Bradley Bridges continues to smoke 1 PPD.  He is thinking of quitting but is not sure that he will be successful.  He has nicotine patches and lozenges at home.  --------------------------------------------------------------------------------------------------  Past Medical History:  Diagnosis Date  . Coronary artery disease    a. 02/2017 NSTEMI/Cath: LM 25, LAD 25ost, 70p, 57m, 70/90d, D1/2 mod dzs, RI 80, LCX 34m, OM1 small, OM2 mod dzs, RCA 100p, RPDA fills via L->R collats-->Not felt to be a good surgical candidate-->Med Rx;  b. 03/2017 NSTEMI/Cath: LM 25, LAD 25ost, 70p, 76m, 70/90d, D1 70ost, D2 80ost, RI 80, LCX 80p->d, OM1 90, RCA 100p, RPDA fills from L->R collats-->Med Rx.  . ETOH abuse   . GERD (gastroesophageal reflux disease)    a. x 20 yrs  . Gout   . HFrEF (heart failure with reduced ejection fraction) (HCC)    a. 02/2017 Echo: EF 20-25%, diff HK, mild MR, mildly dil RA, mild TR.  Marland Kitchen Hypertension    a. x 20 yrs - no meds since 2017.  . Ischemic cardiomyopathy    a. 02/2017 Echo: EF 20-25%, diff HK.  . NSTEMI (non-ST elevated myocardial infarction) (HCC)    a. 02/2017--sev 3VD-->turned down for CABG-->Med Rx.  . Obesity   . Tobacco abuse    Past Surgical History:  Procedure Laterality Date  . LEFT HEART CATH AND CORONARY ANGIOGRAPHY N/A 02/27/2017   Procedure: LEFT HEART CATH AND CORONARY ANGIOGRAPHY;  Surgeon: Yvonne Kendall, MD;  Location: [facility] INVASIVE CV LAB;  Service: Cardiovascular;  Laterality: N/A;  . LEFT HEART CATH AND CORONARY ANGIOGRAPHY N/A 04/09/2017   Procedure: LEFT HEART CATH AND CORONARY ANGIOGRAPHY;  Surgeon: Iran Ouch, MD;  Location: [facility] INVASIVE CV LAB;  Service: Cardiovascular;  Laterality: N/A;    Current Meds  Medication Sig  . acetaminophen (TYLENOL) 500 MG tablet Take 1,000 mg by mouth every 4 (four) hours as needed.  Marland Kitchen allopurinol (ZYLOPRIM) 300 MG tablet Take 300 mg by mouth daily.  Marland Kitchen aspirin 81 MG EC tablet Take 1 tablet (81 mg total) by mouth daily.  Marland Kitchen atorvastatin (LIPITOR) 80 MG tablet Take 1 tablet (80 mg  total) by mouth daily at 6 PM.  . carvedilol (COREG) 25 MG tablet Take 1 tablet (25 mg total) by mouth 2 (two) times daily.  . clopidogrel (PLAVIX) 75 MG tablet Take 1 tablet (75 mg total) by mouth daily.  . colchicine 0.6 MG tablet Take 0.6 mg by mouth as needed.   . ezetimibe (ZETIA) 10 MG tablet Take 1 tablet (10 mg total) by mouth daily.  . furosemide (LASIX) 20 MG tablet TAKE ONE TABLET BY MOUTH EVERY DAY  . isosorbide mononitrate (IMDUR) 30 MG  24 hr tablet Take 1 tablet (30 mg total) by mouth daily.  . nitroGLYCERIN (NITROSTAT) 0.4 MG SL tablet Place 1 tablet (0.4 mg total) under the tongue every 5 (five) minutes x 3 doses as needed for chest pain.  Marland Kitchen omeprazole (PRILOSEC OTC) 20 MG tablet Take 1 tablet (20 mg total) by mouth daily.  . sacubitril-valsartan (ENTRESTO) 49-51 MG Take 1 tablet by mouth 2 (two) times daily.  Marland Kitchen spironolactone (ALDACTONE) 25 MG tablet Take 1 tablet (25 mg total) by mouth daily.    Allergies: Penicillins  Social History   Tobacco Use  . Smoking status: Current Every Day Smoker    Packs/day: 1.00    Years: 30.00    Pack years: 30.00    Types: Cigarettes  . Smokeless tobacco: Never Used  Substance Use Topics  . Alcohol use: Yes    Comment: 1/5th of Vodka every weekend- states he quit 03/15/17  . 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 Rheum arthritis Mother   . CAD Father        First MI in his 93's.  Died in his 61's  . Other Father        liver failure  . Diabetes Sister   . CAD Brother        s/p stenting    Review of Systems: A 12-system review of systems was performed and was negative except as noted in the HPI.  --------------------------------------------------------------------------------------------------  Physical Exam: BP (!) 144/80 (BP Location: Left Arm, Patient Position: Sitting, Cuff Size: Large)   Ht 6\' 1"  (1.854 m)   Wt 254 lb 12 oz (115.6 kg)   BMI 33.61 kg/m   General:  NAD.  Accompanied by wife. HEENT: No conjunctival pallor or scleral icterus. Moist mucous membranes.  OP clear. Neck: Supple without lymphadenopathy, thyromegaly, JVD, or HJR. No carotid bruit. Lungs: Normal work of breathing. Clear to auscultation bilaterally without wheezes or crackles. Heart: Regular rate and rhythm without murmurs, rubs, or gallops. Non-displaced PMI. Abd: Bowel sounds present.  Soft, NT/ND without hepatosplenomegaly Ext: No lower extremity edema. Radial, PT, and DP pulses are 2+ bilaterally. Skin: Warm and dry without rash.  EKG:  Normal sinus rhythm, LAFB, and inferior Q-waves.  LAFB is new since 10/02/17, though it has been noted in the more remote past.  Lab Results  Component Value Date   WBC 10.0 04/10/2017   HGB 12.9 (L) 04/10/2017   HCT 37.6 (L) 04/10/2017   MCV 87.3 04/10/2017   PLT 376 04/10/2017    Lab Results  Component Value Date   NA 136 07/25/2017   K 4.7 07/25/2017   CL 100 07/25/2017   CO2 21 07/25/2017   BUN 14 07/25/2017   CREATININE 1.20 07/25/2017   GLUCOSE 123 (H) 07/25/2017   ALT 21 06/01/2017  Lab Results  Component Value Date   CHOL 196 02/26/2017   HDL 44 02/26/2017   LDLCALC 108 (H) 02/26/2017   TRIG 218 (H) 02/26/2017   CHOLHDL 4.5 02/26/2017    --------------------------------------------------------------------------------------------------  ASSESSMENT AND PLAN: Coronary artery disease with stable angina Patient has severe diffuse multivessel coronary artery disease that is not amenable to CABG or PCI (he has been turned down for CABG twice).  We will continue his current medications for secondary prevention and antianginal therapy.  I have encouraged him to increase his activity gradually in the effort to have a regular regimen of moderate-intensity exercise.  Chronic systolic heart failure due to ischemic cardiomyopathy Bradley Bridges appears euvolemic on exam with NYHA class II-III symptoms, gradually improving over the last 6-9 months.  We will increase Entresto to 97-103 mg BID (target dose), with BMP today and in ~2 weeks.  He should let us know if his lightheadedness worsens (though his BP is mildly elevated again today).  We will need to repeat an echo once Bradley Bridges' HF regimen has been optimized.  This will be readdressed when he returns for follow-up.  Lightheadedness I am not convinced that his is due to  hypotension and does not seem to be orthostatic by the patient's description.  I will order carotid Doppler study to exclude cerebrovascular disease.  Hypertension Increase Entresto, as above.  Continue current doses of carvedilol and spironolactone.  Hyperlipidemia Continue atorvastatin 80 mg daily.  Tobacco abuse Discussed at length with Bradley Bridges.  I recommended that he contact the quit line to discuss strategies, as well as take advantage of nicotine patches/lozenges that he has already procured.  If he remains unsuccessful, we could consider Chantix in the future.  Follow-up: Return to clinic in 3 months.  Yvonne Kendallhristopher Marquisha Nikolov, MD 01/16/2018 11:47 AM

## 2018-01-17 ENCOUNTER — Encounter: Payer: Self-pay | Admitting: Internal Medicine

## 2018-01-17 DIAGNOSIS — I25118 Atherosclerotic heart disease of native coronary artery with other forms of angina pectoris: Secondary | ICD-10-CM | POA: Insufficient documentation

## 2018-01-17 LAB — BASIC METABOLIC PANEL
BUN/Creatinine Ratio: 10 (ref 9–20)
BUN: 11 mg/dL (ref 6–24)
CHLORIDE: 102 mmol/L (ref 96–106)
CO2: 23 mmol/L (ref 20–29)
CREATININE: 1.1 mg/dL (ref 0.76–1.27)
Calcium: 9.4 mg/dL (ref 8.7–10.2)
GFR calc non Af Amer: 77 mL/min/{1.73_m2} (ref >59)
GFR, EST AFRICAN AMERICAN: 89 mL/min/{1.73_m2} (ref >59)
GLUCOSE: 137 mg/dL — AB (ref 65–99)
POTASSIUM: 4.2 mmol/L (ref 3.5–5.2)
Sodium: 140 mmol/L (ref 134–144)

## 2018-01-18 ENCOUNTER — Other Ambulatory Visit: Payer: Self-pay | Admitting: *Deleted

## 2018-01-18 DIAGNOSIS — I5022 Chronic systolic (congestive) heart failure: Secondary | ICD-10-CM

## 2018-01-18 DIAGNOSIS — I255 Ischemic cardiomyopathy: Secondary | ICD-10-CM

## 2018-01-24 ENCOUNTER — Telehealth: Payer: Self-pay | Admitting: Internal Medicine

## 2018-01-24 NOTE — Telephone Encounter (Signed)
Fax received from [facility] pharmacy stating that the patient will need a Prior Authorization for Entresto 97/103 mg BID. This dose was increased on 01/16/18 by Dr. Okey Dupre.  Per fax, the patient has Medicaid of [place] Member ID #- [id number] S Ins. Contact #- [phone] Called the [phone] # and received a fast busy signal.  Went on CoverMyMeds and obtained the [employer]- in form # [phone]. Attempted to call this #- fast busty also received at this #.  Will attempt to call both #'s again tomorrow.

## 2018-01-25 ENCOUNTER — Ambulatory Visit (INDEPENDENT_AMBULATORY_CARE_PROVIDER_SITE_OTHER): Payer: Medicaid Other

## 2018-01-25 DIAGNOSIS — R42 Dizziness and giddiness: Secondary | ICD-10-CM

## 2018-01-25 NOTE — Telephone Encounter (Signed)
S/w [employer] representative.  Submitted PA over the phone.  PA # [id number]. Ref ID [id number]. Awaiting determination.

## 2018-01-29 NOTE — Telephone Encounter (Signed)
S/w [employer] representative regarding PA. Patient has been approved from 01/25/18 to 01/20/2019.  S/w [facility] pharmacy and had them run the [place]. They received the approval and medication is available for $3.  They will send a notification to the patient.

## 2018-02-01 ENCOUNTER — Other Ambulatory Visit
Admission: RE | Admit: 2018-02-01 | Discharge: 2018-02-01 | Disposition: A | Discharge status: Home / Self Care | Payer: Medicaid Other | Source: Ambulatory Visit | Attending: Internal Medicine | Admitting: Internal Medicine

## 2018-02-01 ENCOUNTER — Other Ambulatory Visit: Payer: Self-pay

## 2018-02-01 ENCOUNTER — Telehealth: Payer: Self-pay | Admitting: Internal Medicine

## 2018-02-01 ENCOUNTER — Other Ambulatory Visit
Admission: RE | Admit: 2018-02-01 | Discharge: 2018-02-01 | Disposition: A | Discharge status: Home / Self Care | Payer: Medicaid Other | Source: Ambulatory Visit | Attending: Ophthalmology | Admitting: Ophthalmology

## 2018-02-01 DIAGNOSIS — I10 Essential (primary) hypertension: Secondary | ICD-10-CM | POA: Diagnosis not present

## 2018-02-01 DIAGNOSIS — I5022 Chronic systolic (congestive) heart failure: Secondary | ICD-10-CM | POA: Insufficient documentation

## 2018-02-01 LAB — BASIC METABOLIC PANEL
Anion gap: 8 (ref 5–15)
BUN: 15 mg/dL (ref 6–20)
CHLORIDE: 104 mmol/L (ref 98–111)
CO2: 26 mmol/L (ref 22–32)
CREATININE: 1.19 mg/dL (ref 0.61–1.24)
Calcium: 9.1 mg/dL (ref 8.9–10.3)
GFR calc Af Amer: >60 mL/min (ref >60)
Glucose, Bld: 119 mg/dL — ABNORMAL HIGH (ref 70–99)
Potassium: 4 mmol/L (ref 3.5–5.1)
SODIUM: 138 mmol/L (ref 135–145)

## 2018-02-01 LAB — GLUCOSE, RANDOM: GLUCOSE: 118 mg/dL — AB (ref 70–99)

## 2018-02-01 MED ORDER — FUROSEMIDE 20 MG PO TABS: 20.0000 mg | ORAL_TABLET | Freq: Every day | ORAL | 1 refills | Status: AC

## 2018-02-01 NOTE — Telephone Encounter (Signed)
Patient came by office  States he is heading to [facility] now to pick up another prescription, would like to get both at same time if possible Please send ASAP  *STAT* If patient is at the pharmacy, call can be transferred to refill team.   1. Which medications need to be refilled? (please list name of each medication and dose if known)  Furosemide (LASIX) 20 MG - 1 tablet daily  2. Which pharmacy/location (including street and city if local pharmacy) is medication to be sent to? [facility] on [address]  3. Do they need a 30 day or 90 day supply? 30 day

## 2018-02-07 ENCOUNTER — Ambulatory Visit (INDEPENDENT_AMBULATORY_CARE_PROVIDER_SITE_OTHER): Payer: Medicaid Other

## 2018-02-07 ENCOUNTER — Encounter: Payer: Self-pay | Admitting: Family

## 2018-02-07 ENCOUNTER — Ambulatory Visit: Payer: Medicaid Other | Attending: Family | Admitting: Family

## 2018-02-07 ENCOUNTER — Other Ambulatory Visit: Payer: Self-pay

## 2018-02-07 VITALS — BP 144/79 | HR 79 | Resp 18 | Ht 73.0 in | Wt 254.5 lb

## 2018-02-07 DIAGNOSIS — I255 Ischemic cardiomyopathy: Secondary | ICD-10-CM

## 2018-02-07 DIAGNOSIS — F17210 Nicotine dependence, cigarettes, uncomplicated: Secondary | ICD-10-CM | POA: Diagnosis not present

## 2018-02-07 DIAGNOSIS — I252 Old myocardial infarction: Secondary | ICD-10-CM | POA: Diagnosis not present

## 2018-02-07 DIAGNOSIS — Z880 Allergy status to penicillin: Secondary | ICD-10-CM | POA: Diagnosis not present

## 2018-02-07 DIAGNOSIS — I110 Hypertensive heart disease with heart failure: Secondary | ICD-10-CM | POA: Diagnosis present

## 2018-02-07 DIAGNOSIS — Z8249 Family history of ischemic heart disease and other diseases of the circulatory system: Secondary | ICD-10-CM | POA: Diagnosis not present

## 2018-02-07 DIAGNOSIS — Z833 Family history of diabetes mellitus: Secondary | ICD-10-CM | POA: Diagnosis not present

## 2018-02-07 DIAGNOSIS — K219 Gastro-esophageal reflux disease without esophagitis: Secondary | ICD-10-CM | POA: Diagnosis not present

## 2018-02-07 DIAGNOSIS — Z7982 Long term (current) use of aspirin: Secondary | ICD-10-CM | POA: Insufficient documentation

## 2018-02-07 DIAGNOSIS — Z79899 Other long term (current) drug therapy: Secondary | ICD-10-CM | POA: Insufficient documentation

## 2018-02-07 DIAGNOSIS — E669 Obesity, unspecified: Secondary | ICD-10-CM | POA: Insufficient documentation

## 2018-02-07 DIAGNOSIS — I2510 Atherosclerotic heart disease of native coronary artery without angina pectoris: Secondary | ICD-10-CM | POA: Insufficient documentation

## 2018-02-07 DIAGNOSIS — I5022 Chronic systolic (congestive) heart failure: Secondary | ICD-10-CM | POA: Diagnosis not present

## 2018-02-07 DIAGNOSIS — Z6833 Body mass index (BMI) 33.0-33.9, adult: Secondary | ICD-10-CM | POA: Diagnosis not present

## 2018-02-07 DIAGNOSIS — I10 Essential (primary) hypertension: Secondary | ICD-10-CM

## 2018-02-07 DIAGNOSIS — Z720 Tobacco use: Secondary | ICD-10-CM

## 2018-02-07 LAB — ECHOCARDIOGRAM COMPLETE
HEIGHTINCHES: 73 in
Weight: 4072 oz

## 2018-02-07 NOTE — Patient Instructions (Signed)
Resume weighing daily and call for an overnight weight gain of > 2 pounds or a weekly weight gain of >5 pounds. 

## 2018-02-07 NOTE — Progress Notes (Signed)
Patient ID: Bradley Bridges, male    DOB: June 25, 1965, [age] y.o.   MRN: [id number]  HPI  Bradley Bridges is a 52 y/o male with a history of CAD, alcohol use, GERD, HTN, NSTEMI, current tobacco use and chronic heart failure.   Echo report from 02/28/17 reviewed and showed an EF of 20-25% along with mild Bradley/TR. EF in 2009 was 55%. Cardiac catheterization done 04/09/17 showed severe three vessel CAD with no significant change in coronary anatomy since previous catheterization. Well-developed collaterals from the LAD to the right RCA. Mildly elevated left ventricular end-diastolic pressure. Referral considered back to cardiothoracic surgery for CABG.   Has not been admitted or been in the ED in the last 6 months.   Patient presents today for a follow-up visit with a chief complaint of minimal shortness of breath upon moderate exertion. He describes this as chronic in nature having been present for several years. He has associated fatigue, head congestion, dizziness and gout pain along with this. He denies difficulty sleeping, abdominal distention, palpitations, pedal edema, chest pain, cough or weight gain. Having echocardiogram done today.  Past Medical History:  Diagnosis Date  . Coronary artery disease    a. 02/2017 NSTEMI/Cath: LM 25, LAD 25ost, 70p, 3012m, 70/90d, D1/2 mod dzs, RI 80, LCX 5443m, OM1 small, OM2 mod dzs, RCA 100p, RPDA fills via L->R collats-->Not felt to be a good surgical candidate-->Med Rx; b. 03/2017 NSTEMI/Cath: LM 25, LAD 25ost, 70p, 3412m, 70/90d, D1 70ost, D2 80ost, RI 80, LCX 80p->d, OM1 90, RCA 100p, RPDA fills from L->R collats-->Med Rx.  . ETOH abuse   . GERD (gastroesophageal reflux disease)    a. x 20 yrs  . Gout   . HFrEF (heart failure with reduced ejection fraction) (HCC)    a. 02/2017 Echo: EF 20-25%, diff HK, mild Bradley, mildly dil RA, mild TR.  Marland Kitchen. Hypertension    a. x 20 yrs - no meds since 2017.  . Ischemic cardiomyopathy    a. 02/2017 Echo: EF 20-25%, diff HK.  .  NSTEMI (non-ST elevated myocardial infarction) (HCC)    a. 02/2017--sev 3VD-->turned down for CABG-->Med Rx.  . Obesity   . Tobacco abuse    Past Surgical History:  Procedure Laterality Date  . LEFT HEART CATH AND CORONARY ANGIOGRAPHY N/A 02/27/2017   Procedure: LEFT HEART CATH AND CORONARY ANGIOGRAPHY;  Surgeon: Yvonne KendallEnd, Christopher, MD;  Location: [facility] INVASIVE CV LAB;  Service: Cardiovascular;  Laterality: N/A;  . LEFT HEART CATH AND CORONARY ANGIOGRAPHY N/A 04/09/2017   Procedure: LEFT HEART CATH AND CORONARY ANGIOGRAPHY;  Surgeon: Iran OuchArida, Muhammad A, MD;  Location: [facility] INVASIVE CV LAB;  Service: Cardiovascular;  Laterality: N/A;   Family History  Problem Relation Age of Onset  . Rheum arthritis Mother   . CAD Father        First MI in his 6640's.  Died in his 1250's  . Other Father        liver failure  . Diabetes Sister   . CAD Brother        s/p stenting   Social History   Tobacco Use  . Smoking status: Current Every Day Smoker    Packs/day: 1.00    Years: 30.00    Pack years: 30.00    Types: Cigarettes  . Smokeless tobacco: Never Used  Substance Use Topics  . Alcohol use: Yes    Comment: 1/5th of Vodka every weekend- states he quit 03/15/17   Allergies  Allergen Reactions  .  Penicillins Rash    Has patient had a PCN reaction causing immediate rash, facial/tongue/throat swelling, SOB or lightheadedness with hypotension: Yes Has patient had a PCN reaction causing severe rash involving mucus membranes or skin necrosis: No Has patient had a PCN reaction that required hospitalization: No Has patient had a PCN reaction occurring within the last 10 years: No If all of the above answers are "NO", then may proceed with Cephalosporin use.   Prior to Admission medications   Medication Sig Start Date End Date Taking? Authorizing Provider  acetaminophen (TYLENOL) 500 MG tablet Take 1,000 mg by mouth every 4 (four) hours as needed.   Yes [provider]  allopurinol  (ZYLOPRIM) 300 MG tablet Take 300 mg by mouth daily.   Yes [provider]  aspirin 81 MG EC tablet Take 1 tablet (81 mg total) by mouth daily. 05/10/17  Yes Clarisa Kindred A, FNP  atorvastatin (LIPITOR) 80 MG tablet Take 1 tablet (80 mg total) by mouth daily at 6 PM. 05/10/17  Yes Clarisa Kindred A, FNP  carvedilol (COREG) 25 MG tablet Take 1 tablet (25 mg total) by mouth 2 (two) times daily. 07/25/17 02/07/18 Yes End, Cristal Deer, MD  clopidogrel (PLAVIX) 75 MG tablet Take 1 tablet (75 mg total) by mouth daily. 05/10/17  Yes Clarisa Kindred A, FNP  colchicine 0.6 MG tablet Take 0.6 mg by mouth as needed.    Yes [provider]  furosemide (LASIX) 20 MG tablet Take 1 tablet (20 mg total) by mouth daily. 02/01/18  Yes End, Cristal Deer, MD  isosorbide mononitrate (IMDUR) 30 MG 24 hr tablet Take 1 tablet (30 mg total) by mouth daily. 05/10/17  Yes Yiannis Tulloch, Inetta Fermo A, FNP  nitroGLYCERIN (NITROSTAT) 0.4 MG SL tablet Place 1 tablet (0.4 mg total) under the tongue every 5 (five) minutes x 3 doses as needed for chest pain. 05/10/17  Yes Clarisa Kindred A, FNP  omeprazole (PRILOSEC OTC) 20 MG tablet Take 1 tablet (20 mg total) by mouth daily. 05/10/17  Yes Monroe Toure A, FNP  sacubitril-valsartan (ENTRESTO) 97-103 MG Take 1 tablet by mouth 2 (two) times daily. 01/16/18  Yes End, Cristal Deer, MD  spironolactone (ALDACTONE) 25 MG tablet Take 1 tablet (25 mg total) by mouth daily. 05/10/17 05/10/18 Yes Clarisa Kindred A, FNP  ezetimibe (ZETIA) 10 MG tablet Take 1 tablet (10 mg total) by mouth daily. 06/04/17 01/16/18  Creig Hines, NP    Review of Systems  Constitutional: Positive for fatigue. Negative for appetite change.  HENT: Positive for congestion and tinnitus (at times). Negative for ear pain, postnasal drip and sore throat.   Eyes: Negative.   Respiratory: Positive for shortness of breath (with heavy exertion). Negative for cough and chest tightness.   Cardiovascular: Negative for  chest pain, palpitations and leg swelling.  Gastrointestinal: Negative for abdominal distention and abdominal pain.  Endocrine: Negative.   Genitourinary: Negative.   Musculoskeletal: Positive for arthralgias (gout in right index finger). Negative for back pain.  Skin: Negative.   Allergic/Immunologic: Negative.   Neurological: Positive for dizziness and light-headedness.  Hematological: Negative for adenopathy. Does not bruise/bleed easily.  Psychiatric/Behavioral: Negative for dysphoric mood and sleep disturbance. The patient is not nervous/anxious.    Vitals:   02/07/18 1138  BP: (!) 144/79  Pulse: 79  Resp: 18  SpO2: 98%  Weight: 254 lb 8 oz (115.4 kg)  Height: 6\' 1"  (1.854 m)   Wt Readings from Last 3 Encounters:  02/07/18 254 lb 8 oz (115.4 kg)  01/16/18 254 lb 12 oz (115.6 kg)  11/16/17 254 lb 2 oz (115.3 kg)   Lab Results  Component Value Date   CREATININE 1.19 02/01/2018   CREATININE 1.10 01/16/2018   CREATININE 1.20 07/25/2017   Physical Exam  Constitutional: He is oriented to person, place, and time. He appears well-developed and well-nourished.  HENT:  Head: Normocephalic and atraumatic.  Neck: Normal range of motion. Neck supple. No JVD present.  Cardiovascular: Normal rate and regular rhythm.  Pulmonary/Chest: Effort normal. He has no wheezes. He has no rales.  Abdominal: Soft. He exhibits no distension. There is no tenderness.  Musculoskeletal: He exhibits no edema or tenderness.  Neurological: He is alert and oriented to person, place, and time.  Skin: Skin is warm and dry.  Psychiatric: He has a normal mood and affect. His behavior is normal. Thought content normal.  Nursing note and vitals reviewed.  Assessment & Plan:  1: Chronic heart failure with reduced ejection fraction- - NYHA class II - euvolemic today - not weighing daily. Encouraged to resume weighing daily so that he can call for an overnight weight gain of >2 pounds or a weekly weight  gain of >5 pounds - weight unchanged from last time he was here 3 months ago - does add salt to eggs, tomatoes and potatoes. Reviewed the importance of not adding any salt to his food and closely following a 2000mg  sodium diet.   - drinking 2 cups coffee, 3-4 glasses of [facility] and 32 ounces of water daily - saw cardiology (End) 01/16/18 & entresto was maximized - he does not take the flu vaccine; encouraged good handwashing  2: HTN- - BP looks good today - sees PCP at [facility] - BMP from 02/01/18 reviewed and showed sodium 138, potassium 4.0, creatinine 1.19 and GFR >60   3: CAD- - saw cardiothoracic surgeon Laneta Simmers) 05/30/17 and CABG was not recommended  4: Tobacco use- - currently smoking ~ 1ppd of cigarettes  - had stopped 3 times previously but currently doesn't have a desire to quit at this time - complete cessation discussed for 3 minutes with him  Patient did not bring his medications nor a list. Each medication was verbally reviewed with the patient and he was encouraged to bring the bottles to every visit to confirm accuracy of list.  Return in 6 months or sooner for any questions/problems before then.

## 2018-02-08 ENCOUNTER — Telehealth: Payer: Self-pay | Admitting: *Deleted

## 2018-02-08 NOTE — Telephone Encounter (Signed)
-----   Message from Yvonne Kendallhristopher End, MD sent at 02/08/2018  2:14 PM EDT ----- Please let the patient know that his echocardiogram shows some improvement in his left ventricular systolic function. It remains moderately reduced. He should continue his current medications and follow up as previously discussed.

## 2018-02-08 NOTE — Telephone Encounter (Signed)
No answer. Left message to call back.   

## 2018-02-20 NOTE — Telephone Encounter (Signed)
No answer. Left message to call back.   

## 2018-02-21 ENCOUNTER — Encounter: Payer: Self-pay | Admitting: *Deleted

## 2018-02-21 NOTE — Telephone Encounter (Signed)
Patient calling to discuss recent echo testing results  ° °Please call  ° °

## 2018-02-21 NOTE — Telephone Encounter (Signed)
No answer. Left message that I have mailed the results and to call back if he has any further questions.

## 2018-02-21 NOTE — Telephone Encounter (Signed)
No answer. Left message to call back.  Third attempt with no answer.  Letter with results mailed to patient.

## 2018-04-10 ENCOUNTER — Ambulatory Visit: Payer: Medicaid Other | Admitting: Internal Medicine

## 2018-04-25 ENCOUNTER — Encounter: Payer: Self-pay | Admitting: Nurse Practitioner

## 2018-04-25 ENCOUNTER — Ambulatory Visit (INDEPENDENT_AMBULATORY_CARE_PROVIDER_SITE_OTHER): Payer: Medicaid Other | Admitting: Nurse Practitioner

## 2018-04-25 VITALS — BP 142/86 | HR 84 | Ht 73.0 in | Wt 261.0 lb

## 2018-04-25 DIAGNOSIS — I255 Ischemic cardiomyopathy: Secondary | ICD-10-CM | POA: Diagnosis not present

## 2018-04-25 DIAGNOSIS — I25118 Atherosclerotic heart disease of native coronary artery with other forms of angina pectoris: Secondary | ICD-10-CM | POA: Diagnosis not present

## 2018-04-25 DIAGNOSIS — E785 Hyperlipidemia, unspecified: Secondary | ICD-10-CM

## 2018-04-25 DIAGNOSIS — I10 Essential (primary) hypertension: Secondary | ICD-10-CM | POA: Diagnosis not present

## 2018-04-25 DIAGNOSIS — I5042 Chronic combined systolic (congestive) and diastolic (congestive) heart failure: Secondary | ICD-10-CM | POA: Diagnosis not present

## 2018-04-25 MED ORDER — ISOSORBIDE MONONITRATE ER 60 MG PO TB24: 60.0000 mg | ORAL_TABLET | Freq: Every day | ORAL | 3 refills | Status: DC

## 2018-04-25 MED ORDER — EZETIMIBE 10 MG PO TABS: 10.0000 mg | ORAL_TABLET | Freq: Every day | ORAL | 3 refills | Status: AC

## 2018-04-25 NOTE — Patient Instructions (Addendum)
Medication Instructions:  - Your physician has recommended you make the following change in your medication:   1) INCREASE imdur (isosorbide MN) to 60 mg- take 1 tablet by mouth once daily  If you need a refill on your cardiac medications before your next appointment, please call your pharmacy.   Lab work: - Your physician recommends that you have lab work today: BMP  If you have labs (blood work) drawn today and your tests are completely normal, you will receive your results only by: Marland Kitchen. MyChart Message (if you have MyChart) OR . A paper copy in the mail If you have any lab test that is abnormal or we need to change your treatment, we will call you to review the results.  Testing/Procedures: - none ordered  Follow-Up: At [facility], you and your health needs are our priority.  As part of our continuing mission to provide you with exceptional heart care, we have created designated Provider Care Teams.  These Care Teams include your primary Cardiologist (physician) and Advanced Practice Providers (APPs -  Physician Assistants and Nurse Practitioners) who all work together to provide you with the care you need, when you need it. . You will need a follow up appointment in 3 months with Dr. Okey DupreEnd  Any Other Special Instructions Will Be Listed Below (If Applicable). - N/A

## 2018-04-25 NOTE — Progress Notes (Signed)
Office Visit    Patient Name: Bradley Bridges Date of Encounter: 04/25/2018  Primary Care Provider:  Martie Round, NP Primary Cardiologist:  Yvonne Kendall, MD  Chief Complaint    52 y/o ? with a h/o CAD, ischemic cardiomyopathy, HFrEF, ongoing tobacco abuse, hypertension, hyperlipidemia, obesity, and remote alcohol abuse, who presents for follow-up related to heart failure.  Past Medical History    Past Medical History:  Diagnosis Date  . Chronic combined systolic (congestive) and diastolic (congestive) heart failure (HCC)    a. 02/2017 Echo: EF 20-25%, diff HK, mild MR, mildly dil RA, mild TR; b. 01/2018 Echo: EF 40-45%, diff HK, Gr1 DD, mild MR, mildly dil LA. Nl RV fxn.  . Coronary artery disease    a. 02/2017 NSTEMI/Cath: LM 25, LAD 25ost, 70p, 27m, 70/90d, D1/2 mod dzs, RI 80, LCX 57m, OM1 small, OM2 mod dzs, RCA 100p, RPDA fills via L->R collats-->Not felt to be a good surgical candidate-->Med Rx; b. 03/2017 NSTEMI/Cath: LM 25, LAD 25ost, 70p, 46m, 70/90d, D1 70ost, D2 80ost, RI 80, LCX 80p->d, OM1 90, RCA 100p, RPDA fills from L->R collats-->Med Rx.  . ETOH abuse   . GERD (gastroesophageal reflux disease)    a. x 20 yrs  . Gout   . Hypertension    a. x 20 yrs - no meds since 2017.  . Ischemic cardiomyopathy    a. 02/2017 Echo: EF 20-25%, diff HK; b. 01/2018 Echo: EF 40-45%, diff HK.  . NSTEMI (non-ST elevated myocardial infarction) (HCC)    a. 02/2017--sev 3VD-->turned down for CABG-->Med Rx.  . Obesity   . Tobacco abuse    Past Surgical History:  Procedure Laterality Date  . LEFT HEART CATH AND CORONARY ANGIOGRAPHY N/A 02/27/2017   Procedure: LEFT HEART CATH AND CORONARY ANGIOGRAPHY;  Surgeon: Yvonne Kendall, MD;  Location: [facility] INVASIVE CV LAB;  Service: Cardiovascular;  Laterality: N/A;  . LEFT HEART CATH AND CORONARY ANGIOGRAPHY N/A 04/09/2017   Procedure: LEFT HEART CATH AND CORONARY ANGIOGRAPHY;  Surgeon: Iran Ouch, MD;  Location: [facility] INVASIVE  CV LAB;  Service: Cardiovascular;  Laterality: N/A;  . TOOTH EXTRACTION      Allergies  Allergies  Allergen Reactions  . Penicillins Rash    Has patient had a PCN reaction causing immediate rash, facial/tongue/throat swelling, SOB or lightheadedness with hypotension: Yes Has patient had a PCN reaction causing severe rash involving mucus membranes or skin necrosis: No Has patient had a PCN reaction that required hospitalization: No Has patient had a PCN reaction occurring within the last 10 years: No If all of the above answers are "NO", then may proceed with Cephalosporin use.    History of Present Illness    51 year old male with the above complex past medical history including hypertension, obesity, tobacco abuse, and alcohol abuse.  In October 2018, he was admitted with late presenting STEMI.  Catheterization revealed significant multivessel disease and he was transferred to [facility] for CT surgical evaluation, but was felt to be a poor candidate secondary to tobacco abuse and LV dysfunction with EF of 2025%.  He had recurrent non-STEMI November 2018 with a peak troponin of 10.26 and catheterization revealed stable, severe coronary disease.  He was medically managed and saw thoracic surgery again in January 2019 and due to ongoing comorbidities and poor targets for grafting, he was again not felt to be a candidate for bypass.  He was last seen in cardiology clinic in August, at which time he was having stable  dyspnea on exertion, though his biggest limiting factor was recurrent gout.  His Entresto therapy was titrated.  He was also having some lightheadedness and underwent carotid Dopplers which were relatively normal.  An echocardiogram was performed in late September, showing improvement in LV function to 40 to 45% with diffuse hypokinesis and grade 1 diastolic dysfunction.  Patient was subsequently seen in heart failure clinic in late September, at which time he was stable.  Over the past 2-1/2  months, he reports stable, chronic dyspnea on exertion.  His activity is very limited as he just prefers to be sedentary.  He sometimes notes exertional dyspnea and angina when carrying his laundry up his stairs.  He says he only does the wash about once a month though so it is not that frequent.  He has had exertional dyspnea with other activities in and around the house and overall the frequency of chest discomfort is stable.  He continues to smoke a pack a day.  He is careful with his salt intake.  He is not weighing himself every day.  He reports that he only takes spironolactone and Lasix on occasion as he feels as though it causes him to urinate too frequently.  He denies palpitations, PND, orthopnea, dizziness, syncope, edema, or early satiety.  Home Medications    Prior to Admission medications   Medication Sig Start Date End Date Taking? Authorizing Provider  acetaminophen (TYLENOL) 500 MG tablet Take 1,000 mg by mouth every 4 (four) hours as needed.   Yes [provider]  allopurinol (ZYLOPRIM) 300 MG tablet Take 300 mg by mouth daily.   Yes [provider]  aspirin 81 MG EC tablet Take 1 tablet (81 mg total) by mouth daily. 05/10/17  Yes Clarisa KindredHackney, Tina A, FNP  atorvastatin (LIPITOR) 80 MG tablet Take 1 tablet (80 mg total) by mouth daily at 6 PM. 05/10/17  Yes Clarisa KindredHackney, Tina A, FNP  carvedilol (COREG) 25 MG tablet Take 1 tablet (25 mg total) by mouth 2 (two) times daily. 07/25/17 04/25/18 Yes End, Cristal Deerhristopher, MD  clopidogrel (PLAVIX) 75 MG tablet Take 1 tablet (75 mg total) by mouth daily. 05/10/17  Yes Clarisa KindredHackney, Tina A, FNP  colchicine 0.6 MG tablet Take 0.6 mg by mouth as needed.    Yes [provider]  ezetimibe (ZETIA) 10 MG tablet Take 1 tablet (10 mg total) by mouth daily. 06/04/17 04/25/18 Yes Creig HinesBerge, Jazzlene Huot Ronald, NP  furosemide (LASIX) 20 MG tablet Take 1 tablet (20 mg total) by mouth daily. 02/01/18  Yes End, Cristal Deerhristopher, MD  isosorbide mononitrate (IMDUR)  30 MG 24 hr tablet Take 1 tablet (30 mg total) by mouth daily. 05/10/17  Yes Hackney, [name] A, FNP  nitroGLYCERIN (NITROSTAT) 0.4 MG SL tablet Place 1 tablet (0.4 mg total) under the tongue every 5 (five) minutes x 3 doses as needed for chest pain. 05/10/17  Yes Clarisa KindredHackney, Tina A, FNP  omeprazole (PRILOSEC OTC) 20 MG tablet Take 1 tablet (20 mg total) by mouth daily. 05/10/17  Yes Hackney, Tina A, FNP  sacubitril-valsartan (ENTRESTO) 97-103 MG Take 1 tablet by mouth 2 (two) times daily. 01/16/18  Yes End, Cristal Deerhristopher, MD  spironolactone (ALDACTONE) 25 MG tablet Take 1 tablet (25 mg total) by mouth daily. 05/10/17 05/10/18 Yes Delma FreezeHackney, Tina A, FNP    Review of Systems    Ongoing intermittent exertional dyspnea and chest discomfort-overall stable.  He denies palpitations, PND, orthopnea, dizziness, syncope, edema, or early satiety.  All other systems reviewed and are otherwise  negative except as noted above.  Physical Exam    VS:  BP (!) 142/86 (BP Location: Left Arm, Patient Position: Sitting, Cuff Size: Normal)   Pulse 84   Ht 6\' 1"  (1.854 m)   Wt 261 lb (118.4 kg)   BMI 34.43 kg/m  , BMI Body mass index is 34.43 kg/m. GEN: Well nourished, well developed, in no acute distress. HEENT: normal. Neck: Supple, no JVD, carotid bruits, or masses. Cardiac: RRR, no murmurs, rubs, or gallops. No clubbing, cyanosis, edema.  Radials/DP/PT 2+ and equal bilaterally.  Respiratory:  Respirations regular and unlabored, clear to auscultation bilaterally. GI: Soft, nontender, nondistended, BS + x 4. MS: no deformity or atrophy. Skin: warm and dry, no rash. Neuro:  Strength and sensation are intact. Psych: Normal affect.  Accessory Clinical Findings    ECG personally reviewed by me today -regular sinus rhythm, 84, left axis deviation,  no acute changes.  Assessment & Plan    1.  Chronic combined systolic and diastolic congestive heart failure/ischemic cardiomyopathy: Patient has been relatively stable  with chronic dyspnea on exertion that is unchanged in frequency.  His weight is up since his last visit however he has not been weighing himself at home.  His last echo in September showed some improvement in LV dysfunction with an EF of 40 to 45%.  He is sedentary and does not pay much attention to his caloric intake.  He is euvolemic on exam today.  He is on beta-blocker, Entresto, and spironolactone.  He notes today that he is only taking spironolactone or Lasix a few days a week.  I advised that although we can transition him to Lasix on a as needed basis (based on weight), I would recommend that he take Spironolactone daily.  I will follow-up a basic metabolic panel today.  We discussed the importance of daily weights, sodium restriction, medication compliance, and symptom reporting and he verbalizes understanding.   2.  Coronary artery disease/stable angina: Patient with severe coronary artery disease that is medically managed in the setting of poor candidacy for bypass and stenting.  He has exertional chest discomfort a few times a month, usually when toting something up the stairs.  He is on isosorbide mononitrate 30 mg daily I will increase this to 60 mg daily.  He otherwise remains on aspirin, statin, beta-blocker, Plavix, Zetia.  3.  Essential hypertension: Blood pressure mildly elevated today.  As above, I have recommended that he take spironolactone daily.  Also titrating isosorbide mononitrate in the setting of exertional angina.  4.  Hyperlipidemia: LDL was 108 in October of last year.  He is currently on Lipitor and Zetia and will need follow-up lipids and LFTs when fasting.  5.  Ongoing tobacco abuse: He still smokes a pack a day.  He has no desire to quit.  Complete cessation advised.  6.  Disposition: Follow-up basic metabolic panel today.  Follow-up in clinic in 3 months or sooner if necessary.  Nicolasa Ducking, NP 04/25/2018, 4:40 PM

## 2018-04-26 LAB — BASIC METABOLIC PANEL
BUN / CREAT RATIO: 12 (ref 9–20)
BUN: 12 mg/dL (ref 6–24)
CALCIUM: 9.6 mg/dL (ref 8.7–10.2)
CHLORIDE: 101 mmol/L (ref 96–106)
CO2: 21 mmol/L (ref 20–29)
CREATININE: 1.04 mg/dL (ref 0.76–1.27)
GFR calc Af Amer: 96 mL/min/{1.73_m2} (ref >59)
GFR calc non Af Amer: 83 mL/min/{1.73_m2} (ref >59)
GLUCOSE: 121 mg/dL — AB (ref 65–99)
Potassium: 4.3 mmol/L (ref 3.5–5.2)
Sodium: 138 mmol/L (ref 134–144)

## 2018-05-17 ENCOUNTER — Other Ambulatory Visit: Payer: Self-pay | Admitting: Family

## 2018-05-20 ENCOUNTER — Other Ambulatory Visit: Payer: Self-pay | Admitting: Family

## 2018-05-20 MED ORDER — ATORVASTATIN CALCIUM 80 MG PO TABS: 80.0000 mg | ORAL_TABLET | Freq: Every day | ORAL | 3 refills | Status: AC

## 2018-06-21 ENCOUNTER — Other Ambulatory Visit: Payer: Self-pay | Admitting: Family

## 2018-08-01 ENCOUNTER — Ambulatory Visit: Payer: Medicaid Other | Admitting: Internal Medicine

## 2018-08-06 ENCOUNTER — Telehealth: Payer: Self-pay

## 2018-08-06 NOTE — Telephone Encounter (Signed)
Call to patient to discuss upcoming appt on Thursday. After reviewing chart and speaking with sx, he does not have any new or worsening sx at this time. He would like to reschedule when available.   He did endorse continued SOBOE. No new chest pain or swelling. He reports compliance with medications. And denies weight gain. He does not have recent vs as he denies taking them at home.   I will cancel appt at this time and route to pool for rescheduling.   Advised pt to call for any further questions or concerns

## 2018-08-08 ENCOUNTER — Ambulatory Visit: Payer: Medicaid Other | Admitting: Nurse Practitioner

## 2018-08-08 ENCOUNTER — Ambulatory Visit: Payer: Medicaid Other | Admitting: Family

## 2018-08-14 NOTE — Telephone Encounter (Signed)
Patient denied evisit at this time - would prefer in office visit  Would like to wait a week or so to see how he is doing  If patient does change his mind he will call back to schedule evisit

## 2018-08-20 ENCOUNTER — Other Ambulatory Visit: Payer: Self-pay | Admitting: Internal Medicine

## 2018-08-29 NOTE — Telephone Encounter (Signed)
Recall placed for august  

## 2019-02-09 NOTE — Progress Notes (Signed)
Follow-up Outpatient Visit Date: 02/10/2019  Primary Care Provider: Martie Round, NP [address] [place] [address]  Chief Complaint: Follow-up coronary artery disease and heart failure  HPI:  Bradley Bridges is a 53 y.o. year-old male with history of multivessel coronary artery disease not amenable to CABG or PCI, chronic systolic heart failure due to ischemic cardiomyopathy,hypertension, obesity, and tobacco/alcohol abuse, who presents for follow-up of CAD and cardiomyopathy.  He was last seen by Bradley Givens, NP in 04/2018, at which time he reported stable dyspnea on exertion.  He also noted occasional exertional chest pain, usually when toting things up the stairs.  Isosorbide mononitrate was increased from 30 -> 60 mg daily.  Smoking cessation was also recommended.  Today, Bradley Bridges reports that he stopped taking isosorbide mononitrate about a month ago because it was making him feel lightheaded.  In fact, he is stopped all of his medications and has been gradually adding them back in a stepwise fashion.  He is now taking all of his medications again with the exception of isosorbide mononitrate.  Since his last visit, he has noticed increasing exertional chest pain with light activity.  He took a shower last night, and after 10 minutes, felt some tightness in his chest.  This resolved with resting.  He has stable exertional dyspnea with minimal activity.  He denies palpitations, lightheadedness, and edema.  He admits that he is eating more salt than he should.  Bradley Bridges is recovering from a recent gout flare for which he has been using up to 4 gm of acetaminophen a day.  He was previously prescribed colchicine but says that the cost has been somewhat prohibitive despite being on Medicaid and typically only paying $3 per prescription.  For some reason colchicine is more expensive.  He remains on aspirin and clopidogrel without  bleeding.  --------------------------------------------------------------------------------------------------  Past Medical History:  Diagnosis Date   Chronic combined systolic (congestive) and diastolic (congestive) heart failure (HCC)    a. 02/2017 Echo: EF 20-25%, diff HK, mild MR, mildly dil RA, mild TR; b. 01/2018 Echo: EF 40-45%, diff HK, Gr1 DD, mild MR, mildly dil LA. Nl RV fxn.   Coronary artery disease    a. 02/2017 NSTEMI/Cath: LM 25, LAD 25ost, 70p, 79m, 70/90d, D1/2 mod dzs, RI 80, LCX 79m, OM1 small, OM2 mod dzs, RCA 100p, RPDA fills via L->R collats-->Not felt to be a good surgical candidate-->Med Rx; b. 03/2017 NSTEMI/Cath: LM 25, LAD 25ost, 70p, 41m, 70/90d, D1 70ost, D2 80ost, RI 80, LCX 80p->d, OM1 90, RCA 100p, RPDA fills from L->R collats-->Med Rx.   ETOH abuse    GERD (gastroesophageal reflux disease)    a. x 20 yrs   Gout    Hypertension    a. x 20 yrs - no meds since 2017.   Ischemic cardiomyopathy    a. 02/2017 Echo: EF 20-25%, diff HK; b. 01/2018 Echo: EF 40-45%, diff HK.   NSTEMI (non-ST elevated myocardial infarction) (HCC)    a. 02/2017--sev 3VD-->turned down for CABG-->Med Rx.   Obesity    Tobacco abuse    Past Surgical History:  Procedure Laterality Date   LEFT HEART CATH AND CORONARY ANGIOGRAPHY N/A 02/27/2017   Procedure: LEFT HEART CATH AND CORONARY ANGIOGRAPHY;  Surgeon: Yvonne Kendall, MD;  Location: [facility] INVASIVE CV LAB;  Service: Cardiovascular;  Laterality: N/A;   LEFT HEART CATH AND CORONARY ANGIOGRAPHY N/A 04/09/2017   Procedure: LEFT HEART CATH AND CORONARY ANGIOGRAPHY;  Surgeon: Iran Ouch,  MD;  Location: [facility] INVASIVE CV LAB;  Service: Cardiovascular;  Laterality: N/A;   TOOTH EXTRACTION      Current Meds  Medication Sig   acetaminophen (TYLENOL) 500 MG tablet Take 1,000 mg by mouth every 4 (four) hours as needed.   allopurinol (ZYLOPRIM) 300 MG tablet Take 300 mg by mouth daily.   aspirin 81 MG EC tablet Take 1  tablet (81 mg total) by mouth daily.   atorvastatin (LIPITOR) 80 MG tablet Take 1 tablet (80 mg total) by mouth daily at 6 PM.   clopidogrel (PLAVIX) 75 MG tablet TAKE 1 TABLET BY MOUTH ONCE DAILY   colchicine 0.6 MG tablet Take 0.6 mg by mouth as needed.    ezetimibe (ZETIA) 10 MG tablet Take 1 tablet (10 mg total) by mouth daily.   furosemide (LASIX) 20 MG tablet Take 1 tablet (20 mg total) by mouth daily.   nitroGLYCERIN (NITROSTAT) 0.4 MG SL tablet Place 1 tablet (0.4 mg total) under the tongue every 5 (five) minutes x 3 doses as needed for chest pain.   omeprazole (PRILOSEC) 20 MG capsule TAKE 1 CAPSULE BY MOUTH ONCE DAILY   sacubitril-valsartan (ENTRESTO) 97-103 MG Take 1 tablet by mouth 2 (two) times daily.   spironolactone (ALDACTONE) 25 MG tablet TAKE 1 TABLET BY MOUTH ONCE DAILY   [DISCONTINUED] carvedilol (COREG) 25 MG tablet Take 1 tablet by mouth twice daily   [DISCONTINUED] isosorbide mononitrate (IMDUR) 60 MG 24 hr tablet Take 1 tablet (60 mg total) by mouth daily.    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  Died in his 6850's   Other Father        liver failure   Diabetes Sister    CAD Brother        s/p stenting    Review of Systems: A 12-system review of systems was performed and was negative except as noted in the HPI.  --------------------------------------------------------------------------------------------------  Physical Exam: BP (!) 190/112 (BP Location: Left  Arm, Patient Position: Sitting, Cuff Size: Normal)    Pulse 97    Ht 6\' 1"  (1.854 m)    Wt 253 lb 8 oz (115 kg)    SpO2 98%    BMI 33.45 kg/m   Repeat BP: 186/100  General: NAD. HEENT: No conjunctival pallor or scleral icterus.  Facemask in place. Neck: Supple without lymphadenopathy, thyromegaly, JVD, or HJR. Lungs: Normal work of breathing. Clear to auscultation bilaterally without wheezes or crackles. Heart: Regular rate and rhythm without murmurs, rubs, or gallops.  Unable to assess PMI due to body habitus. Abd: Bowel sounds present. Soft, NT/ND.  Unable to assess HSM due to body habitus. Ext: No lower extremity edema. Radial, PT, and DP pulses are 2+ bilaterally. Skin: Warm and dry without rash.  EKG: Normal sinus rhythm with PACs and nonspecific T wave changes.  Lab Results  Component Value Date   WBC 10.0 04/10/2017   HGB 12.9 (L) 04/10/2017   HCT 37.6 (L) 04/10/2017   MCV 87.3 04/10/2017   PLT 376 04/10/2017    Lab Results  Component Value Date   NA 138 04/25/2018   K 4.3 04/25/2018   CL 101 04/25/2018   CO2 21 04/25/2018   BUN 12 04/25/2018   CREATININE 1.04 04/25/2018   GLUCOSE 121 (H) 04/25/2018   ALT 21 06/01/2017    Lab Results  Component Value Date   CHOL 196 02/26/2017   HDL 44 02/26/2017   LDLCALC 108 (H) 02/26/2017   TRIG 218 (H) 02/26/2017   CHOLHDL 4.5 02/26/2017    --------------------------------------------------------------------------------------------------  ASSESSMENT AND PLAN: Coronary artery disease with stable angina: Bradley Bridges has severe multivessel CAD that is not well suited to CABG or bypass due to its diffuse nature.  We will continue with medical therapy.  We discussed restarting isosorbide mononitrate, but Bradley Bridges is reluctant to do this due to lightheadedness on 60 mg daily.  He notes some improvement in his angina at lower doses.  Given his incompletely suppressed heart rate, we have instead agreed to increase carvedilol to  50 mg twice daily for blood pressure, heart rate, and anginal treatment.  We will continue indefinite dual antiplatelet therapy and high intensity statin therapy.  I will check a CBC, CMP, and fasting lipid panel today for routine monitoring.  Chronic systolic and diastolic heart failure due to ischemic cardiomyopathy: Bradley Bridges appears euvolemic and well compensated and is actually down 8 pounds since December.  I encouraged him to minimize his sodium intake.  We will continue current doses of Entresto as well as spironolactone and furosemide.  As above, we will escalate carvedilol to 50 mg twice daily.  I will check a complete metabolic panel today to ensure stable renal function and potassium.  Hyperlipidemia: Continue high intensity statin therapy.  Check fasting lipid panel and CMP today.  Hypertension: Poorly controlled blood pressure today.  Some of this is likely due to dietary indiscretion and medication noncompliance.  I encouraged Bradley Bridges to take all of his medications as prescribed and to not self titrate/discontinue agents.  As above, we will increase carvedilol.  Continue current doses of spironolactone and Entresto.  Gout: Suboptimally controlled, requiring up to 4 gm of acetaminophen per day.  I encouraged Bradley Bridges to use colchicine, if possible.  He should speak with his PCP, pharmacy, and insurance to ascertain why the cost is higher than his other medications.  He should also discuss role for continuation of allopurinol with his PCP, as he does feel like it is working.  Follow-up: Virtual visit in 1 month.  Nelva Bush, MD 02/10/2019 11:28 AM

## 2019-02-10 ENCOUNTER — Other Ambulatory Visit: Payer: Self-pay | Admitting: Internal Medicine

## 2019-02-10 ENCOUNTER — Encounter: Payer: Self-pay | Admitting: Internal Medicine

## 2019-02-10 ENCOUNTER — Other Ambulatory Visit: Payer: Self-pay | Admitting: Family

## 2019-02-10 ENCOUNTER — Other Ambulatory Visit: Payer: Self-pay

## 2019-02-10 ENCOUNTER — Ambulatory Visit (INDEPENDENT_AMBULATORY_CARE_PROVIDER_SITE_OTHER): Payer: Medicaid Other | Admitting: Internal Medicine

## 2019-02-10 VITALS — BP 190/112 | HR 97 | Ht 73.0 in | Wt 253.5 lb

## 2019-02-10 DIAGNOSIS — I5042 Chronic combined systolic (congestive) and diastolic (congestive) heart failure: Secondary | ICD-10-CM

## 2019-02-10 DIAGNOSIS — M1A09X0 Idiopathic chronic gout, multiple sites, without tophus (tophi): Secondary | ICD-10-CM

## 2019-02-10 DIAGNOSIS — I10 Essential (primary) hypertension: Secondary | ICD-10-CM | POA: Diagnosis not present

## 2019-02-10 DIAGNOSIS — I25118 Atherosclerotic heart disease of native coronary artery with other forms of angina pectoris: Secondary | ICD-10-CM

## 2019-02-10 DIAGNOSIS — E785 Hyperlipidemia, unspecified: Secondary | ICD-10-CM | POA: Diagnosis not present

## 2019-02-10 MED ORDER — CARVEDILOL 25 MG PO TABS: 50.0000 mg | ORAL_TABLET | Freq: Two times a day (BID) | ORAL | 1 refills | Status: AC

## 2019-02-10 NOTE — Patient Instructions (Addendum)
Medication Instructions:  Your physician has recommended you make the following change in your medication:  1- INCREASE Carvedilol to 50 mg (2 tablets) by mouth two times a day.  If you need a refill on your cardiac medications before your next appointment, please call your pharmacy.   Lab work: Your physician recommends that you return for lab work in: TODAY (CBC NO DIFF, CMP, FASTING LIPID).  If you have labs (blood work) drawn today and your tests are completely normal, you will receive your results only by: Marland Kitchen MyChart Message (if you have MyChart) OR . A paper copy in the mail If you have any lab test that is abnormal or we need to change your treatment, we will call you to review the results.  Testing/Procedures: NONE  Follow-Up: At [facility], you and your health needs are our priority.  As part of our continuing mission to provide you with exceptional heart care, we have created designated Provider Care Teams.  These Care Teams include your primary Cardiologist (physician) and Advanced Practice Providers (APPs -  Physician Assistants and Nurse Practitioners) who all work together to provide you with the care you need, when you need it. You will need a follow up appointment in 1 months as VIRTUAL VISIT WITH DR END OR APP.   You may see Nelva Bush, MD or one of the following Advanced Practice Providers on your designated Care Team:   Murray Hodgkins, NP Christell Faith, PA-C . Marrianne Mood, PA-C

## 2019-02-11 ENCOUNTER — Other Ambulatory Visit: Payer: Self-pay | Admitting: *Deleted

## 2019-02-11 LAB — COMPREHENSIVE METABOLIC PANEL
ALT: 15 IU/L (ref 0–44)
AST: 19 IU/L (ref 0–40)
Albumin/Globulin Ratio: 1.9 (ref 1.2–2.2)
Albumin: 4.8 g/dL (ref 3.8–4.9)
Alkaline Phosphatase: 48 IU/L (ref 39–117)
BUN/Creatinine Ratio: 14 (ref 9–20)
BUN: 15 mg/dL (ref 6–24)
Bilirubin Total: 0.4 mg/dL (ref 0.0–1.2)
CO2: 20 mmol/L (ref 20–29)
Calcium: 10.2 mg/dL (ref 8.7–10.2)
Chloride: 104 mmol/L (ref 96–106)
Creatinine, Ser: 1.08 mg/dL (ref 0.76–1.27)
GFR calc Af Amer: 91 mL/min/{1.73_m2} (ref >59)
GFR calc non Af Amer: 78 mL/min/{1.73_m2} (ref >59)
Globulin, Total: 2.5 g/dL (ref 1.5–4.5)
Glucose: 87 mg/dL (ref 65–99)
Potassium: 4.5 mmol/L (ref 3.5–5.2)
Sodium: 140 mmol/L (ref 134–144)
Total Protein: 7.3 g/dL (ref 6.0–8.5)

## 2019-02-11 LAB — LIPID PANEL
Chol/HDL Ratio: 5.2 ratio — ABNORMAL HIGH (ref 0.0–5.0)
Cholesterol, Total: 166 mg/dL (ref 100–199)
HDL: 32 mg/dL — ABNORMAL LOW (ref >39)
LDL Chol Calc (NIH): 94 mg/dL (ref 0–99)
Triglycerides: 238 mg/dL — ABNORMAL HIGH (ref 0–149)
VLDL Cholesterol Cal: 40 mg/dL (ref 5–40)

## 2019-02-11 LAB — CBC
Hematocrit: 48.2 % (ref 37.5–51.0)
Hemoglobin: 16.2 g/dL (ref 13.0–17.7)
MCH: 30.3 pg (ref 26.6–33.0)
MCHC: 33.6 g/dL (ref 31.5–35.7)
MCV: 90 fL (ref 79–97)
Platelets: 413 10*3/uL (ref 150–450)
RBC: 5.35 x10E6/uL (ref 4.14–5.80)
RDW: 13 % (ref 11.6–15.4)
WBC: 7.7 10*3/uL (ref 3.4–10.8)

## 2019-02-11 MED ORDER — CLOPIDOGREL BISULFATE 75 MG PO TABS: 75.0000 mg | ORAL_TABLET | Freq: Every day | ORAL | 3 refills | Status: AC

## 2019-02-17 ENCOUNTER — Telehealth: Payer: Self-pay | Admitting: *Deleted

## 2019-02-17 ENCOUNTER — Other Ambulatory Visit: Payer: Self-pay | Admitting: Internal Medicine

## 2019-02-17 NOTE — Telephone Encounter (Signed)
Pt is not on Xarelto - but is on Entresto 97-103 mg.

## 2019-02-17 NOTE — Telephone Encounter (Signed)
    Virtual Visit Pre-Appointment Phone Call  "(Name), I am calling you today to discuss your upcoming appointment. We are currently trying to limit exposure to the virus that causes COVID-19 by seeing patients at home rather than in the office."  1. "What is the BEST phone number to call the day of the visit?" - include this in appointment notes  2. "Do you have or have access to (through a family member/friend) a smartphone with video capability that we can use for your visit?"  a. If no - list the appointment type as a PHONE visit in appointment notes  3. Confirm consent - "In the setting of the current Covid19 crisis, you are scheduled for a (phone or video) visit with your provider on (date) at (time).  Just as we do with many in-office visits, in order for you to participate in this visit, we must obtain consent.  If you'd like, I can send this to your mychart (if signed up) or email for you to review.  Otherwise, I can obtain your verbal consent now.  All virtual visits are billed to your insurance company just like a normal visit would be.  By agreeing to a virtual visit, we'd like you to understand that the technology does not allow for your provider to perform an examination, and thus may limit your provider's ability to fully assess your condition. If your provider identifies any concerns that need to be evaluated in person, we will make arrangements to do so.  Finally, though the technology is pretty good, we cannot assure that it will always work on either your or our end, and in the setting of a video visit, we may have to convert it to a phone-only visit.  In either situation, we cannot ensure that we have a secure connection.  Are you willing to proceed?" STAFF: Did the patient verbally acknowledge consent to telehealth visit? Document YES/NO here: YES  4. Advise patient to be prepared - "Two hours prior to your appointment, go ahead and check your blood pressure, pulse, oxygen  saturation, and your weight (if you have the equipment to check those) and write them all down. When your visit starts, your provider will ask you for this information. If you have an Apple Watch or Kardia device, please plan to have heart rate information ready on the day of your appointment. Please have a pen and paper handy nearby the day of the visit as well."  5. Give patient instructions for MyChart download to smartphone OR [email or web address] as below if video visit (depending on what platform provider is using)  6. Inform patient they will receive a phone call 15 minutes prior to their appointment time (may be from unknown caller ID) so they should be prepared to answer    TELEPHONE CALL NOTE  Bradley Bridges has been deemed a candidate for a follow-up tele-health visit to limit community exposure during the Covid-19 pandemic. I spoke with the patient via phone to ensure availability of phone/video source, confirm preferred email & phone number, and discuss instructions and expectations.  I reminded Bradley Bridges to be prepared with any vital sign and/or heart rhythm information that could potentially be obtained via home monitoring, at the time of his visit. I reminded Bradley Bridges to expect a phone call prior to his visit.  Bradley Jaffe, RN 02/17/2019 2:10 PM

## 2019-02-17 NOTE — Telephone Encounter (Signed)
Patient calling States [facility] did not have refill of Xarelto when picking up medications - states may need approval Please advise / resend - please call patient when refill sent    *STAT* If patient is at the pharmacy, call can be transferred to refill team.   1. Which medications need to be refilled? (please list name of each medication and dose if known) Xarelto 97.3-103 MG  2. Which pharmacy/location (including street and city if local pharmacy) is medication to be sent to? [place]  3. Do they need a 30 day or 90 day supply? 90 day

## 2019-02-18 NOTE — Telephone Encounter (Signed)
Call to patient, no answer.  Made call to [facility], they do not have xarelto on his list and said the medication they were processing is Entresto.

## 2019-02-18 NOTE — Telephone Encounter (Signed)
Patient says a prior Josem Kaufmann is needed please call when family can go get meds   Leave detailed msg if no answer

## 2019-02-19 NOTE — Telephone Encounter (Signed)
Fax sent 02/19/19, [facility] for Entresto 97/103 mg BID. Paramedic # [phone]) [place]

## 2019-02-25 NOTE — Telephone Encounter (Signed)
[place] and she ran the [facility] and it is showing approved. They will notify the patient.

## 2019-03-12 ENCOUNTER — Ambulatory Visit: Payer: Medicaid Other | Admitting: Internal Medicine

## 2019-03-27 ENCOUNTER — Telehealth: Payer: Medicaid Other | Admitting: Internal Medicine

## 2019-04-03 ENCOUNTER — Other Ambulatory Visit: Payer: Self-pay

## 2019-04-03 ENCOUNTER — Telehealth: Payer: Medicaid Other | Admitting: Internal Medicine

## 2019-04-03 ENCOUNTER — Telehealth (INDEPENDENT_AMBULATORY_CARE_PROVIDER_SITE_OTHER): Payer: Medicaid Other | Admitting: Internal Medicine

## 2019-04-03 NOTE — Progress Notes (Signed)
No show

## 2019-04-07 ENCOUNTER — Other Ambulatory Visit: Payer: Self-pay | Admitting: Internal Medicine

## 2019-04-07 ENCOUNTER — Telehealth: Payer: Self-pay

## 2019-04-07 MED ORDER — ENTRESTO 97-103 MG PO TABS: 1.0000 | ORAL_TABLET | Freq: Two times a day (BID) | ORAL | 0 refills | Status: AC

## 2019-04-07 NOTE — Telephone Encounter (Signed)
Requested Prescriptions   Signed Prescriptions Disp Refills  . sacubitril-valsartan (ENTRESTO) 97-103 MG 60 tablet 0    Sig: Take 1 tablet by mouth 2 (two) times daily. *PLEASE CALL TO SCHEDULE OFFICE VISIT FOR FURTHER REFILLS*    Authorizing Provider: END, CHRISTOPHER    Ordering User: NEWCOMER MCCLAIN, Areen Trautner L

## 2019-05-23 DEATH — deceased

## 2019-07-15 IMAGING — DX DG CHEST 1V PORT
1 series · 1 of 1 positions shown · non-contrast
Comparison: Portable exam 3950 hours compared to 05/02/2014

CLINICAL DATA: Shortness of breath, chest pain, tachycardia

EXAM:
PORTABLE CHEST 1 VIEW

[chest ap]
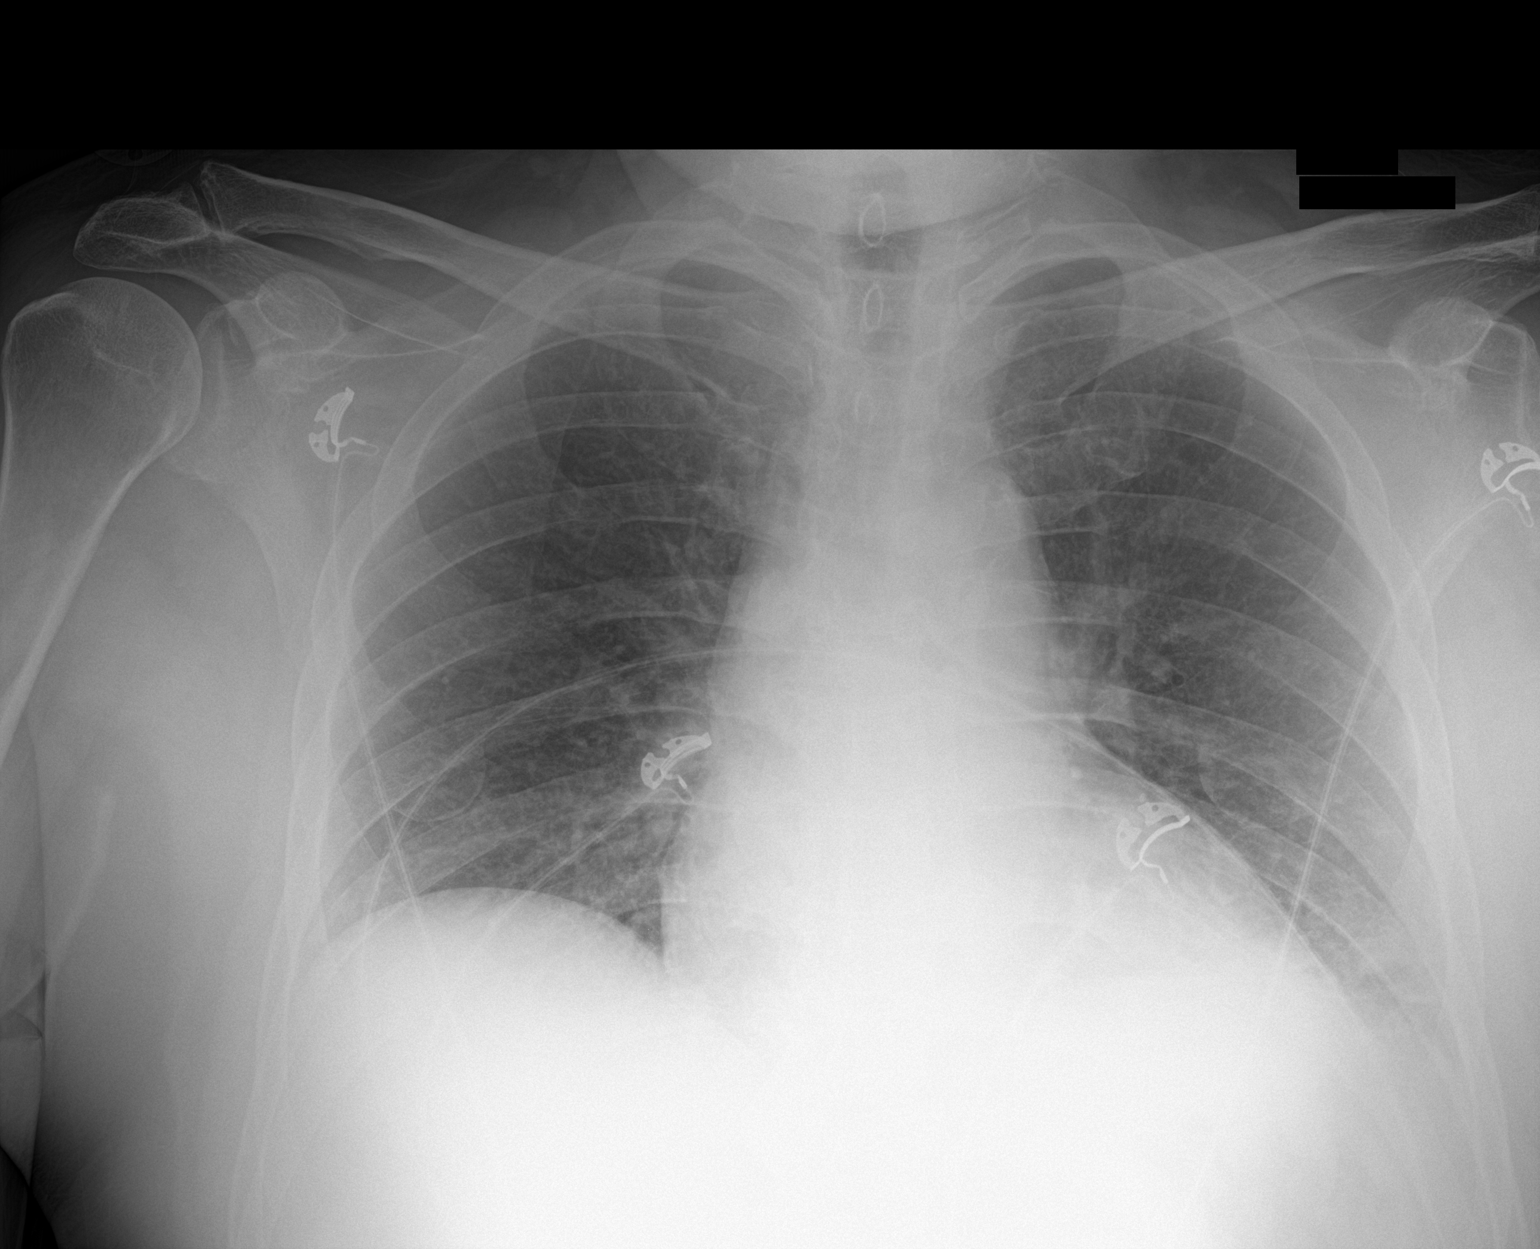

[1 of 1 positions shown; findings below may reference images not displayed]

FINDINGS: Normal heart size, mediastinal contours, and pulmonary vascularity.

LEFT basilar atelectasis.

Lungs otherwise clear.

No pleural effusion or pneumothorax.

Bones demineralized.
IMPRESSION: Mild LEFT basilar atelectasis.
